# Patient Record
Sex: Male | Born: 1965 | Race: Black or African American | Hispanic: No | Marital: Married | Smoking: Current some day smoker
Health system: Southern US, Community
[De-identification: ages and names within clinical notes are randomized; demographics above are authoritative.]

## PROBLEM LIST (undated history)

## (undated) DIAGNOSIS — R9431 Abnormal electrocardiogram [ECG] [EKG]: Secondary | ICD-10-CM

## (undated) DIAGNOSIS — I10 Essential (primary) hypertension: Secondary | ICD-10-CM

## (undated) DIAGNOSIS — S8262XA Displaced fracture of lateral malleolus of left fibula, initial encounter for closed fracture: Secondary | ICD-10-CM

## (undated) DIAGNOSIS — I2510 Atherosclerotic heart disease of native coronary artery without angina pectoris: Secondary | ICD-10-CM

## (undated) DIAGNOSIS — I160 Hypertensive urgency: Secondary | ICD-10-CM

## (undated) DIAGNOSIS — I200 Unstable angina: Secondary | ICD-10-CM

## (undated) DIAGNOSIS — I249 Acute ischemic heart disease, unspecified: Secondary | ICD-10-CM

## (undated) DIAGNOSIS — Z720 Tobacco use: Secondary | ICD-10-CM

## (undated) DIAGNOSIS — G4730 Sleep apnea, unspecified: Secondary | ICD-10-CM

## (undated) DIAGNOSIS — R079 Chest pain, unspecified: Secondary | ICD-10-CM

## (undated) DIAGNOSIS — E7800 Pure hypercholesterolemia, unspecified: Secondary | ICD-10-CM

## (undated) DIAGNOSIS — E669 Obesity, unspecified: Secondary | ICD-10-CM

## (undated) DIAGNOSIS — E1169 Type 2 diabetes mellitus with other specified complication: Secondary | ICD-10-CM

## (undated) HISTORY — DX: Essential (primary) hypertension: I10

## (undated) HISTORY — DX: Acute ischemic heart disease, unspecified: I24.9

## (undated) HISTORY — DX: Tobacco use: Z72.0

## (undated) HISTORY — DX: Displaced fracture of lateral malleolus of left fibula, initial encounter for closed fracture: S82.62XA

## (undated) HISTORY — DX: Obesity, unspecified: E66.9

## (undated) HISTORY — DX: Hypertensive urgency: I16.0

## (undated) HISTORY — DX: Chest pain, unspecified: R07.9

## (undated) HISTORY — DX: Type 2 diabetes mellitus with other specified complication: E11.69

## (undated) HISTORY — DX: Abnormal electrocardiogram (ECG) (EKG): R94.31

## (undated) HISTORY — DX: Unstable angina: I20.0

## (undated) HISTORY — DX: Atherosclerotic heart disease of native coronary artery without angina pectoris: I25.10

---

## 2007-04-06 ENCOUNTER — Emergency Department (HOSPITAL_COMMUNITY): Admission: EM | Admit: 2007-04-06 | Discharge: 2007-04-06 | Payer: Self-pay | Admitting: Emergency Medicine

## 2009-02-18 ENCOUNTER — Encounter: Payer: Self-pay | Admitting: Cardiovascular Disease

## 2011-03-16 LAB — URINALYSIS, ROUTINE W REFLEX MICROSCOPIC
Bilirubin Urine: NEGATIVE
Glucose, UA: NEGATIVE
Hgb urine dipstick: NEGATIVE
Ketones, ur: NEGATIVE
Nitrite: NEGATIVE
Protein, ur: NEGATIVE
Specific Gravity, Urine: 1.018
Urobilinogen, UA: 1
pH: 7

## 2012-07-28 ENCOUNTER — Emergency Department (HOSPITAL_COMMUNITY)
Admission: EM | Admit: 2012-07-28 | Discharge: 2012-07-28 | Disposition: A | Discharge status: Home / Self Care | Payer: PRIVATE HEALTH INSURANCE | Attending: Emergency Medicine | Admitting: Emergency Medicine

## 2012-07-28 ENCOUNTER — Encounter (HOSPITAL_COMMUNITY): Payer: Self-pay

## 2012-07-28 ENCOUNTER — Emergency Department (HOSPITAL_COMMUNITY): Payer: PRIVATE HEALTH INSURANCE

## 2012-07-28 DIAGNOSIS — I10 Essential (primary) hypertension: Secondary | ICD-10-CM | POA: Insufficient documentation

## 2012-07-28 DIAGNOSIS — S82402A Unspecified fracture of shaft of left fibula, initial encounter for closed fracture: Secondary | ICD-10-CM

## 2012-07-28 DIAGNOSIS — E119 Type 2 diabetes mellitus without complications: Secondary | ICD-10-CM | POA: Insufficient documentation

## 2012-07-28 DIAGNOSIS — S82899A Other fracture of unspecified lower leg, initial encounter for closed fracture: Secondary | ICD-10-CM | POA: Insufficient documentation

## 2012-07-28 DIAGNOSIS — F17200 Nicotine dependence, unspecified, uncomplicated: Secondary | ICD-10-CM | POA: Insufficient documentation

## 2012-07-28 MED ORDER — OXYCODONE-ACETAMINOPHEN 5-325 MG PO TABS: 2.0000 | ORAL_TABLET | ORAL | Status: DC | PRN

## 2012-07-28 MED ORDER — ONDANSETRON HCL 4 MG/2ML IJ SOLN
4.0000 mg | Freq: Once | INTRAMUSCULAR | Status: AC
  Administered 2012-07-28: 4 mg via INTRAVENOUS
  Filled 2012-07-28: qty 2

## 2012-07-28 MED ORDER — HYDROMORPHONE HCL PF 1 MG/ML IJ SOLN
1.0000 mg | Freq: Once | INTRAMUSCULAR | Status: AC
  Administered 2012-07-28: 1 mg via INTRAVENOUS
  Filled 2012-07-28: qty 1

## 2012-07-28 MED ORDER — SODIUM CHLORIDE 0.9 % IV BOLUS (SEPSIS)
1000.0000 mL | Freq: Once | INTRAVENOUS | Status: AC
  Administered 2012-07-28: 1000 mL via INTRAVENOUS
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
## 2012-07-28 NOTE — ED Notes (Signed)
Ortho at bedside to apply posterior short leg splint.  Pt premedicated with 1mg  Dilaudid and 4mg  Zofran IV prior to procedure.

## 2012-07-28 NOTE — ED Notes (Signed)
X-ray at bedside

## 2012-07-28 NOTE — ED Notes (Signed)
Pt alert and oriented x4. Respirations even and unlabored. Bilateral rise and fall of chest. Skin warm and dry. In no acute distress. Denies needs.  

## 2012-07-28 NOTE — Discharge Instructions (Signed)
Cast or Splint Care Casts and splints support injured limbs and keep bones from moving while they heal.  HOME CARE  Keep the cast or splint uncovered during the drying period.  A plaster cast can take 24 to 48 hours to dry.  A fiberglass cast will dry in less than 1 hour.  Do not rest the cast on anything harder than a pillow for 24 hours.  Do not put weight on your injured limb. Do not put pressure on the cast. Wait for your doctor's approval.  Keep the cast or splint dry.  Cover the cast or splint with a plastic bag during baths or wet weather.  If you have a cast over your chest and belly (trunk), take sponge baths until the cast is taken off.  Keep your cast or splint clean. Wash a dirty cast with a damp cloth.  Do not put any objects under your cast or splint. Do not scratch the skin under the cast with an object.  Do not take out the padding from inside your cast.  Exercise your joints near the cast as told by your doctor.  Raise (elevate) your injured limb on 1 or 2 pillows for the first 1 to 3 days. GET HELP RIGHT AWAY IF:  Your cast or splint cracks.  Your cast or splint is too tight or too loose.  You itch badly under the cast.  Your cast gets wet or has a soft spot.  You have a bad smell coming from the cast.  You get an object stuck under the cast.  Your skin around the cast becomes red or raw.  You have new or more pain after the cast is put on.  You have fluid leaking through the cast.  You cannot move your fingers or toes.  Your fingers or toes turn colors or are cool, painful, or puffy (swollen).  You have tingling or lose feeling (numbness) around the injured area.  You have pain or pressure under the cast.  You have trouble breathing or have shortness of breath.  You have chest pain. MAKE SURE YOU:  Understand these instructions.  Will watch your condition.  Will get help right away if you are not doing well or get worse. Document  Released: 09/22/2010 Document Revised: 08/15/2011 Document Reviewed: 09/22/2010 [facility] Patient Information 2013 [place], [place].   Splint, crutches, elevation, ice, pain meds. Call the orthopedic surgeon within the week for a followup appointment. You may need surgery for your fracture

## 2012-07-28 NOTE — ED Notes (Signed)
Ortho Tech at bedside.  

## 2012-07-28 NOTE — ED Notes (Signed)
Bed:WA22<BR> Expected date:<BR> Expected time:<BR> Means of arrival:<BR> Comments:<BR> EMS

## 2012-08-02 ENCOUNTER — Other Ambulatory Visit (HOSPITAL_COMMUNITY): Payer: Self-pay | Admitting: Orthopaedic Surgery

## 2012-08-06 ENCOUNTER — Encounter (HOSPITAL_COMMUNITY): Payer: Self-pay | Admitting: *Deleted

## 2012-08-06 ENCOUNTER — Encounter (HOSPITAL_COMMUNITY): Payer: Self-pay | Admitting: Pharmacy Technician

## 2012-08-06 MED ORDER — CEFAZOLIN SODIUM-DEXTROSE 2-3 GM-% IV SOLR
2.0000 g | INTRAVENOUS | Status: AC
  Administered 2012-08-07: 2 g via INTRAVENOUS
  Filled 2012-08-06: qty 50

## 2012-08-07 ENCOUNTER — Encounter (HOSPITAL_COMMUNITY): Payer: Self-pay | Admitting: Anesthesiology

## 2012-08-07 ENCOUNTER — Encounter (HOSPITAL_COMMUNITY): Payer: Self-pay | Admitting: *Deleted

## 2012-08-07 ENCOUNTER — Ambulatory Visit (HOSPITAL_COMMUNITY): Payer: PRIVATE HEALTH INSURANCE

## 2012-08-07 ENCOUNTER — Encounter (HOSPITAL_COMMUNITY)
Admission: RE | Disposition: A | Discharge status: Home / Self Care | Payer: Self-pay | Source: Ambulatory Visit | Attending: Orthopaedic Surgery

## 2012-08-07 ENCOUNTER — Observation Stay (HOSPITAL_COMMUNITY)
Admission: RE | Admit: 2012-08-07 | Discharge: 2012-08-08 | Disposition: A | Discharge status: Home / Self Care | Payer: PRIVATE HEALTH INSURANCE | Source: Ambulatory Visit | Attending: Orthopaedic Surgery | Admitting: Orthopaedic Surgery

## 2012-08-07 ENCOUNTER — Ambulatory Visit (HOSPITAL_COMMUNITY): Payer: PRIVATE HEALTH INSURANCE | Admitting: Anesthesiology

## 2012-08-07 DIAGNOSIS — W19XXXA Unspecified fall, initial encounter: Secondary | ICD-10-CM | POA: Insufficient documentation

## 2012-08-07 DIAGNOSIS — S8262XA Displaced fracture of lateral malleolus of left fibula, initial encounter for closed fracture: Secondary | ICD-10-CM

## 2012-08-07 DIAGNOSIS — S82843A Displaced bimalleolar fracture of unspecified lower leg, initial encounter for closed fracture: Principal | ICD-10-CM | POA: Insufficient documentation

## 2012-08-07 DIAGNOSIS — E119 Type 2 diabetes mellitus without complications: Secondary | ICD-10-CM

## 2012-08-07 DIAGNOSIS — S93439A Sprain of tibiofibular ligament of unspecified ankle, initial encounter: Secondary | ICD-10-CM | POA: Insufficient documentation

## 2012-08-07 DIAGNOSIS — I10 Essential (primary) hypertension: Secondary | ICD-10-CM | POA: Insufficient documentation

## 2012-08-07 HISTORY — PX: ORIF ANKLE FRACTURE: SHX5408

## 2012-08-07 HISTORY — DX: Displaced fracture of lateral malleolus of left fibula, initial encounter for closed fracture: S82.62XA

## 2012-08-07 LAB — CBC
HCT: 39.4 % (ref 39.0–52.0)
Hemoglobin: 14 g/dL (ref 13.0–17.0)
MCH: 30.2 pg (ref 26.0–34.0)
MCHC: 35.5 g/dL (ref 30.0–36.0)
MCV: 84.9 fL (ref 78.0–100.0)
Platelets: 272 10*3/uL (ref 150–400)
RBC: 4.64 MIL/uL (ref 4.22–5.81)
RDW: 14.3 % (ref 11.5–15.5)
WBC: 10.7 10*3/uL — ABNORMAL HIGH (ref 4.0–10.5)

## 2012-08-07 LAB — SURGICAL PCR SCREEN
MRSA, PCR: NEGATIVE
Staphylococcus aureus: POSITIVE — AB

## 2012-08-07 LAB — GLUCOSE, CAPILLARY
Glucose-Capillary: 133 mg/dL — ABNORMAL HIGH (ref 70–99)
Glucose-Capillary: 103 mg/dL — ABNORMAL HIGH (ref 70–99)
Glucose-Capillary: 125 mg/dL — ABNORMAL HIGH (ref 70–99)

## 2012-08-07 LAB — BASIC METABOLIC PANEL
BUN: 16 mg/dL (ref 6–23)
CO2: 26 mEq/L (ref 19–32)
Calcium: 10.3 mg/dL (ref 8.4–10.5)
Chloride: 101 mEq/L (ref 96–112)
Creatinine, Ser: 1.04 mg/dL (ref 0.50–1.35)
GFR calc Af Amer: >90 mL/min (ref >90)
GFR calc non Af Amer: 84 mL/min — ABNORMAL LOW (ref >90)
Glucose, Bld: 134 mg/dL — ABNORMAL HIGH (ref 70–99)
Potassium: 4 mEq/L (ref 3.5–5.1)
Sodium: 139 mEq/L (ref 135–145)

## 2012-08-07 SURGERY — OPEN REDUCTION INTERNAL FIXATION (ORIF) ANKLE FRACTURE
Anesthesia: General | Site: Ankle | Laterality: Left | Wound class: Clean

## 2012-08-07 MED ORDER — BUPIVACAINE HCL (PF) 0.25 % IJ SOLN
INTRAMUSCULAR | Status: AC
  Filled 2012-08-07: qty 30

## 2012-08-07 MED ORDER — ONDANSETRON HCL 4 MG PO TABS: 4.0000 mg | ORAL_TABLET | Freq: Four times a day (QID) | ORAL | Status: DC | PRN

## 2012-08-07 MED ORDER — DIPHENHYDRAMINE HCL 12.5 MG/5ML PO ELIX
12.5000 mg | ORAL_SOLUTION | ORAL | Status: DC | PRN
  Filled 2012-08-07: qty 10

## 2012-08-07 MED ORDER — ZOLPIDEM TARTRATE 5 MG PO TABS: 5.0000 mg | ORAL_TABLET | Freq: Every evening | ORAL | Status: DC | PRN

## 2012-08-07 MED ORDER — SODIUM CHLORIDE 0.9 % IV SOLN
INTRAVENOUS | Status: DC
  Administered 2012-08-07: 20 mL/h via INTRAVENOUS

## 2012-08-07 MED ORDER — HYDROMORPHONE HCL PF 1 MG/ML IJ SOLN
INTRAMUSCULAR | Status: AC
  Filled 2012-08-07: qty 1

## 2012-08-07 MED ORDER — MUPIROCIN 2 % EX OINT
TOPICAL_OINTMENT | Freq: Two times a day (BID) | CUTANEOUS | Status: DC
  Administered 2012-08-07: 12:00:00 via NASAL

## 2012-08-07 MED ORDER — ONDANSETRON HCL 4 MG/2ML IJ SOLN: 4.0000 mg | Freq: Four times a day (QID) | INTRAMUSCULAR | Status: DC | PRN

## 2012-08-07 MED ORDER — HYDROCODONE-ACETAMINOPHEN 5-325 MG PO TABS
1.0000 | ORAL_TABLET | ORAL | Status: DC | PRN
  Administered 2012-08-08 (×2): 2 via ORAL
  Filled 2012-08-07 (×2): qty 2

## 2012-08-07 MED ORDER — PHENYLEPHRINE HCL 10 MG/ML IJ SOLN
INTRAMUSCULAR | Status: DC | PRN
  Administered 2012-08-07 (×6): 80 ug via INTRAVENOUS

## 2012-08-07 MED ORDER — BUPIVACAINE HCL (PF) 0.25 % IJ SOLN
INTRAMUSCULAR | Status: DC | PRN
  Administered 2012-08-07: 10 mL

## 2012-08-07 MED ORDER — GLYCOPYRROLATE 0.2 MG/ML IJ SOLN
INTRAMUSCULAR | Status: DC | PRN
  Administered 2012-08-07: 0.4 mg via INTRAVENOUS

## 2012-08-07 MED ORDER — METHOCARBAMOL 500 MG PO TABS: 500.0000 mg | ORAL_TABLET | Freq: Four times a day (QID) | ORAL | Status: DC | PRN

## 2012-08-07 MED ORDER — METHOCARBAMOL 500 MG PO TABS
ORAL_TABLET | ORAL | Status: AC
  Administered 2012-08-07: 500 mg
  Filled 2012-08-07: qty 1

## 2012-08-07 MED ORDER — OXYCODONE HCL 5 MG PO TABS
5.0000 mg | ORAL_TABLET | ORAL | Status: DC | PRN
  Administered 2012-08-08: 10 mg via ORAL
  Filled 2012-08-07: qty 2

## 2012-08-07 MED ORDER — LIDOCAINE HCL 4 % MT SOLN
OROMUCOSAL | Status: DC | PRN
  Administered 2012-08-07: 4 mL via TOPICAL

## 2012-08-07 MED ORDER — MUPIROCIN 2 % EX OINT
TOPICAL_OINTMENT | CUTANEOUS | Status: AC
  Filled 2012-08-07: qty 22

## 2012-08-07 MED ORDER — PROPOFOL 10 MG/ML IV BOLUS
INTRAVENOUS | Status: DC | PRN
  Administered 2012-08-07: 300 mg via INTRAVENOUS

## 2012-08-07 MED ORDER — AMLODIPINE BESYLATE 5 MG PO TABS: 5.0000 mg | ORAL_TABLET | Freq: Every day | ORAL | Status: DC

## 2012-08-07 MED ORDER — ASPIRIN EC 325 MG PO TBEC
325.0000 mg | DELAYED_RELEASE_TABLET | Freq: Every day | ORAL | Status: DC
  Administered 2012-08-08: 325 mg via ORAL
  Filled 2012-08-07: qty 1

## 2012-08-07 MED ORDER — 0.9 % SODIUM CHLORIDE (POUR BTL) OPTIME
TOPICAL | Status: DC | PRN
  Administered 2012-08-07: 1000 mL

## 2012-08-07 MED ORDER — LACTATED RINGERS IV SOLN
INTRAVENOUS | Status: DC
  Administered 2012-08-07 (×4): via INTRAVENOUS

## 2012-08-07 MED ORDER — NEOSTIGMINE METHYLSULFATE 1 MG/ML IJ SOLN
INTRAMUSCULAR | Status: DC | PRN
  Administered 2012-08-07: 3 mg via INTRAVENOUS

## 2012-08-07 MED ORDER — FENTANYL CITRATE 0.05 MG/ML IJ SOLN
INTRAMUSCULAR | Status: DC | PRN
  Administered 2012-08-07: 50 ug via INTRAVENOUS
  Administered 2012-08-07: 150 ug via INTRAVENOUS
  Administered 2012-08-07: 50 ug via INTRAVENOUS
  Administered 2012-08-07 (×2): 100 ug via INTRAVENOUS
  Administered 2012-08-07: 50 ug via INTRAVENOUS
  Administered 2012-08-07: 100 ug via INTRAVENOUS

## 2012-08-07 MED ORDER — METOCLOPRAMIDE HCL 10 MG PO TABS: 5.0000 mg | ORAL_TABLET | Freq: Three times a day (TID) | ORAL | Status: DC | PRN

## 2012-08-07 MED ORDER — MIDAZOLAM HCL 5 MG/5ML IJ SOLN
INTRAMUSCULAR | Status: DC | PRN
  Administered 2012-08-07: 2 mg via INTRAVENOUS

## 2012-08-07 MED ORDER — ONDANSETRON HCL 4 MG/2ML IJ SOLN
INTRAMUSCULAR | Status: DC | PRN
  Administered 2012-08-07: 4 mg via INTRAVENOUS

## 2012-08-07 MED ORDER — ONDANSETRON HCL 4 MG/2ML IJ SOLN: 4.0000 mg | Freq: Once | INTRAMUSCULAR | Status: DC | PRN

## 2012-08-07 MED ORDER — HYDROMORPHONE HCL PF 1 MG/ML IJ SOLN
0.2500 mg | INTRAMUSCULAR | Status: DC | PRN
  Administered 2012-08-07 (×3): 0.5 mg via INTRAVENOUS

## 2012-08-07 MED ORDER — METOCLOPRAMIDE HCL 5 MG/ML IJ SOLN
5.0000 mg | Freq: Three times a day (TID) | INTRAMUSCULAR | Status: DC | PRN
  Filled 2012-08-07: qty 2

## 2012-08-07 MED ORDER — MORPHINE SULFATE 2 MG/ML IJ SOLN
1.0000 mg | INTRAMUSCULAR | Status: DC | PRN
  Administered 2012-08-07: 1 mg via INTRAVENOUS
  Filled 2012-08-07: qty 1

## 2012-08-07 MED ORDER — ACETAMINOPHEN 10 MG/ML IV SOLN: 1000.0000 mg | Freq: Once | INTRAVENOUS | Status: DC

## 2012-08-07 MED ORDER — ACETAMINOPHEN 10 MG/ML IV SOLN
INTRAVENOUS | Status: AC
  Administered 2012-08-07: 1000 mg via INTRAVENOUS
  Filled 2012-08-07: qty 100

## 2012-08-07 MED ORDER — LIDOCAINE HCL 1 % IJ SOLN
INTRAMUSCULAR | Status: DC | PRN
  Administered 2012-08-07: 100 mg via INTRADERMAL

## 2012-08-07 MED ORDER — METHOCARBAMOL 100 MG/ML IJ SOLN: 500.0000 mg | Freq: Four times a day (QID) | INTRAVENOUS | Status: DC | PRN

## 2012-08-07 MED ORDER — METOPROLOL TARTRATE 1 MG/ML IV SOLN
INTRAVENOUS | Status: DC | PRN
  Administered 2012-08-07: 3 mg via INTRAVENOUS
  Administered 2012-08-07: 2 mg via INTRAVENOUS

## 2012-08-07 MED ORDER — HYDROCODONE-ACETAMINOPHEN 5-325 MG PO TABS
ORAL_TABLET | ORAL | Status: AC
  Administered 2012-08-07: 1
  Filled 2012-08-07: qty 1

## 2012-08-07 MED ORDER — ROCURONIUM BROMIDE 100 MG/10ML IV SOLN
INTRAVENOUS | Status: DC | PRN
  Administered 2012-08-07: 50 mg via INTRAVENOUS

## 2012-08-07 MED ORDER — CEFAZOLIN SODIUM 1-5 GM-% IV SOLN
1.0000 g | Freq: Four times a day (QID) | INTRAVENOUS | Status: AC
  Administered 2012-08-07 – 2012-08-08 (×3): 1 g via INTRAVENOUS
  Filled 2012-08-07 (×3): qty 50

## 2012-08-07 SURGICAL SUPPLY — 64 items
BANDAGE ELASTIC 4 VELCRO ST LF (GAUZE/BANDAGES/DRESSINGS) ×2 IMPLANT
BANDAGE ELASTIC 6 VELCRO ST LF (GAUZE/BANDAGES/DRESSINGS) ×2 IMPLANT
BANDAGE ESMARK 6X9 LF (GAUZE/BANDAGES/DRESSINGS) IMPLANT
BIT DRILL 2.5X2.75 QC CALB (BIT) ×2 IMPLANT
BIT DRILL 3.5X5.5 QC CALB (BIT) ×2 IMPLANT
BNDG ESMARK 6X9 LF (GAUZE/BANDAGES/DRESSINGS)
CANISTER SUCTION 2500CC (MISCELLANEOUS) ×2 IMPLANT
CLOTH BEACON ORANGE TIMEOUT ST (SAFETY) ×2 IMPLANT
COVER SURGICAL LIGHT HANDLE (MISCELLANEOUS) ×2 IMPLANT
CUFF TOURNIQUET SINGLE 34IN LL (TOURNIQUET CUFF) IMPLANT
CUFF TOURNIQUET SINGLE 44IN (TOURNIQUET CUFF) IMPLANT
DRAPE C-ARM 42X72 X-RAY (DRAPES) ×2 IMPLANT
DRAPE OEC MINIVIEW 54X84 (DRAPES) ×2 IMPLANT
DRAPE ORTHO SPLIT 77X108 STRL (DRAPES) ×4
DRAPE PROXIMA HALF (DRAPES) ×4 IMPLANT
DRAPE SURG ORHT 6 SPLT 77X108 (DRAPES) ×2 IMPLANT
DRAPE U-SHAPE 47X51 STRL (DRAPES) ×2 IMPLANT
DRSG PAD ABDOMINAL 8X10 ST (GAUZE/BANDAGES/DRESSINGS) ×2 IMPLANT
DURAPREP 26ML APPLICATOR (WOUND CARE) ×2 IMPLANT
ELECT REM PT RETURN 9FT ADLT (ELECTROSURGICAL) ×2
ELECTRODE REM PT RTRN 9FT ADLT (ELECTROSURGICAL) ×1 IMPLANT
GAUZE XEROFORM 1X8 LF (GAUZE/BANDAGES/DRESSINGS) ×2 IMPLANT
GAUZE XEROFORM 5X9 LF (GAUZE/BANDAGES/DRESSINGS) ×2 IMPLANT
GLOVE BIO SURGEON STRL SZ7.5 (GLOVE) ×2 IMPLANT
GLOVE ORTHO TXT STRL SZ7.5 (GLOVE) ×2 IMPLANT
GOWN PREVENTION PLUS LG XLONG (DISPOSABLE) IMPLANT
GOWN PREVENTION PLUS XLARGE (GOWN DISPOSABLE) ×4 IMPLANT
GOWN STRL NON-REIN LRG LVL3 (GOWN DISPOSABLE) ×4 IMPLANT
K-WIRE ACE 1.6X6 (WIRE) ×4
KIT BASIN OR (CUSTOM PROCEDURE TRAY) ×2 IMPLANT
KIT ROOM TURNOVER OR (KITS) ×2 IMPLANT
KWIRE ACE 1.6X6 (WIRE) ×2 IMPLANT
MANIFOLD NEPTUNE II (INSTRUMENTS) IMPLANT
NEEDLE HYPO 25GX1X1/2 BEV (NEEDLE) ×2 IMPLANT
NS IRRIG 1000ML POUR BTL (IV SOLUTION) ×2 IMPLANT
PACK ORTHO EXTREMITY (CUSTOM PROCEDURE TRAY) ×2 IMPLANT
PAD ARMBOARD 7.5X6 YLW CONV (MISCELLANEOUS) ×4 IMPLANT
PAD CAST 4YDX4 CTTN HI CHSV (CAST SUPPLIES) ×1 IMPLANT
PADDING CAST COTTON 4X4 STRL (CAST SUPPLIES) ×2
PADDING CAST COTTON 6X4 STRL (CAST SUPPLIES) ×2 IMPLANT
PLATE LOCK 8H 103 BILAT FIB (Plate) ×2 IMPLANT
PREFILTER NEPTUNE (MISCELLANEOUS) ×2 IMPLANT
SCREW CORT T15 TPR 55X3.5XST (Screw) ×1 IMPLANT
SCREW CORTICAL 3.5MM  20MM (Screw) ×2 IMPLANT
SCREW CORTICAL 3.5MM 20MM (Screw) ×1 IMPLANT
SCREW CORTICAL 3.5X55MM (Screw) ×2 IMPLANT
SCREW LOCK CORT STAR 3.5X14 (Screw) ×4 IMPLANT
SCREW LP 3.5 (Screw) ×2 IMPLANT
SCREW NON LOCKING LP 3.5 14MM (Screw) ×2 IMPLANT
SCREW NON LOCKING LP 3.5 16MM (Screw) ×4 IMPLANT
SPLINT PLASTER CAST XFAST 5X30 (CAST SUPPLIES) ×1 IMPLANT
SPLINT PLASTER XFAST SET 5X30 (CAST SUPPLIES) ×1
SPONGE GAUZE 4X4 12PLY (GAUZE/BANDAGES/DRESSINGS) ×2 IMPLANT
SPONGE LAP 4X18 X RAY DECT (DISPOSABLE) ×4 IMPLANT
SUCTION FRAZIER TIP 10 FR DISP (SUCTIONS) ×2 IMPLANT
SUT ETHILON 2 0 FS 18 (SUTURE) ×2 IMPLANT
SUT VIC AB 2-0 CT1 27 (SUTURE) ×2
SUT VIC AB 2-0 CT1 27XBRD (SUTURE) ×1 IMPLANT
SUT VICRYL 0 CT 1 36IN (SUTURE) ×2 IMPLANT
SYR CONTROL 10ML LL (SYRINGE) ×2 IMPLANT
TOWEL OR 17X24 6PK STRL BLUE (TOWEL DISPOSABLE) ×2 IMPLANT
TOWEL OR 17X26 10 PK STRL BLUE (TOWEL DISPOSABLE) ×2 IMPLANT
TUBE CONNECTING 12X1/4 (SUCTIONS) ×2 IMPLANT
WATER STERILE IRR 1000ML POUR (IV SOLUTION) ×2 IMPLANT

## 2012-08-07 NOTE — Discharge Instructions (Signed)
Keep your left ankle splint clean and dry. No weight on your left ankle at all. Elevation for swelling

## 2012-08-07 NOTE — Anesthesia Postprocedure Evaluation (Signed)
  Anesthesia Post-op Note   Patient: Clifford Frederick  Procedure(s) Performed: Procedure(s): OPEN REDUCTION INTERNAL FIXATION (ORIF) LEFT ANKLE FRACTURE (Left)  Patient Location: PACU  Anesthesia Type:General  Level of Consciousness: awake  Airway and Oxygen Therapy: Patient Spontanous Breathing  Post-op Pain: mild  Post-op Assessment: Post-op Vital signs reviewed, Patient's Cardiovascular Status Stable, Respiratory Function Stable, Patent Airway, No signs of Nausea or vomiting and Pain level controlled  Post-op Vital Signs: stable  Complications: No apparent anesthesia complications

## 2012-08-07 NOTE — Anesthesia Preprocedure Evaluation (Addendum)
Anesthesia Evaluation  Patient identified by MRN, date of birth, ID band Patient awake    Reviewed: Allergy & Precautions, H&P , NPO status , Patient's Chart, lab work & pertinent test results  History of Anesthesia Complications Negative for: history of anesthetic complications  Airway Mallampati: I TM Distance: >3 FB Neck ROM: full    Dental  (+) Poor Dentition, Chipped, Dental Advisory Given and Loose,    Pulmonary Current Smoker,          Cardiovascular hypertension (noncompliant with meds), Rhythm:regular Rate:Normal     Neuro/Psych negative neurological ROS  negative psych ROS   GI/Hepatic negative GI ROS, Neg liver ROS,   Endo/Other  diabetes, Type 2, Oral Hypoglycemic AgentsMorbid obesity  Renal/GU negative Renal ROS  negative genitourinary   Musculoskeletal   Abdominal   Peds negative pediatric ROS (+)  Hematology negative hematology ROS (+)   Anesthesia Other Findings   Reproductive/Obstetrics negative OB ROS                         Anesthesia Physical Anesthesia Plan  ASA: III  Anesthesia Plan: General   Post-op Pain Management:    Induction: Intravenous  Airway Management Planned: Oral ETT and LMA  Additional Equipment:   Intra-op Plan:   Post-operative Plan: Extubation in OR  Informed Consent: I have reviewed the patients History and Physical, chart, labs and discussed the procedure including the risks, benefits and alternatives for the proposed anesthesia with the patient or authorized representative who has indicated his/her understanding and acceptance.     Plan Discussed with: CRNA, Surgeon and Anesthesiologist  Anesthesia Plan Comments:         Anesthesia Quick Evaluation

## 2012-08-07 NOTE — H&P (Signed)
  We do recognize his hypertension/high blood pressure issues.  Given the severe nature of his fracture with significant displacement causing tension on the soft-tissues, I do fell that we need to proceed with surgery.  He is almost 2 weeks out now from his injury.  Apparently, he has been somewhat non-compliant with his BP meds.  He understands the problems this can create with damage to his kidneys or even a stroke.  We will proceed with surgery today and work on pain conrtol to keep his pressure down, but we will also admit him after surgery to closely follow his blood pressure.

## 2012-08-07 NOTE — Progress Notes (Signed)
Jimmye Norman RN notified Dr.Freeman Jean Rosenthal of pt B/P 173/124.  Rechecked 161/112--Dr.Jackson states unless this is an emergency case then pt needs to be tuned up.Spoke with Dr.C Magnus Ivan and pt needs to be done and will go and talk with Dr.Jackson

## 2012-08-07 NOTE — Preoperative (Signed)
Beta Blockers   Reason not to administer Beta Blockers:Not Applicable 

## 2012-08-07 NOTE — Brief Op Note (Signed)
08/07/2012  7:10 PM  PATIENT:  Clifford Frederick  47 y.o. male  PRE-OPERATIVE DIAGNOSIS:  unstable left ankle lateral malleolus fracture  POST-OPERATIVE DIAGNOSIS:  unstable left bimalleolar ankle fracture with lateral malleolus fracture, posterior malleolus fracture and syndesmosis disruption  PROCEDURE:  Procedure(s): OPEN REDUCTION INTERNAL FIXATION (ORIF) LEFT ANKLE FRACTURE (Left) LATERAL MALLEOLUS AND SYNDESMOSIS  SURGEON:  Surgeon(s) and Role:    * Kathryne Hitch, MD - Primary  PHYSICIAN ASSISTANT:   ASSISTANTS: Rexene Edison, PA-C   ANESTHESIA:   local and general  EBL:     BLOOD ADMINISTERED:none  DRAINS: none   LOCAL MEDICATIONS USED:  MARCAINE     SPECIMEN:  No Specimen  DISPOSITION OF SPECIMEN:  N/A  COUNTS:  YES  TOURNIQUET:  * Missing tourniquet times found for documented tourniquets in log:  16109 *  DICTATION: .Other Dictation: Dictation Number [id number]  PLAN OF CARE: Admit for overnight observation  PATIENT DISPOSITION:  PACU - hemodynamically stable.   Delay start of Pharmacological VTE agent (>24hrs) due to surgical blood loss or risk of bleeding: no
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
## 2012-08-07 NOTE — Anesthesia Procedure Notes (Signed)
Procedure Name: Intubation Date/Time: 08/07/2012 5:51 PM Performed by: Leona Singleton A Pre-anesthesia Checklist: Patient identified Patient Re-evaluated:Patient Re-evaluated prior to inductionOxygen Delivery Method: Circle system utilized Preoxygenation: Pre-oxygenation with 100% oxygen Intubation Type: IV induction Ventilation: Two handed mask ventilation required Laryngoscope Size: Miller and 2 Grade View: Grade I Tube type: Oral Tube size: 7.5 mm Number of attempts: 1 Airway Equipment and Method: Stylet and LTA kit utilized Placement Confirmation: ETT inserted through vocal cords under direct vision,  positive ETCO2 and breath sounds checked- equal and bilateral Secured at: 23 cm Tube secured with: Tape Dental Injury: Teeth and Oropharynx as per pre-operative assessment

## 2012-08-07 NOTE — Transfer of Care (Signed)
Immediate Anesthesia Transfer of Care Note  Patient: Clifford Frederick  Procedure(s) Performed: Procedure(s): OPEN REDUCTION INTERNAL FIXATION (ORIF) LEFT ANKLE FRACTURE (Left)  Patient Location: PACU  Anesthesia Type:General  Level of Consciousness: awake, alert  and oriented  Airway & Oxygen Therapy: Patient Spontanous Breathing and Patient connected to nasal cannula oxygen  Post-op Assessment: Report given to PACU RN and Post -op Vital signs reviewed and stable  Post vital signs: Reviewed and stable  Complications: No apparent anesthesia complications

## 2012-08-08 ENCOUNTER — Encounter (HOSPITAL_COMMUNITY): Payer: Self-pay | Admitting: Orthopaedic Surgery

## 2012-08-08 DIAGNOSIS — I10 Essential (primary) hypertension: Secondary | ICD-10-CM

## 2012-08-08 DIAGNOSIS — E119 Type 2 diabetes mellitus without complications: Secondary | ICD-10-CM

## 2012-08-08 HISTORY — DX: Essential (primary) hypertension: I10

## 2012-08-08 LAB — GLUCOSE, CAPILLARY
Glucose-Capillary: 162 mg/dL — ABNORMAL HIGH (ref 70–99)
Glucose-Capillary: 128 mg/dL — ABNORMAL HIGH (ref 70–99)

## 2012-08-08 LAB — HEMOGLOBIN A1C
Hgb A1c MFr Bld: 7.7 % — ABNORMAL HIGH (ref <5.7)
Mean Plasma Glucose: 174 mg/dL — ABNORMAL HIGH (ref <117)

## 2012-08-08 MED ORDER — OXYCODONE-ACETAMINOPHEN 5-325 MG PO TABS: 1.0000 | ORAL_TABLET | ORAL | Status: DC | PRN

## 2012-08-08 MED ORDER — CHLORHEXIDINE GLUCONATE CLOTH 2 % EX PADS: 6.0000 | MEDICATED_PAD | Freq: Every day | CUTANEOUS | Status: DC

## 2012-08-08 MED ORDER — METFORMIN HCL 1000 MG PO TABS: 1000.0000 mg | ORAL_TABLET | Freq: Every day | ORAL | Status: DC

## 2012-08-08 MED ORDER — HYDRALAZINE HCL 20 MG/ML IJ SOLN
10.0000 mg | INTRAMUSCULAR | Status: DC | PRN
  Administered 2012-08-08: 10 mg via INTRAVENOUS
  Filled 2012-08-08: qty 0.5

## 2012-08-08 MED ORDER — ASPIRIN 325 MG PO TBEC: 325.0000 mg | DELAYED_RELEASE_TABLET | Freq: Every day | ORAL | Status: DC

## 2012-08-08 MED ORDER — LISINOPRIL 20 MG PO TABS: 20.0000 mg | ORAL_TABLET | Freq: Every day | ORAL | Status: DC

## 2012-08-08 MED ORDER — BIOTENE DRY MOUTH MT LIQD
15.0000 mL | Freq: Two times a day (BID) | OROMUCOSAL | Status: DC
  Administered 2012-08-08 (×2): 15 mL via OROMUCOSAL

## 2012-08-08 MED ORDER — INSULIN ASPART 100 UNIT/ML ~~LOC~~ SOLN
0.0000 [IU] | Freq: Three times a day (TID) | SUBCUTANEOUS | Status: DC
  Administered 2012-08-08: 1 [IU] via SUBCUTANEOUS
  Administered 2012-08-08: 2 [IU] via SUBCUTANEOUS

## 2012-08-08 MED ORDER — METHOCARBAMOL 500 MG PO TABS: 500.0000 mg | ORAL_TABLET | Freq: Four times a day (QID) | ORAL | Status: DC | PRN

## 2012-08-08 MED ORDER — AMLODIPINE BESYLATE 5 MG PO TABS
5.0000 mg | ORAL_TABLET | Freq: Once | ORAL | Status: AC
  Administered 2012-08-08: 5 mg via ORAL
  Filled 2012-08-08: qty 1

## 2012-08-08 MED ORDER — AMLODIPINE BESYLATE 5 MG PO TABS: 5.0000 mg | ORAL_TABLET | Freq: Every day | ORAL | Status: DC

## 2012-08-08 MED ORDER — LISINOPRIL 10 MG PO TABS
10.0000 mg | ORAL_TABLET | Freq: Every day | ORAL | Status: DC
  Administered 2012-08-08: 10 mg via ORAL
  Filled 2012-08-08: qty 1

## 2012-08-08 MED ORDER — MUPIROCIN 2 % EX OINT
1.0000 "application " | TOPICAL_OINTMENT | Freq: Two times a day (BID) | CUTANEOUS | Status: DC
  Administered 2012-08-08 (×2): 1 via NASAL
  Filled 2012-08-08: qty 22

## 2012-08-08 MED ORDER — LISINOPRIL 10 MG PO TABS: 10.0000 mg | ORAL_TABLET | Freq: Every day | ORAL | Status: DC

## 2012-08-08 NOTE — Progress Notes (Signed)
Pt b/p is still high 170/108. Pt given PRN b/p med- will recheck in 45 mins.

## 2012-08-08 NOTE — Progress Notes (Signed)
Patient discharge teaching given, including activity, diet, follow-up appoints, and medications. Patient verbalized understanding of all discharge instructions. IV access was d/c'd. Vitals are stable. Skin is intact except as charted in most recent assessments. Pt to be escorted out by NT, to be driven home by family. 

## 2012-08-08 NOTE — Progress Notes (Signed)
Pt b/p 177/103. Previous RN called doctor to have amlodipine ordered. RN will give med to pt when med is received from pharm.

## 2012-08-08 NOTE — Progress Notes (Addendum)
Subjective: 1 Day Post-Op Procedure(s) (LRB): OPEN REDUCTION INTERNAL FIXATION (ORIF) LEFT ANKLE FRACTURE (Left) Patient reports pain as moderate.   Otherwise doing well. Objective: Vital signs in last 24 hours: Temp:  [97.7 F (36.5 C)-98.8 F (37.1 C)] 98.8 F (37.1 C) (03/05 0500) Pulse Rate:  [89-120] 94 (03/05 0500) Resp:  [11-20] 18 (03/05 0500) BP: (161-186)/(89-124) 175/95 mmHg (03/05 0500) SpO2:  [96 %-100 %] 99 % (03/05 0500) Weight:  [107.502 kg (237 lb)-115 kg (253 lb 8.5 oz)] 115 kg (253 lb 8.5 oz) (03/04 2150)  Intake/Output from previous day: 03/04 0701 - 03/05 0700 In: 2770 [P.O.:520; I.V.:2250] Out: 950 [Urine:950] Intake/Output this shift: Total I/O In: -  Out: 800 [Urine:800]   Recent Labs  08/07/12 1215  HGB 14.0    Recent Labs  08/07/12 1215  WBC 10.7*  RBC 4.64  HCT 39.4  PLT 272    Recent Labs  08/07/12 1215  NA 139  K 4.0  CL 101  CO2 26  BUN 16  CREATININE 1.04  GLUCOSE 134*  CALCIUM 10.3   No results found for this basename: LABPT, INR,  in the last 72 hours  Left lower leg: Sensation intact distally Incision: dressing C/D/I Able to wiggle toes without any increase in pain   Assessment/Plan: 1 Day Post-Op Procedure(s) (LRB): OPEN REDUCTION INTERNAL FIXATION (ORIF) LEFT ANKLE FRACTURE (Left) HTN per medicine DM2 awaiting HgA1C Discharge to home after gait training with PT and recommendations from Medical team in regards to HTN and DM2. Thanks to medicine for help with management of patient. Clifford Frederick 08/08/2012, 8:13 AM Patient to be discharged to home with Lisinopril, Amlodipine , and metformin as recommended by medicine. Will follow-up with Dr. Link Snuffer as out patient.

## 2012-08-08 NOTE — Care Management Note (Signed)
    Page 1 of 1   08/08/2012     11:22:02 AM   CARE MANAGEMENT NOTE 08/08/2012  Patient:  Clifford Frederick, Clifford Frederick   Account Number:  [id number]  Date Initiated:  08/08/2012  Documentation initiated by:  Letha Cape  Subjective/Objective Assessment:   dx fx of lateral malleolus  admit as observation- lives with spouse.     Action/Plan:   pt eval- no pt needs.   Anticipated DC Date:  08/09/2012   Anticipated DC Plan:  HOME/SELF CARE      DC Planning Services  CM consult      Choice offered to / List presented to:             Status of service:  Completed, signed off Medicare Important Message given?   (If response is "NO", the following Medicare IM given date fields will be blank) Date Medicare IM given:   Date Additional Medicare IM given:    Discharge Disposition:  HOME/SELF CARE  Per UR Regulation:  Reviewed for med. necessity/level of care/duration of stay  If discussed at Long Length of Stay Meetings, dates discussed:    Comments:  08/08/12 9:03 Letha Cape RN, BSN [phone] patient lives with spouse, pta indep.   Patient states he would like to see Ivery Quale for his PCP because his wife see this MD as well.  I call Dr. Norval Gable office at [phone] left message for them to call me back with appt information if this PCP is taking new patients.  Awaiting call from potential PCP.  Per physical therapy patient has no pt needs.
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
## 2012-08-08 NOTE — Progress Notes (Signed)
Pt b/p is now 175/95. Contacting night coverage.
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
## 2012-08-08 NOTE — Discharge Summary (Signed)
Physician Discharge Summary  Patient ID: Clifford Frederick MRN: [id number] DOB/AGE: Dec 27, 1965 47 y.o.  Admit date: 08/07/2012 Discharge date: 08/08/2012  Admission Diagnoses: Left ankle lateral malleolus fracture  Discharge Diagnoses:  Principal Problem:   Fracture of lateral malleolus of left ankle Active Problems:   Essential hypertension, benign   Diabetes mellitus   Discharged Condition:on:Stable   Hospital Course: Patient admitted for ORIF left lateral malleolus fracture. Patient found to be non compliant with management of diabetes and HTN . Therefore hospitalitis consulted for management of these medical issues. Otherwise hospital uncomplicated.  Consults:Hospitalist AND PT   Significant Diagnostic Studies: HgbA1C 7.7  Treatments:ORIF OF LEFT ankle fracture, blood pressure and diabetes medication started   Discharge Exam: Blood pressure 167/111, pulse 98, temperature 98.8 F (37.1 C), temperature source Oral, resp. rate 18, height 5\' 10"  (1.778 m), weight 115 kg (253 lb 8.5 oz), SpO2 98.00%.  Physical exam: General: patient awake , alert and in no acute distress Left lower leg: dressing clean dry and intact Toes warm dry and sensation intact   Disposition: 01-Home or Self Care     Medication List    TAKE these medications       amLODipine 5 MG tablet  Commonly known as:  NORVASC  Take 1 tablet (5 mg total) by mouth daily.     aspirin 325 MG EC tablet  Take 1 tablet (325 mg total) by mouth daily.     lisinopril 20 MG tablet  Commonly known as:  PRINIVIL,ZESTRIL  Take 1 tablet (20 mg total) by mouth daily.     metFORMIN 1000 MG tablet  Commonly known as:  GLUCOPHAGE  Take 1 tablet (1,000 mg total) by mouth daily with breakfast.     methocarbamol 500 MG tablet  Commonly known as:  ROBAXIN  Take 1 tablet (500 mg total) by mouth every 6 (six) hours as needed.     oxyCODONE-acetaminophen 5-325 MG per tablet  Commonly known as:  PERCOCET  Take 2 tablets by  mouth every 4 (four) hours as needed for pain.     oxyCODONE-acetaminophen 5-325 MG per tablet  Commonly known as:  ROXICET  Take 1-2 tablets by mouth every 4 (four) hours as needed for pain.           Follow-up Information   Follow up with Kathryne Hitch, MD In 2 weeks.   Contact information:   [address] [place] [address] [phone]       Follow up with Alysia Penna, MD On 08/14/2012. (PCP-1:45, bring insurance card and photo id, this first visit is a office visit not a physical visit.please fill out the invitation on your e-mail and put a check at the end  of  each question so that information will transfer over to MD 's office.)    Contact information:   [address] [place] [address] [phone]       Signed: Richardean Canal 08/08/2012, 1:37 PM

## 2012-08-08 NOTE — Progress Notes (Addendum)
   Seen earlier today by my colleague Dr. Toniann Fail  Patient seen and examined, chart and data base reviewed.  Patient admitted by Dr. Rayburn Ma of the orthopedic service for left ankle fracture.  [facility] hospitalists consulted for diabetes and hypertension management.  Patient was been before on lisinopril and metformin, likely to restart previous home medications.  Recommend following:  Lisinopril 20 mg PO daily  Amlodipine 5 mg PO daily.  Metformin 1000 mg PO daily.   Clint Lipps Pager: [phone] 08/08/2012, 11:07 AM

## 2013-12-23 ENCOUNTER — Emergency Department (HOSPITAL_COMMUNITY)
Admission: EM | Admit: 2013-12-23 | Discharge: 2013-12-23 | Disposition: A | Discharge status: Home / Self Care | Payer: PRIVATE HEALTH INSURANCE | Attending: Emergency Medicine | Admitting: Emergency Medicine

## 2013-12-23 ENCOUNTER — Encounter (HOSPITAL_COMMUNITY): Payer: Self-pay | Admitting: Emergency Medicine

## 2013-12-23 ENCOUNTER — Emergency Department (HOSPITAL_COMMUNITY): Payer: PRIVATE HEALTH INSURANCE

## 2013-12-23 DIAGNOSIS — R072 Precordial pain: Secondary | ICD-10-CM | POA: Insufficient documentation

## 2013-12-23 DIAGNOSIS — I10 Essential (primary) hypertension: Secondary | ICD-10-CM

## 2013-12-23 DIAGNOSIS — R229 Localized swelling, mass and lump, unspecified: Secondary | ICD-10-CM | POA: Insufficient documentation

## 2013-12-23 DIAGNOSIS — F17200 Nicotine dependence, unspecified, uncomplicated: Secondary | ICD-10-CM | POA: Insufficient documentation

## 2013-12-23 DIAGNOSIS — R209 Unspecified disturbances of skin sensation: Secondary | ICD-10-CM | POA: Insufficient documentation

## 2013-12-23 DIAGNOSIS — R202 Paresthesia of skin: Secondary | ICD-10-CM

## 2013-12-23 DIAGNOSIS — E099 Drug or chemical induced diabetes mellitus without complications: Secondary | ICD-10-CM

## 2013-12-23 DIAGNOSIS — E139 Other specified diabetes mellitus without complications: Secondary | ICD-10-CM | POA: Insufficient documentation

## 2013-12-23 DIAGNOSIS — R51 Headache: Secondary | ICD-10-CM | POA: Insufficient documentation

## 2013-12-23 HISTORY — DX: Pure hypercholesterolemia, unspecified: E78.00

## 2013-12-23 HISTORY — DX: Sleep apnea, unspecified: G47.30

## 2013-12-23 LAB — COMPREHENSIVE METABOLIC PANEL
ALT: 27 U/L (ref 0–53)
AST: 24 U/L (ref 0–37)
Albumin: 4 g/dL (ref 3.5–5.2)
Alkaline Phosphatase: 58 U/L (ref 39–117)
Anion gap: 14 (ref 5–15)
BUN: 14 mg/dL (ref 6–23)
CO2: 24 mEq/L (ref 19–32)
Calcium: 9.8 mg/dL (ref 8.4–10.5)
Chloride: 101 mEq/L (ref 96–112)
Creatinine, Ser: 1.01 mg/dL (ref 0.50–1.35)
GFR calc Af Amer: >90 mL/min (ref >90)
GFR calc non Af Amer: 87 mL/min — ABNORMAL LOW (ref >90)
Glucose, Bld: 209 mg/dL — ABNORMAL HIGH (ref 70–99)
Potassium: 4 mEq/L (ref 3.7–5.3)
Sodium: 139 mEq/L (ref 137–147)
Total Bilirubin: 0.6 mg/dL (ref 0.3–1.2)
Total Protein: 7.5 g/dL (ref 6.0–8.3)

## 2013-12-23 LAB — I-STAT TROPONIN, ED: Troponin i, poc: 0 ng/mL (ref 0.00–0.08)

## 2013-12-23 LAB — CBC WITH DIFFERENTIAL/PLATELET
Basophils Absolute: 0 10*3/uL (ref 0.0–0.1)
Basophils Relative: 0 % (ref 0–1)
Eosinophils Absolute: 0.1 10*3/uL (ref 0.0–0.7)
Eosinophils Relative: 1 % (ref 0–5)
HCT: 42.2 % (ref 39.0–52.0)
Hemoglobin: 14.3 g/dL (ref 13.0–17.0)
Lymphocytes Relative: 30 % (ref 12–46)
Lymphs Abs: 2.9 10*3/uL (ref 0.7–4.0)
MCH: 29.1 pg (ref 26.0–34.0)
MCHC: 33.9 g/dL (ref 30.0–36.0)
MCV: 85.8 fL (ref 78.0–100.0)
Monocytes Absolute: 0.5 10*3/uL (ref 0.1–1.0)
Monocytes Relative: 6 % (ref 3–12)
Neutro Abs: 6.3 10*3/uL (ref 1.7–7.7)
Neutrophils Relative %: 63 % (ref 43–77)
Platelets: 221 10*3/uL (ref 150–400)
RBC: 4.92 MIL/uL (ref 4.22–5.81)
RDW: 14.8 % (ref 11.5–15.5)
WBC: 9.8 10*3/uL (ref 4.0–10.5)

## 2013-12-23 LAB — CBG MONITORING, ED: Glucose-Capillary: 197 mg/dL — ABNORMAL HIGH (ref 70–99)

## 2013-12-23 MED ORDER — SODIUM CHLORIDE 0.9 % IV BOLUS (SEPSIS): 500.0000 mL | Freq: Once | INTRAVENOUS | Status: DC

## 2013-12-23 MED ORDER — AMLODIPINE BESYLATE 5 MG PO TABS: 5.0000 mg | ORAL_TABLET | Freq: Every day | ORAL | Status: DC

## 2013-12-23 MED ORDER — LISINOPRIL 20 MG PO TABS
20.0000 mg | ORAL_TABLET | Freq: Once | ORAL | Status: AC
  Administered 2013-12-23: 20 mg via ORAL
  Filled 2013-12-23: qty 1

## 2013-12-23 MED ORDER — AMLODIPINE BESYLATE 5 MG PO TABS
5.0000 mg | ORAL_TABLET | Freq: Once | ORAL | Status: AC
  Administered 2013-12-23: 5 mg via ORAL
  Filled 2013-12-23: qty 1

## 2013-12-23 MED ORDER — METFORMIN HCL 1000 MG PO TABS: 1000.0000 mg | ORAL_TABLET | Freq: Two times a day (BID) | ORAL | Status: DC

## 2013-12-23 MED ORDER — LISINOPRIL 20 MG PO TABS: 20.0000 mg | ORAL_TABLET | Freq: Every day | ORAL | Status: DC

## 2013-12-23 NOTE — ED Notes (Addendum)
Pt c/o numbness in right hand for several weeks and noticed knot on palm of right hand about week ago. Pt states that the knot just "feels numb". Pt has PMH HTN but hasnt taken his meds in several months.

## 2013-12-23 NOTE — ED Provider Notes (Signed)
CSN: [id number]     Arrival date & time 12/23/13  [id number] History   First MD Initiated Contact with Patient 12/23/13 [phone]     Chief Complaint  Patient presents with  . Numbness    right hand  . knot on palm   . headache      (Consider location/radiation/quality/duration/timing/severity/associated sxs/prior Treatment) The history is provided by the patient.  Claudie LeachSteve L Hinzman is a 48 y.o. male hx of HTN, DM, here with R hand numbness, chest pain. R hand numbness for several weeks. Woke up this morning and felt more numb in R hand. Works with his hands and noticed a knot in R hand. Denies trauma or injury. Has intermittent chest pain for several days. Substernal, nonradiating and not exertional. Still smokes. Denies abdominal pain or headaches. Hasn't been taking his meds for diabetes or hypertension for several months.    Past Medical History  Diagnosis Date  . Hypertension   . Diabetes mellitus without complication     but doesn't take his meds and hasn't in months  . Sleep apnea   . High cholesterol    Past Surgical History  Procedure Laterality Date  . No past surgeries    . Orif ankle fracture Left 08/07/2012    Procedure: OPEN REDUCTION INTERNAL FIXATION (ORIF) LEFT ANKLE FRACTURE;  Surgeon: Kathryne Hitchhristopher Y Blackman, MD;  Location: [facility] OR;  Service: Orthopedics;  Laterality: Left;   No family history on file. History  Substance Use Topics  . Smoking status: Current Every Day Smoker -- 1.00 packs/day    Types: Cigarettes  . Smokeless tobacco: Not on file  . Alcohol Use: Yes     Comment: occ    Review of Systems  Neurological: Positive for numbness.  All other systems reviewed and are negative.     Allergies  Review of patient's allergies indicates no known allergies.  Home Medications   Prior to Admission medications   Not on File   BP 169/97  Pulse 83  Temp(Src) 98.1 F (36.7 C) (Oral)  Resp 18  SpO2 98% Physical Exam  Nursing note and vitals  reviewed. Constitutional: He is oriented to person, place, and time. He appears well-developed and well-nourished.  HENT:  Head: Normocephalic.  Mouth/Throat: Oropharynx is clear and moist.  Eyes: Conjunctivae are normal. Pupils are equal, round, and reactive to light.  Neck: Normal range of motion. Neck supple.  Cardiovascular: Normal rate, regular rhythm and normal heart sounds.   Pulmonary/Chest: Effort normal and breath sounds normal. No respiratory distress. He has no wheezes. He has no rales. He exhibits no tenderness.  Abdominal: Soft. Bowel sounds are normal. He exhibits no distension. There is no tenderness. There is no rebound and no guarding.  Musculoskeletal: Normal range of motion. He exhibits no edema and no tenderness.  Neurological: He is alert and oriented to person, place, and time.  Dec sensation R palm. Callous on palm of hand with no obvious deformity. 2+ pulses, nl hand grasp. NL strength throughout all extremities. Nl gait.   Skin: Skin is warm and dry.  Psychiatric: He has a normal mood and affect. His behavior is normal. Judgment and thought content normal.    ED Course  Procedures (including critical care time) Labs Review Labs Reviewed  COMPREHENSIVE METABOLIC PANEL - Abnormal; Notable for the following:    Glucose, Bld 209 (*)    GFR calc non Af Amer 87 (*)    All other components within normal limits  CBG MONITORING, ED -  Abnormal; Notable for the following:    Glucose-Capillary 197 (*)    All other components within normal limits  CBC WITH DIFFERENTIAL  Rosezena Sensor, ED    Imaging Review Dg Chest 2 View  12/23/2013   CLINICAL DATA:  Smoker with history of cough and chest discomfort.  EXAM: CHEST  2 VIEW  COMPARISON:  08/07/2012  FINDINGS: The heart size and mediastinal contours are within normal limits. Both lungs are clear. The visualized skeletal structures are unremarkable.  IMPRESSION: No active cardiopulmonary disease.   Electronically Signed    By: Richarda Overlie M.D.   On: 12/23/2013 08:54     EKG Interpretation   Date/Time:  Monday December 23 2013 08:50:05 EDT Ventricular Rate:  92 PR Interval:  148 QRS Duration: 79 QT Interval:  348 QTC Calculation: 430 R Axis:   43 Text Interpretation:  Sinus rhythm Probable anteroseptal infarct, old  Borderline repolarization abnormality Baseline wander in lead(s) V2 No  significant change since last tracing Confirmed by YAO  MD, DAVID ([id number])  on 12/23/2013 8:58:46 AM      MDM   Final diagnoses:  None   KIEL COCKERELL is a 48 y.o. male here with R hand paresthesias, uncontrolled hypertension. Will get labs, CXR, EKG. I doubt ACS or dissection.   10:46 AM Labs at baseline. Glucose 209. CXR nl. BP improved with PO meds. Will d/c home on lisinopril, norvasc, metformin. Recommend f/u at [facility].      Richardean Canal, MD 12/23/13 1047

## 2013-12-23 NOTE — Discharge Instructions (Signed)
Take your BP meds and metformin as prescribed.   You can take tylenol, motrin for pain.   Follow [facility] with [facility] to recheck blood sugar and blood pressure.   Return to ER if you have severe pain, worse headaches, chest pain, more numbness or weakness.

## 2016-08-11 ENCOUNTER — Encounter (HOSPITAL_BASED_OUTPATIENT_CLINIC_OR_DEPARTMENT_OTHER): Payer: Self-pay | Admitting: Emergency Medicine

## 2016-08-11 ENCOUNTER — Emergency Department (HOSPITAL_BASED_OUTPATIENT_CLINIC_OR_DEPARTMENT_OTHER)
Admission: EM | Admit: 2016-08-11 | Discharge: 2016-08-11 | Disposition: A | Discharge status: Home / Self Care | Payer: No Typology Code available for payment source | Attending: Emergency Medicine | Admitting: Emergency Medicine

## 2016-08-11 ENCOUNTER — Emergency Department (HOSPITAL_BASED_OUTPATIENT_CLINIC_OR_DEPARTMENT_OTHER): Payer: No Typology Code available for payment source

## 2016-08-11 DIAGNOSIS — Z9114 Patient's other noncompliance with medication regimen: Secondary | ICD-10-CM | POA: Diagnosis not present

## 2016-08-11 DIAGNOSIS — I10 Essential (primary) hypertension: Secondary | ICD-10-CM | POA: Insufficient documentation

## 2016-08-11 DIAGNOSIS — Z7984 Long term (current) use of oral hypoglycemic drugs: Secondary | ICD-10-CM | POA: Diagnosis not present

## 2016-08-11 DIAGNOSIS — M25511 Pain in right shoulder: Secondary | ICD-10-CM | POA: Insufficient documentation

## 2016-08-11 DIAGNOSIS — Z79899 Other long term (current) drug therapy: Secondary | ICD-10-CM | POA: Insufficient documentation

## 2016-08-11 DIAGNOSIS — M25512 Pain in left shoulder: Secondary | ICD-10-CM | POA: Diagnosis not present

## 2016-08-11 DIAGNOSIS — R079 Chest pain, unspecified: Secondary | ICD-10-CM

## 2016-08-11 DIAGNOSIS — E119 Type 2 diabetes mellitus without complications: Secondary | ICD-10-CM | POA: Insufficient documentation

## 2016-08-11 DIAGNOSIS — R0789 Other chest pain: Secondary | ICD-10-CM | POA: Insufficient documentation

## 2016-08-11 DIAGNOSIS — Z91148 Patient's other noncompliance with medication regimen for other reason: Secondary | ICD-10-CM

## 2016-08-11 DIAGNOSIS — F17210 Nicotine dependence, cigarettes, uncomplicated: Secondary | ICD-10-CM | POA: Insufficient documentation

## 2016-08-11 LAB — BASIC METABOLIC PANEL
Anion gap: 7 (ref 5–15)
BUN: 14 mg/dL (ref 6–20)
CO2: 26 mmol/L (ref 22–32)
Calcium: 9.2 mg/dL (ref 8.9–10.3)
Chloride: 104 mmol/L (ref 101–111)
Creatinine, Ser: 1.1 mg/dL (ref 0.61–1.24)
GFR calc Af Amer: >60 mL/min (ref >60)
GFR calc non Af Amer: >60 mL/min (ref >60)
Glucose, Bld: 235 mg/dL — ABNORMAL HIGH (ref 65–99)
Potassium: 3.6 mmol/L (ref 3.5–5.1)
Sodium: 137 mmol/L (ref 135–145)

## 2016-08-11 LAB — CBC
HCT: 37.1 % — ABNORMAL LOW (ref 39.0–52.0)
Hemoglobin: 12.5 g/dL — ABNORMAL LOW (ref 13.0–17.0)
MCH: 29 pg (ref 26.0–34.0)
MCHC: 33.7 g/dL (ref 30.0–36.0)
MCV: 86.1 fL (ref 78.0–100.0)
Platelets: 164 10*3/uL (ref 150–400)
RBC: 4.31 MIL/uL (ref 4.22–5.81)
RDW: 13.9 % (ref 11.5–15.5)
WBC: 7.5 10*3/uL (ref 4.0–10.5)

## 2016-08-11 LAB — TROPONIN I: Troponin I: <0.03 ng/mL (ref <0.03)

## 2016-08-11 MED ORDER — NAPROXEN 500 MG PO TABS: 500.0000 mg | ORAL_TABLET | Freq: Two times a day (BID) | ORAL | 0 refills | Status: AC

## 2016-08-11 MED ORDER — METFORMIN HCL 1000 MG PO TABS: 1000.0000 mg | ORAL_TABLET | Freq: Two times a day (BID) | ORAL | 0 refills | Status: DC

## 2016-08-11 MED ORDER — ASPIRIN 81 MG PO CHEW
324.0000 mg | CHEWABLE_TABLET | Freq: Once | ORAL | Status: AC
  Administered 2016-08-11: 324 mg via ORAL
  Filled 2016-08-11: qty 4

## 2016-08-11 MED ORDER — LISINOPRIL 20 MG PO TABS: 20.0000 mg | ORAL_TABLET | Freq: Every day | ORAL | 0 refills | Status: DC

## 2016-08-11 MED ORDER — AMLODIPINE BESYLATE 5 MG PO TABS: 5.0000 mg | ORAL_TABLET | Freq: Every day | ORAL | 0 refills | Status: DC

## 2016-08-11 NOTE — ED Triage Notes (Signed)
Patient states that about 2 weeks ago he started to have pain across his chest from shoulder to shoulder. Reports that it is increased with standing and moving. The patient denies any SOB or diaphoresis. Denies any pain with inspiration

## 2016-08-11 NOTE — ED Notes (Signed)
Patient transported to X-ray 

## 2016-08-11 NOTE — ED Provider Notes (Signed)
[facility] Note   CSN: [id number] Arrival date & time: 08/11/16  1939  By signing my name below, I, [name], attest that this documentation has been prepared under the direction and in the presence of [name], MD. Electronically signed: [name], ED Scribe. 08/11/16. 8:10 PM.   History   Chief Complaint Chief Complaint  Patient presents with  . Chest Pain    HPI Clifford Frederick is a 51 y.o. male with hx of HTN who presents to the Emergency Department complaining of mild chest pain with onset x1 week. Pt states that he has had pain radiating from the R shoulder to the L shoulder for the past x1 week. Pt states that lying down relieves the pain but when he stands, he feels the chest pain in his upper chest. He denies pain with palpation of the neck. Pt states that x2 weeks ago he had a new job and since he has had the pain which was made worse by lifting. He describes the pain as aching. Of note, pt is a smoker. Pt states that he has not been compliant with his diabetes or hypertension medication   The history is provided by the patient. No language interpreter was used.    Past Medical History:  Diagnosis Date  . Diabetes mellitus without complication (HCC)    but doesn't take his meds and hasn't in months  . High cholesterol   . Hypertension   . Sleep apnea     Patient Active Problem List   Diagnosis Date Noted  . Essential hypertension, benign 08/08/2012  . Diabetes mellitus (HCC) 08/08/2012  . Fracture of lateral malleolus of left ankle 08/07/2012    Past Surgical History:  Procedure Laterality Date  . NO PAST SURGERIES    . ORIF ANKLE FRACTURE Left 08/07/2012   Procedure: OPEN REDUCTION INTERNAL FIXATION (ORIF) LEFT ANKLE FRACTURE;  Surgeon: Kathryne Hitch, MD;  Location: [facility] OR;  Service: Orthopedics;  Laterality: Left;       Home Medications    Prior to Admission medications   Medication Sig Start  Date End Date Taking? Authorizing Provider  amLODipine (NORVASC) 5 MG tablet Take 1 tablet (5 mg total) by mouth daily. 12/23/13   Charlynne Pander, MD  lisinopril (PRINIVIL,ZESTRIL) 20 MG tablet Take 1 tablet (20 mg total) by mouth daily. 12/23/13   Charlynne Pander, MD  metFORMIN (GLUCOPHAGE) 1000 MG tablet Take 1 tablet (1,000 mg total) by mouth 2 (two) times daily. 12/23/13   Charlynne Pander, MD    Family History History reviewed. No pertinent family history.  Social History Social History  Substance Use Topics  . Smoking status: Current Every Day Smoker    Packs/day: 1.00    Types: Cigarettes  . Smokeless tobacco: Not on file  . Alcohol use Yes     Comment: occ     Allergies   Patient has no known allergies.   Review of Systems Review of Systems  Constitutional: Negative for chills and fever.  Cardiovascular: Positive for chest pain.  Gastrointestinal: Negative for vomiting.  All other systems reviewed and are negative.    Physical Exam Updated Vital Signs BP (!) 175/105 (BP Location: Left Arm)   Pulse 109   Temp 98.6 F (37 C) (Oral)   Resp 20   Ht 5\' 10"  (1.778 m)   Wt 250 lb (113.4 kg)   SpO2 100%   BMI 35.87 kg/m   Physical  Exam  Constitutional: He is oriented to person, place, and time. He appears well-developed and well-nourished. No distress.  HENT:  Head: Normocephalic and atraumatic.  Nose: Nose normal.  Eyes: Conjunctivae are normal.  Neck: Neck supple. No tracheal deviation present.  Cardiovascular: Normal rate, regular rhythm and normal heart sounds.   Pulmonary/Chest: Effort normal and breath sounds normal. No respiratory distress.  Abdominal: Soft. He exhibits no distension.  Neurological: He is alert and oriented to person, place, and time.  Skin: Skin is warm and dry.  Psychiatric: He has a normal mood and affect.  Vitals reviewed.    ED Treatments / Results   DIAGNOSTIC STUDIES: Oxygen Saturation is 100% on RA, normal by my  interpretation.   COORDINATION OF CARE: 8:10 PM-Discussed next steps with pt. Pt verbalized understanding and is agreeable with the plan.    Labs (all labs ordered are listed, but only abnormal results are displayed) Labs Reviewed  BASIC METABOLIC PANEL - Abnormal; Notable for the following:       Result Value   Glucose, Bld 235 (*)    All other components within normal limits  CBC - Abnormal; Notable for the following:    Hemoglobin 12.5 (*)    HCT 37.1 (*)    All other components within normal limits  TROPONIN I    EKG  EKG Interpretation  Date/Time:  Thursday August 11 2016 19:50:41 EST Ventricular Rate:  99 PR Interval:  144 QRS Duration: 82 QT Interval:  338 QTC Calculation: 433 R Axis:   26 Text Interpretation:  Normal sinus rhythm ST & T wave abnormality, consider inferolateral ischemia Abnormal ECG No significant change since last tracing Confirmed by Elysha Daw MD, Breane Grunwald [phone]([id number]) on 08/11/2016 7:57:39 PM       Radiology Dg Chest 2 View  Result Date: 08/11/2016 CLINICAL DATA:  51 year old presenting with a 2 week history of chest tightness. Current history of hypertension and diabetes. Current smoker. EXAM: CHEST  2 VIEW COMPARISON:  12/23/2013, 08/07/2012. FINDINGS: Cardiac silhouette upper normal in size, unchanged. Hilar and mediastinal contours otherwise unremarkable. Lungs clear. Bronchovascular markings normal. Pulmonary vascularity normal. No visible pleural effusions. No pneumothorax. Degenerative changes involving the mid thoracic spine. IMPRESSION: No acute cardiopulmonary disease. Electronically Signed   By: Hulan Saashomas  Lawrence M.D.   On: 08/11/2016 20:32    Procedures Procedures (including critical care time)  Medications Ordered in ED Medications  aspirin chewable tablet 324 mg (324 mg Oral Given 08/11/16 2018)     Initial Impression / Assessment and Plan / ED Course  I have reviewed the triage vital signs and the nursing notes.  Pertinent labs & imaging  results that were available during my care of the patient were reviewed by me and considered in my medical decision making (see chart for details).     51 y.o. male presents with bilateral shoulder pain and upper chest pain described as soreness that started since he has been doing lifting at work. He is well appearing. He has repolarization abnormalities that are c/w his poorly controlled hypertension d/t noncompliance. Pain is highly atypical and troponin negative despite ongoing discomfort. He was supplied refills of his HTN and diabetes medications and I stressed compliance and explained that the cost of these medications is low and could benefit him in the long run to prevent major medical consequences. NSAIDs for pain. Patient needs to establish primary care in the area and was provided contact information to do so. Return precautions discussed for worsening or new concerning  symptoms.   Final Clinical Impressions(s) / ED Diagnoses   Final diagnoses:  Acute pain of both shoulders  Essential hypertension  Noncompliance with medications  Nonspecific chest pain    New Prescriptions Discharge Medication List as of 08/11/2016  8:56 PM    START taking these medications   Details  naproxen (NAPROSYN) 500 MG tablet Take 1 tablet (500 mg total) by mouth 2 (two) times daily with a meal., Starting Thu 08/11/2016, Until Mon 08/15/2016, Print       I personally performed the services described in this documentation, which was scribed in my presence. The recorded information has been reviewed and is accurate.      [name], MD 08/12/16 [phone]

## 2017-08-15 ENCOUNTER — Encounter (HOSPITAL_BASED_OUTPATIENT_CLINIC_OR_DEPARTMENT_OTHER): Payer: Self-pay | Admitting: Emergency Medicine

## 2017-08-15 ENCOUNTER — Emergency Department (HOSPITAL_BASED_OUTPATIENT_CLINIC_OR_DEPARTMENT_OTHER): Payer: 59

## 2017-08-15 ENCOUNTER — Other Ambulatory Visit: Payer: Self-pay

## 2017-08-15 ENCOUNTER — Inpatient Hospital Stay (HOSPITAL_BASED_OUTPATIENT_CLINIC_OR_DEPARTMENT_OTHER)
Admission: EM | Admit: 2017-08-15 | Discharge: 2017-08-17 | DRG: 247 | Disposition: A | Discharge status: Home / Self Care | Payer: 59 | Attending: Interventional Cardiology | Admitting: Interventional Cardiology

## 2017-08-15 DIAGNOSIS — G4730 Sleep apnea, unspecified: Secondary | ICD-10-CM | POA: Diagnosis present

## 2017-08-15 DIAGNOSIS — Z8249 Family history of ischemic heart disease and other diseases of the circulatory system: Secondary | ICD-10-CM | POA: Diagnosis not present

## 2017-08-15 DIAGNOSIS — E1165 Type 2 diabetes mellitus with hyperglycemia: Secondary | ICD-10-CM | POA: Diagnosis not present

## 2017-08-15 DIAGNOSIS — I249 Acute ischemic heart disease, unspecified: Secondary | ICD-10-CM | POA: Insufficient documentation

## 2017-08-15 DIAGNOSIS — E1169 Type 2 diabetes mellitus with other specified complication: Secondary | ICD-10-CM | POA: Diagnosis not present

## 2017-08-15 DIAGNOSIS — F17210 Nicotine dependence, cigarettes, uncomplicated: Secondary | ICD-10-CM | POA: Diagnosis present

## 2017-08-15 DIAGNOSIS — R079 Chest pain, unspecified: Secondary | ICD-10-CM

## 2017-08-15 DIAGNOSIS — Z720 Tobacco use: Secondary | ICD-10-CM | POA: Diagnosis present

## 2017-08-15 DIAGNOSIS — E669 Obesity, unspecified: Secondary | ICD-10-CM

## 2017-08-15 DIAGNOSIS — E785 Hyperlipidemia, unspecified: Secondary | ICD-10-CM | POA: Diagnosis not present

## 2017-08-15 DIAGNOSIS — I25110 Atherosclerotic heart disease of native coronary artery with unstable angina pectoris: Secondary | ICD-10-CM | POA: Diagnosis not present

## 2017-08-15 DIAGNOSIS — I200 Unstable angina: Secondary | ICD-10-CM

## 2017-08-15 DIAGNOSIS — R9431 Abnormal electrocardiogram [ECG] [EKG]: Secondary | ICD-10-CM

## 2017-08-15 DIAGNOSIS — Z833 Family history of diabetes mellitus: Secondary | ICD-10-CM | POA: Diagnosis not present

## 2017-08-15 DIAGNOSIS — Z6835 Body mass index (BMI) 35.0-35.9, adult: Secondary | ICD-10-CM

## 2017-08-15 DIAGNOSIS — E7800 Pure hypercholesterolemia, unspecified: Secondary | ICD-10-CM | POA: Diagnosis not present

## 2017-08-15 DIAGNOSIS — I160 Hypertensive urgency: Secondary | ICD-10-CM | POA: Diagnosis present

## 2017-08-15 DIAGNOSIS — I420 Dilated cardiomyopathy: Secondary | ICD-10-CM | POA: Diagnosis present

## 2017-08-15 DIAGNOSIS — Z9119 Patient's noncompliance with other medical treatment and regimen: Secondary | ICD-10-CM | POA: Diagnosis not present

## 2017-08-15 DIAGNOSIS — Z955 Presence of coronary angioplasty implant and graft: Secondary | ICD-10-CM

## 2017-08-15 DIAGNOSIS — I10 Essential (primary) hypertension: Secondary | ICD-10-CM | POA: Diagnosis not present

## 2017-08-15 DIAGNOSIS — D649 Anemia, unspecified: Secondary | ICD-10-CM | POA: Diagnosis present

## 2017-08-15 HISTORY — DX: Type 2 diabetes mellitus with other specified complication: E66.9

## 2017-08-15 HISTORY — DX: Tobacco use: Z72.0

## 2017-08-15 HISTORY — DX: Type 2 diabetes mellitus with other specified complication: E11.69

## 2017-08-15 HISTORY — DX: Acute ischemic heart disease, unspecified: I24.9

## 2017-08-15 HISTORY — DX: Chest pain, unspecified: R07.9

## 2017-08-15 HISTORY — DX: Hypertensive urgency: I16.0

## 2017-08-15 LAB — BASIC METABOLIC PANEL
Anion gap: 8 (ref 5–15)
BUN: 15 mg/dL (ref 6–20)
CO2: 25 mmol/L (ref 22–32)
Calcium: 8.9 mg/dL (ref 8.9–10.3)
Chloride: 103 mmol/L (ref 101–111)
Creatinine, Ser: 1.18 mg/dL (ref 0.61–1.24)
GFR calc Af Amer: >60 mL/min (ref >60)
GFR calc non Af Amer: >60 mL/min (ref >60)
Glucose, Bld: 213 mg/dL — ABNORMAL HIGH (ref 65–99)
Potassium: 3.9 mmol/L (ref 3.5–5.1)
Sodium: 136 mmol/L (ref 135–145)

## 2017-08-15 LAB — CBC WITH DIFFERENTIAL/PLATELET
Basophils Absolute: 0 10*3/uL (ref 0.0–0.1)
Basophils Relative: 0 %
Eosinophils Absolute: 0.1 10*3/uL (ref 0.0–0.7)
Eosinophils Relative: 2 %
HCT: 36.9 % — ABNORMAL LOW (ref 39.0–52.0)
Hemoglobin: 12.5 g/dL — ABNORMAL LOW (ref 13.0–17.0)
Lymphocytes Relative: 34 %
Lymphs Abs: 3 10*3/uL (ref 0.7–4.0)
MCH: 29.4 pg (ref 26.0–34.0)
MCHC: 33.9 g/dL (ref 30.0–36.0)
MCV: 86.8 fL (ref 78.0–100.0)
Monocytes Absolute: 0.7 10*3/uL (ref 0.1–1.0)
Monocytes Relative: 7 %
Neutro Abs: 5.1 10*3/uL (ref 1.7–7.7)
Neutrophils Relative %: 57 %
Platelets: 172 10*3/uL (ref 150–400)
RBC: 4.25 MIL/uL (ref 4.22–5.81)
RDW: 13.8 % (ref 11.5–15.5)
WBC: 8.9 10*3/uL (ref 4.0–10.5)

## 2017-08-15 LAB — TROPONIN I
Troponin I: 0.03 ng/mL (ref <0.03)
Troponin I: <0.03 ng/mL (ref <0.03)

## 2017-08-15 LAB — GLUCOSE, CAPILLARY: Glucose-Capillary: 188 mg/dL — ABNORMAL HIGH (ref 65–99)

## 2017-08-15 MED ORDER — AMLODIPINE BESYLATE 5 MG PO TABS: 5.0000 mg | ORAL_TABLET | Freq: Every day | ORAL | Status: DC

## 2017-08-15 MED ORDER — MORPHINE SULFATE (PF) 2 MG/ML IV SOLN: 2.0000 mg | INTRAVENOUS | Status: DC | PRN

## 2017-08-15 MED ORDER — GI COCKTAIL ~~LOC~~: 30.0000 mL | Freq: Four times a day (QID) | ORAL | Status: DC | PRN

## 2017-08-15 MED ORDER — ONDANSETRON HCL 4 MG/2ML IJ SOLN: 4.0000 mg | Freq: Four times a day (QID) | INTRAMUSCULAR | Status: DC | PRN

## 2017-08-15 MED ORDER — ACETAMINOPHEN 325 MG PO TABS
650.0000 mg | ORAL_TABLET | ORAL | Status: DC | PRN
  Administered 2017-08-16 (×2): 650 mg via ORAL
  Filled 2017-08-15 (×2): qty 2

## 2017-08-15 MED ORDER — ENOXAPARIN SODIUM 40 MG/0.4ML ~~LOC~~ SOLN
40.0000 mg | SUBCUTANEOUS | Status: DC
  Administered 2017-08-15 – 2017-08-16 (×2): 40 mg via SUBCUTANEOUS
  Filled 2017-08-15 (×2): qty 0.4

## 2017-08-15 MED ORDER — INSULIN ASPART 100 UNIT/ML ~~LOC~~ SOLN
0.0000 [IU] | Freq: Every day | SUBCUTANEOUS | Status: DC
  Administered 2017-08-16: 2 [IU] via SUBCUTANEOUS

## 2017-08-15 MED ORDER — HYDRALAZINE HCL 20 MG/ML IJ SOLN
10.0000 mg | INTRAMUSCULAR | Status: DC | PRN
  Administered 2017-08-15 – 2017-08-17 (×5): 10 mg via INTRAVENOUS
  Filled 2017-08-15 (×5): qty 1

## 2017-08-15 MED ORDER — ASPIRIN 81 MG PO CHEW
324.0000 mg | CHEWABLE_TABLET | Freq: Once | ORAL | Status: AC
  Administered 2017-08-15: 324 mg via ORAL
  Filled 2017-08-15: qty 4

## 2017-08-15 MED ORDER — NITROGLYCERIN 0.4 MG SL SUBL: 0.4000 mg | SUBLINGUAL_TABLET | SUBLINGUAL | Status: DC | PRN

## 2017-08-15 MED ORDER — SODIUM CHLORIDE 0.9 % IV SOLN
INTRAVENOUS | Status: DC
  Administered 2017-08-15: 23:00:00 via INTRAVENOUS

## 2017-08-15 MED ORDER — LISINOPRIL 10 MG PO TABS
20.0000 mg | ORAL_TABLET | Freq: Every day | ORAL | Status: DC
  Administered 2017-08-16: 20 mg via ORAL
  Filled 2017-08-15: qty 1

## 2017-08-15 MED ORDER — NICOTINE 21 MG/24HR TD PT24
MEDICATED_PATCH | TRANSDERMAL | Status: AC
  Administered 2017-08-15: 21 mg
  Filled 2017-08-15: qty 1

## 2017-08-15 MED ORDER — INSULIN ASPART 100 UNIT/ML ~~LOC~~ SOLN
0.0000 [IU] | Freq: Three times a day (TID) | SUBCUTANEOUS | Status: DC
  Administered 2017-08-16: 2 [IU] via SUBCUTANEOUS
  Administered 2017-08-16: 1 [IU] via SUBCUTANEOUS
  Administered 2017-08-16 – 2017-08-17 (×2): 2 [IU] via SUBCUTANEOUS
  Administered 2017-08-17: 3 [IU] via SUBCUTANEOUS

## 2017-08-15 NOTE — H&P (Addendum)
History and Physical    Clifford Frederick ZOX:[id number] DOB: 1966/03/21 DOA: 08/15/2017  Referring MD/NP/PA: Dr. Koren Bound PCP: Patient, No Pcp Per  Patient coming from: [facility] transfer  Chief Complaint: Chest tightness  I have personally briefly reviewed patient's old medical records in [facility]   HPI: Clifford Frederick is a 52 y.o. male with medical history significant of HTN, HLD,  DM type II, and tobacco abuse; who presents with complaints of chest tightness.  Patient reports being at work and had been smoking a cigarette at the time of onset.  Reports having a tightness feeling across his chest that did not radiate, and caused him to feel short of breath.  Tried going outside to get some air as well as drinking water without relief of symptoms.  Symptoms lasted approximately 20 minutes and then self resolved.  Denies any nausea, vomiting, diaphoresis, fever, chills, wheezing, abdominal pain, leg swelling, calf pain, recent travel/prolonged immobilization, or recent sick contacts to his knowledge.  He reports having associated symptoms of a chronic mild cough that is relatively unchanged.  He currently admits to smoking, but reports only smok approximately half a pack when he works.  Patient has previous history of hypertension and diabetes but is not currently on any of the medications to treat for these as he is not had a primary care provider in many years.  Denies any previous symptoms like this before in the past.  ED Course: Upon admission into the emergency department patient was noted to be afebrile, pulse 93-102, respirations 17-19, blood pressure 168/99-188/133, and O2 saturations maintained on room air.  Labs revealed WBC 8.9, hemoglobin 12.5, glucose 213, and troponin 0.03.  Chest x-ray showed borderline cardiomegaly.  Patient was given a 325 mg of aspirin.  Patient was accepted to a telemetry bed at [facility].  Review of Systems  Constitutional: Negative for chills, diaphoresis,  fever, malaise/fatigue and weight loss.  HENT: Negative for ear pain and tinnitus.   Eyes: Negative for photophobia and pain.  Respiratory: Positive for cough (Chronic) and shortness of breath. Negative for wheezing.   Cardiovascular: Positive for chest pain. Negative for leg swelling.  Gastrointestinal: Negative for abdominal pain, nausea and vomiting.  Genitourinary: Negative for dysuria and hematuria.  Musculoskeletal: Negative for joint pain and neck pain.  Skin: Negative for itching and rash.  Neurological: Negative for seizures, loss of consciousness and weakness.  Endo/Heme/Allergies: Negative for environmental allergies. Does not bruise/bleed easily.  Psychiatric/Behavioral: Negative for memory loss and suicidal ideas.    Past Medical History:  Diagnosis Date  . Diabetes mellitus without complication (HCC)    but doesn't take his meds and hasn't in months  . High cholesterol   . Hypertension   . Sleep apnea     Past Surgical History:  Procedure Laterality Date  . NO PAST SURGERIES    . ORIF ANKLE FRACTURE Left 08/07/2012   Procedure: OPEN REDUCTION INTERNAL FIXATION (ORIF) LEFT ANKLE FRACTURE;  Surgeon: Kathryne Hitch, MD;  Location: [facility] OR;  Service: Orthopedics;  Laterality: Left;     reports that he has been smoking cigarettes.  He has been smoking about 1.00 pack per day. he has never used smokeless tobacco. He reports that he drinks alcohol. He reports that he does not use drugs.  No Known Allergies  Family History  Problem Relation Age of Onset  . Diabetes Mother   . Diabetes Brother     Prior to Admission medications   Medication  Sig Start Date End Date Taking? Authorizing Provider  amLODipine (NORVASC) 5 MG tablet Take 1 tablet (5 mg total) by mouth daily. 08/11/16   Lyndal PulleyKnott, Daniel, MD  lisinopril (PRINIVIL,ZESTRIL) 20 MG tablet Take 1 tablet (20 mg total) by mouth daily. 08/11/16   Lyndal PulleyKnott, Daniel, MD  metFORMIN (GLUCOPHAGE) 1000 MG tablet Take 1 tablet  (1,000 mg total) by mouth 2 (two) times daily. 08/11/16   Lyndal PulleyKnott, Daniel, MD    Physical Exam:  Constitutional: Older male in NAD, calm, comfortable Vitals:   08/15/17 1953 08/15/17 2007 08/15/17 2008 08/15/17 2100  BP: (!) 170/108     Pulse: (!) 101  99   Resp: 17     Temp:      TempSrc:      SpO2: 99% 100%    Weight:    113.7 kg (250 lb 11.2 oz)  Height:    5\' 10"  (1.778 m)   Eyes: PERRL, lids and conjunctivae normal ENMT: Mucous membranes are moist. Posterior pharynx clear of any exudate or lesions.Normal dentition.  Neck: normal, supple, no masses, no thyromegaly Respiratory: clear to auscultation bilaterally, no wheezing, no crackles. Normal respiratory effort. No accessory muscle use.  Cardiovascular: Regular rate and rhythm, no murmurs / rubs / gallops. No extremity edema. 2+ pedal pulses. No carotid bruits.  Abdomen: no tenderness, no masses palpated. No hepatosplenomegaly. Bowel sounds positive.  Musculoskeletal: no clubbing / cyanosis. No joint deformity upper and lower extremities. Good ROM, no contractures. Normal muscle tone.  Skin: no rashes, lesions, ulcers. No induration Neurologic: CN 2-12 grossly intact. Sensation intact, DTR normal. Strength 5/5 in all 4.  Psychiatric: Normal judgment and insight. Alert and oriented x 3. Normal mood.     Labs on Admission: I have personally reviewed following labs and imaging studies  CBC: Recent Labs  Lab 08/15/17 1722  WBC 8.9  NEUTROABS 5.1  HGB 12.5*  HCT 36.9*  MCV 86.8  PLT 172   Basic Metabolic Panel: Recent Labs  Lab 08/15/17 1722  NA 136  K 3.9  CL 103  CO2 25  GLUCOSE 213*  BUN 15  CREATININE 1.18  CALCIUM 8.9   GFR: Estimated Creatinine Clearance: 93.5 mL/min (by C-G formula based on SCr of 1.18 mg/dL). Liver Function Tests: No results for input(s): AST, ALT, ALKPHOS, BILITOT, PROT, ALBUMIN in the last 168 hours. No results for input(s): LIPASE, AMYLASE in the last 168 hours. No results for  input(s): AMMONIA in the last 168 hours. Coagulation Profile: No results for input(s): INR, PROTIME in the last 168 hours. Cardiac Enzymes: Recent Labs  Lab 08/15/17 1722  TROPONINI 0.03*   BNP (last 3 results) No results for input(s): PROBNP in the last 8760 hours. HbA1C: No results for input(s): HGBA1C in the last 72 hours. CBG: No results for input(s): GLUCAP in the last 168 hours. Lipid Profile: No results for input(s): CHOL, HDL, LDLCALC, TRIG, CHOLHDL, LDLDIRECT in the last 72 hours. Thyroid Function Tests: No results for input(s): TSH, T4TOTAL, FREET4, T3FREE, THYROIDAB in the last 72 hours. Anemia Panel: No results for input(s): VITAMINB12, FOLATE, FERRITIN, TIBC, IRON, RETICCTPCT in the last 72 hours. Urine analysis:    Component Value Date/Time   COLORURINE YELLOW 04/06/2007 0709   APPEARANCEUR CLEAR 04/06/2007 0709   LABSPEC 1.018 04/06/2007 0709   PHURINE 7.0 04/06/2007 0709   GLUCOSEU NEGATIVE 04/06/2007 0709   HGBUR NEGATIVE 04/06/2007 0709   BILIRUBINUR NEGATIVE 04/06/2007 0709   KETONESUR NEGATIVE 04/06/2007 0709   PROTEINUR NEGATIVE 04/06/2007 16100709  UROBILINOGEN 1.0 04/06/2007 0709   NITRITE NEGATIVE 04/06/2007 0709   LEUKOCYTESUR  04/06/2007 0709    NEGATIVE MICROSCOPIC NOT DONE ON URINES WITH NEGATIVE PROTEIN, BLOOD, LEUKOCYTES, NITRITE, OR GLUCOSE <1000 mg/dL.   Sepsis Labs: No results found for this or any previous visit (from the past 240 hour(s)).   Radiological Exams on Admission: Dg Chest 2 View  Result Date: 08/15/2017 CLINICAL DATA:  Shortness of breath.  Chest tightness. EXAM: CHEST - 2 VIEW COMPARISON:  08/11/2016. FINDINGS: Mediastinum and hilar structures normal. Lungs are clear. No pleural effusion or pneumothorax. Borderline cardiomegaly with normal pulmonary vascularity. IMPRESSION: 1.  Borderline cardiomegaly.  No pulmonary venous congestion. 2.  No acute pulmonary disease. Electronically Signed   By: Maisie Fus  Register   On: 08/15/2017  14:21    EKG: Independently reviewed.  Sinus tachycardia at 102 bpm with T wave inversions in the lateral leads with downsloping ST segments, Q waves in V1 and biatrial enlargement.  Similar to previous tracing.    Assessment/Plan Chest pain with elevated troponin: Acute.  Patient presents with complaints of chest pain found to have elevated troponin of 0.03.  EKG was noted to be abnormal warranting admission for chest pain rule out.  Patient received 325 mg of aspirin while in the ED. - Admit to a telemetry bed - Trend cardiac troponins - check d-dimer - Nitroglycerin prn chest pain - Check lipid panel in a.m. - Check echocardiogram in a.m. - Message sent for cardiology to eval for need of stress testing  Hypertensive urgency: Acute.  Blood pressures noted to be as high as 188/133 on admission. - Restart amlodipine and lisinopril - Hydralazine IV prn >sBP/dBP  Diabetes mellitus type 2 with hyperglycemia: Patient not on previously prescribed oral medications of metformin.  Initial blood glucose elevated at 216.  Patient's last hemoglobin A1c noted from 08/2012 was 7.7. - Hypoglycemic protocol - Hold metformin - Check hemoglobin A1c - CBGs q. before meals and at bedtime with sensitive SSI  Hyperlipidemia - Follow-up lipid panel  Normocytic anemia: Hemoglobin stable at 12.5 g/dL. - Continue to monitor  Obesity: BMI 35.9 - Continue to counsel patient on need of weight loss  Tobacco abuse - Counseled on the need of cessation of tobacco  Patient needs referral to primary care provider.  DVT prophylaxis: Lovenox   Code Status: Lovenox Family Communication: No family present at bedside Disposition Plan: Possible discharge home if workup negative Consults called: none  Admission status: observation  Clydie Braun MD [facility] Pager [phone]   If 7PM-7AM, please contact night-coverage [email or web address] Password [email or web address]  08/15/2017, 9:20 PM

## 2017-08-15 NOTE — ED Provider Notes (Signed)
[facility] PROGRESSIVE CARE Provider Note   CSN: [id number] Arrival date & time: 08/15/17  1359     History   Chief Complaint Chief Complaint  Patient presents with  . Shortness of Breath    HPI Clifford LeachSteve L Grunden is a 52 y.o. male.  Patient is a 52 year old male with a history of diabetes, hypertension and hyperlipidemia who presents after an episode of chest pain.  He is a smoker and while he was at work he was smoking a cigarette.  While he was smoking, he started to have onset of chest pain.  He describes as a tightness across his chest associated with some shortness of breath.  The pain was across his chest and did not radiate down his arms or to his back.  He states this lasted about 15-20 minutes.  He has had no further episodes since that time.  There is no nausea or vomiting.  No diaphoresis.  He has no prior history of heart problems.  He denies any leg pain or swelling.  No fevers or other recent illnesses.  He does have a chronic cough which he says is unchanged from his baseline.  He has frequent rhinorrhea which she attributes to his job.  He denies any family history of early heart disease.  He has not had a prior stress test.  He is currently chest pain-free.      Past Medical History:  Diagnosis Date  . Diabetes mellitus without complication (HCC)    but doesn't take his meds and hasn't in months  . High cholesterol   . Hypertension   . Sleep apnea     Patient Active Problem List   Diagnosis Date Noted  . Acute coronary syndrome (HCC) 08/15/2017  . Chest pain 08/15/2017  . Tobacco abuse 08/15/2017  . Hypertensive urgency 08/15/2017  . Diabetes mellitus type 2 in obese (HCC) 08/15/2017  . Normocytic anemia 08/15/2017  . Essential hypertension, benign 08/08/2012  . Diabetes mellitus (HCC) 08/08/2012  . Fracture of lateral malleolus of left ankle 08/07/2012    Past Surgical History:  Procedure Laterality Date  . NO PAST SURGERIES    . ORIF ANKLE  FRACTURE Left 08/07/2012   Procedure: OPEN REDUCTION INTERNAL FIXATION (ORIF) LEFT ANKLE FRACTURE;  Surgeon: Kathryne Hitchhristopher Y Blackman, MD;  Location: [facility] OR;  Service: Orthopedics;  Laterality: Left;       Home Medications    Prior to Admission medications   Medication Sig Start Date End Date Taking? Authorizing Provider  amLODipine (NORVASC) 5 MG tablet Take 1 tablet (5 mg total) by mouth daily. 08/11/16   Lyndal PulleyKnott, Daniel, MD  lisinopril (PRINIVIL,ZESTRIL) 20 MG tablet Take 1 tablet (20 mg total) by mouth daily. 08/11/16   Lyndal PulleyKnott, Daniel, MD  metFORMIN (GLUCOPHAGE) 1000 MG tablet Take 1 tablet (1,000 mg total) by mouth 2 (two) times daily. 08/11/16   Lyndal PulleyKnott, Daniel, MD    Family History Family History  Problem Relation Age of Onset  . Diabetes Mother   . Diabetes Brother     Social History Social History   Tobacco Use  . Smoking status: Current Every Day Smoker    Packs/day: 1.00    Types: Cigarettes  . Smokeless tobacco: Never Used  Substance Use Topics  . Alcohol use: Yes    Comment: occ  . Drug use: No     Allergies   Patient has no known allergies.   Review of Systems Review of Systems  Constitutional: Negative for chills, diaphoresis, fatigue  and fever.  HENT: Negative for congestion, rhinorrhea and sneezing.   Eyes: Negative.   Respiratory: Positive for shortness of breath. Negative for cough and chest tightness.   Cardiovascular: Positive for chest pain. Negative for leg swelling.  Gastrointestinal: Negative for abdominal pain, blood in stool, diarrhea, nausea and vomiting.  Genitourinary: Negative for difficulty urinating, flank pain, frequency and hematuria.  Musculoskeletal: Negative for arthralgias and back pain.  Skin: Negative for rash.  Neurological: Negative for dizziness, speech difficulty, weakness, numbness and headaches.     Physical Exam Updated Vital Signs BP (!) 188/133   Pulse 99   Temp 98.8 F (37.1 C) (Oral)   Resp 18   Ht 5\' 10"  (1.778 m)    Wt 113.7 kg (250 lb 11.2 oz)   SpO2 100%   BMI 35.97 kg/m   Physical Exam  Constitutional: He is oriented to person, place, and time. He appears well-developed and well-nourished.  HENT:  Head: Normocephalic and atraumatic.  Eyes: Pupils are equal, round, and reactive to light.  Neck: Normal range of motion. Neck supple.  Cardiovascular: Normal rate, regular rhythm and normal heart sounds.  Pulmonary/Chest: Effort normal and breath sounds normal. No respiratory distress. He has no wheezes. He has no rales. He exhibits no tenderness.  Abdominal: Soft. Bowel sounds are normal. There is no tenderness. There is no rebound and no guarding.  Musculoskeletal: Normal range of motion. He exhibits no edema.  Lymphadenopathy:    He has no cervical adenopathy.  Neurological: He is alert and oriented to person, place, and time.  Skin: Skin is warm and dry. No rash noted.  Psychiatric: He has a normal mood and affect.     ED Treatments / Results  Labs (all labs ordered are listed, but only abnormal results are displayed) Labs Reviewed  BASIC METABOLIC PANEL - Abnormal; Notable for the following components:      Result Value   Glucose, Bld 213 (*)    All other components within normal limits  CBC WITH DIFFERENTIAL/PLATELET - Abnormal; Notable for the following components:   Hemoglobin 12.5 (*)    HCT 36.9 (*)    All other components within normal limits  TROPONIN I - Abnormal; Notable for the following components:   Troponin I 0.03 (*)    All other components within normal limits  GLUCOSE, CAPILLARY - Abnormal; Notable for the following components:   Glucose-Capillary 188 (*)    All other components within normal limits  TROPONIN I  HIV ANTIBODY (ROUTINE TESTING)  TROPONIN I  TROPONIN I  LIPID PANEL  CBC WITH DIFFERENTIAL/PLATELET  BASIC METABOLIC PANEL  HEMOGLOBIN A1C  D-DIMER, QUANTITATIVE (NOT AT [facility])    EKG  EKG Interpretation  Date/Time:  Tuesday August 15 2017 14:12:36  EDT Ventricular Rate:  102 PR Interval:  134 QRS Duration: 86 QT Interval:  342 QTC Calculation: 445 R Axis:   33 Text Interpretation:  Sinus tachycardia Possible Anterior infarct , age undetermined ST & T wave abnormality, consider inferolateral ischemia Abnormal ECG since last tracing no significant change Confirmed by Rolan Bucco [id number]) on 08/15/2017 4:27:14 PM       Radiology Dg Chest 2 View  Result Date: 08/15/2017 CLINICAL DATA:  Shortness of breath.  Chest tightness. EXAM: CHEST - 2 VIEW COMPARISON:  08/11/2016. FINDINGS: Mediastinum and hilar structures normal. Lungs are clear. No pleural effusion or pneumothorax. Borderline cardiomegaly with normal pulmonary vascularity. IMPRESSION: 1.  Borderline cardiomegaly.  No pulmonary venous congestion. 2.  No acute pulmonary  disease. Electronically Signed   By: Maisie Fus  Register   On: 08/15/2017 14:21    Procedures Procedures (including critical care time)  Medications Ordered in ED Medications  amLODipine (NORVASC) tablet 5 mg (5 mg Oral Not Given 08/15/17 2250)  lisinopril (PRINIVIL,ZESTRIL) tablet 20 mg (20 mg Oral Not Given 08/15/17 2250)  acetaminophen (TYLENOL) tablet 650 mg (not administered)  ondansetron (ZOFRAN) injection 4 mg (not administered)  insulin aspart (novoLOG) injection 0-9 Units (not administered)  insulin aspart (novoLOG) injection 0-5 Units (0 Units Subcutaneous Not Given 08/15/17 2249)  0.9 %  sodium chloride infusion ( Intravenous New Bag/Given 08/15/17 2259)  enoxaparin (LOVENOX) injection 40 mg (40 mg Subcutaneous Given 08/15/17 2249)  morphine 2 MG/ML injection 2 mg (not administered)  gi cocktail (Maalox,Lidocaine,Donnatal) (not administered)  nitroGLYCERIN (NITROSTAT) SL tablet 0.4 mg (not administered)  hydrALAZINE (APRESOLINE) injection 10 mg (10 mg Intravenous Given 08/15/17 2150)  aspirin chewable tablet 324 mg (324 mg Oral Given 08/15/17 1830)  nicotine (NICODERM CQ - dosed in mg/24 hours) 21 mg/24hr  patch (21 mg  Patch Applied 08/15/17 1841)     Initial Impression / Assessment and Plan / ED Course  I have reviewed the triage vital signs and the nursing notes.  Pertinent labs & imaging results that were available during my care of the patient were reviewed by me and considered in my medical decision making (see chart for details).     Patient is a 52 year old male who presents with a 15-20-minute episode of chest pain associated shortness of breath.  His EKG does not show any acute ischemic changes.  His troponin is minimally elevated.  His other labs are non-concerning other than his glucose is elevated at 213.  Given his risk factors as well as his minimally positive troponin, I do feel that he needs to be admitted for further evaluation. I spoke with Dr. Arlean Hopping who will admit the pt. Final Clinical Impressions(s) / ED Diagnoses   Final diagnoses:  Chest pain, unspecified type    ED Discharge Orders    None       Rolan Bucco, MD 08/16/17 [phone]

## 2017-08-15 NOTE — ED Triage Notes (Signed)
Patient states that about an hour ago he was smoking a cigarette and she started to be SOB and feeling like his throat was sore and closing. The patient then started to have some chest pain. PAtient reports that someone took his BP and it was high.

## 2017-08-15 NOTE — Plan of Care (Signed)
Hospital Plan of Care:  Claudie LeachSteve L Ernsberger is a 52 y.o. male with history of DM 2, hypertension hyperlipidemia [facility] with acute onset of chest pain which resolved approximately 20 minutes after arrival.  Patient received aspirin 324 mg on the ED.  Initial troponin detectable at 0.03.  EKG shows T wave inversions in the lateral leads with downsloping ST segments, Q waves in V1 and biatrial enlargement.  No significant ST depression is noted.  We [facility] for cardiac obs.  Shari HeritageAdam Ross Zayde Stroupe 6:46 PM

## 2017-08-16 ENCOUNTER — Encounter (HOSPITAL_COMMUNITY): Payer: Self-pay | Admitting: Cardiology

## 2017-08-16 ENCOUNTER — Other Ambulatory Visit: Payer: Self-pay

## 2017-08-16 ENCOUNTER — Inpatient Hospital Stay (HOSPITAL_COMMUNITY)
Admission: EM | Disposition: A | Discharge status: Home / Self Care | Payer: Self-pay | Source: Home / Self Care | Attending: Interventional Cardiology

## 2017-08-16 ENCOUNTER — Other Ambulatory Visit (HOSPITAL_COMMUNITY): Payer: PRIVATE HEALTH INSURANCE

## 2017-08-16 DIAGNOSIS — R9431 Abnormal electrocardiogram [ECG] [EKG]: Secondary | ICD-10-CM | POA: Diagnosis not present

## 2017-08-16 DIAGNOSIS — E669 Obesity, unspecified: Secondary | ICD-10-CM

## 2017-08-16 DIAGNOSIS — G4730 Sleep apnea, unspecified: Secondary | ICD-10-CM | POA: Diagnosis present

## 2017-08-16 DIAGNOSIS — I25110 Atherosclerotic heart disease of native coronary artery with unstable angina pectoris: Secondary | ICD-10-CM | POA: Diagnosis present

## 2017-08-16 DIAGNOSIS — D649 Anemia, unspecified: Secondary | ICD-10-CM | POA: Diagnosis present

## 2017-08-16 DIAGNOSIS — Z720 Tobacco use: Secondary | ICD-10-CM

## 2017-08-16 DIAGNOSIS — E7800 Pure hypercholesterolemia, unspecified: Secondary | ICD-10-CM | POA: Diagnosis present

## 2017-08-16 DIAGNOSIS — Z9119 Patient's noncompliance with other medical treatment and regimen: Secondary | ICD-10-CM | POA: Diagnosis not present

## 2017-08-16 DIAGNOSIS — R079 Chest pain, unspecified: Secondary | ICD-10-CM | POA: Diagnosis present

## 2017-08-16 DIAGNOSIS — I420 Dilated cardiomyopathy: Secondary | ICD-10-CM | POA: Diagnosis present

## 2017-08-16 DIAGNOSIS — I2510 Atherosclerotic heart disease of native coronary artery without angina pectoris: Secondary | ICD-10-CM | POA: Diagnosis not present

## 2017-08-16 DIAGNOSIS — E785 Hyperlipidemia, unspecified: Secondary | ICD-10-CM | POA: Diagnosis present

## 2017-08-16 DIAGNOSIS — Z6835 Body mass index (BMI) 35.0-35.9, adult: Secondary | ICD-10-CM | POA: Diagnosis not present

## 2017-08-16 DIAGNOSIS — I10 Essential (primary) hypertension: Secondary | ICD-10-CM | POA: Diagnosis present

## 2017-08-16 DIAGNOSIS — I200 Unstable angina: Secondary | ICD-10-CM

## 2017-08-16 DIAGNOSIS — I160 Hypertensive urgency: Secondary | ICD-10-CM

## 2017-08-16 DIAGNOSIS — E1169 Type 2 diabetes mellitus with other specified complication: Secondary | ICD-10-CM

## 2017-08-16 DIAGNOSIS — R072 Precordial pain: Secondary | ICD-10-CM | POA: Diagnosis not present

## 2017-08-16 DIAGNOSIS — Z8249 Family history of ischemic heart disease and other diseases of the circulatory system: Secondary | ICD-10-CM | POA: Diagnosis not present

## 2017-08-16 DIAGNOSIS — E1165 Type 2 diabetes mellitus with hyperglycemia: Secondary | ICD-10-CM | POA: Diagnosis present

## 2017-08-16 DIAGNOSIS — I2583 Coronary atherosclerosis due to lipid rich plaque: Secondary | ICD-10-CM | POA: Diagnosis not present

## 2017-08-16 DIAGNOSIS — Z833 Family history of diabetes mellitus: Secondary | ICD-10-CM | POA: Diagnosis not present

## 2017-08-16 DIAGNOSIS — F17210 Nicotine dependence, cigarettes, uncomplicated: Secondary | ICD-10-CM | POA: Diagnosis present

## 2017-08-16 HISTORY — PX: CORONARY STENT INTERVENTION: CATH118234

## 2017-08-16 HISTORY — PX: LEFT HEART CATH AND CORONARY ANGIOGRAPHY: CATH118249

## 2017-08-16 LAB — BASIC METABOLIC PANEL
Anion gap: 10 (ref 5–15)
BUN: 9 mg/dL (ref 6–20)
CO2: 22 mmol/L (ref 22–32)
Calcium: 8.9 mg/dL (ref 8.9–10.3)
Chloride: 104 mmol/L (ref 101–111)
Creatinine, Ser: 1 mg/dL (ref 0.61–1.24)
GFR calc Af Amer: >60 mL/min (ref >60)
GFR calc non Af Amer: >60 mL/min (ref >60)
Glucose, Bld: 192 mg/dL — ABNORMAL HIGH (ref 65–99)
Potassium: 3.6 mmol/L (ref 3.5–5.1)
Sodium: 136 mmol/L (ref 135–145)

## 2017-08-16 LAB — CBC WITH DIFFERENTIAL/PLATELET
Basophils Absolute: 0 10*3/uL (ref 0.0–0.1)
Basophils Relative: 0 %
Eosinophils Absolute: 0.1 10*3/uL (ref 0.0–0.7)
Eosinophils Relative: 1 %
HCT: 38.9 % — ABNORMAL LOW (ref 39.0–52.0)
Hemoglobin: 13.1 g/dL (ref 13.0–17.0)
Lymphocytes Relative: 30 %
Lymphs Abs: 2.8 10*3/uL (ref 0.7–4.0)
MCH: 29.6 pg (ref 26.0–34.0)
MCHC: 33.7 g/dL (ref 30.0–36.0)
MCV: 87.8 fL (ref 78.0–100.0)
Monocytes Absolute: 0.5 10*3/uL (ref 0.1–1.0)
Monocytes Relative: 5 %
Neutro Abs: 5.9 10*3/uL (ref 1.7–7.7)
Neutrophils Relative %: 64 %
Platelets: 190 10*3/uL (ref 150–400)
RBC: 4.43 MIL/uL (ref 4.22–5.81)
RDW: 14.3 % (ref 11.5–15.5)
WBC: 9.3 10*3/uL (ref 4.0–10.5)

## 2017-08-16 LAB — RAPID URINE DRUG SCREEN, HOSP PERFORMED
Amphetamines: NOT DETECTED
Barbiturates: NOT DETECTED
Benzodiazepines: NOT DETECTED
Cocaine: NOT DETECTED
Opiates: NOT DETECTED
Tetrahydrocannabinol: NOT DETECTED

## 2017-08-16 LAB — GLUCOSE, CAPILLARY
Glucose-Capillary: 198 mg/dL — ABNORMAL HIGH (ref 65–99)
Glucose-Capillary: 144 mg/dL — ABNORMAL HIGH (ref 65–99)
Glucose-Capillary: 139 mg/dL — ABNORMAL HIGH (ref 65–99)
Glucose-Capillary: 165 mg/dL — ABNORMAL HIGH (ref 65–99)
Glucose-Capillary: 241 mg/dL — ABNORMAL HIGH (ref 65–99)

## 2017-08-16 LAB — D-DIMER, QUANTITATIVE: D-Dimer, Quant: 0.31 ug/mL-FEU (ref 0.00–0.50)

## 2017-08-16 LAB — LIPID PANEL
Cholesterol: 205 mg/dL — ABNORMAL HIGH (ref 0–200)
HDL: 37 mg/dL — ABNORMAL LOW (ref >40)
LDL Cholesterol: 111 mg/dL — ABNORMAL HIGH (ref 0–99)
Total CHOL/HDL Ratio: 5.5 RATIO
Triglycerides: 284 mg/dL — ABNORMAL HIGH (ref <150)
VLDL: 57 mg/dL — ABNORMAL HIGH (ref 0–40)

## 2017-08-16 LAB — HEMOGLOBIN A1C
Hgb A1c MFr Bld: 8.9 % — ABNORMAL HIGH (ref 4.8–5.6)
Mean Plasma Glucose: 208.73 mg/dL

## 2017-08-16 LAB — PROTIME-INR
INR: 0.95
Prothrombin Time: 12.6 seconds (ref 11.4–15.2)

## 2017-08-16 LAB — POCT ACTIVATED CLOTTING TIME: Activated Clotting Time: 395 seconds

## 2017-08-16 LAB — HIV ANTIBODY (ROUTINE TESTING W REFLEX): HIV Screen 4th Generation wRfx: NONREACTIVE

## 2017-08-16 LAB — TROPONIN I
Troponin I: <0.03 ng/mL (ref <0.03)
Troponin I: <0.03 ng/mL (ref <0.03)

## 2017-08-16 SURGERY — LEFT HEART CATH AND CORONARY ANGIOGRAPHY
Anesthesia: LOCAL

## 2017-08-16 MED ORDER — VERAPAMIL HCL 2.5 MG/ML IV SOLN
INTRAVENOUS | Status: DC | PRN
  Administered 2017-08-16: 10 mL via INTRA_ARTERIAL

## 2017-08-16 MED ORDER — METOPROLOL TARTRATE 5 MG/5ML IV SOLN
INTRAVENOUS | Status: AC
  Filled 2017-08-16: qty 5

## 2017-08-16 MED ORDER — HEPARIN (PORCINE) IN NACL 2-0.9 UNIT/ML-% IJ SOLN
INTRAMUSCULAR | Status: AC
  Filled 2017-08-16: qty 500

## 2017-08-16 MED ORDER — ATORVASTATIN CALCIUM 80 MG PO TABS: 80.0000 mg | ORAL_TABLET | Freq: Every day | ORAL | Status: DC

## 2017-08-16 MED ORDER — THE SENSUOUS HEART BOOK
Freq: Once | Status: AC
  Administered 2017-08-16: 22:00:00
  Filled 2017-08-16: qty 1

## 2017-08-16 MED ORDER — FENTANYL CITRATE (PF) 100 MCG/2ML IJ SOLN
INTRAMUSCULAR | Status: DC | PRN
  Administered 2017-08-16: 50 ug via INTRAVENOUS

## 2017-08-16 MED ORDER — IOPAMIDOL (ISOVUE-370) INJECTION 76%
INTRAVENOUS | Status: AC
  Filled 2017-08-16: qty 100

## 2017-08-16 MED ORDER — ACETAMINOPHEN 325 MG PO TABS: 650.0000 mg | ORAL_TABLET | ORAL | Status: DC | PRN

## 2017-08-16 MED ORDER — MIDAZOLAM HCL 2 MG/2ML IJ SOLN
INTRAMUSCULAR | Status: AC
  Filled 2017-08-16: qty 2

## 2017-08-16 MED ORDER — SODIUM CHLORIDE 0.9% FLUSH: 3.0000 mL | INTRAVENOUS | Status: DC | PRN

## 2017-08-16 MED ORDER — TIROFIBAN HCL IN NACL 5-0.9 MG/100ML-% IV SOLN
INTRAVENOUS | Status: AC
  Filled 2017-08-16: qty 100

## 2017-08-16 MED ORDER — SODIUM CHLORIDE 0.9 % WEIGHT BASED INFUSION
3.0000 mL/kg/h | INTRAVENOUS | Status: DC
  Administered 2017-08-16: 3 mL/kg/h via INTRAVENOUS

## 2017-08-16 MED ORDER — MIDAZOLAM HCL 2 MG/2ML IJ SOLN
INTRAMUSCULAR | Status: DC | PRN
  Administered 2017-08-16: 1 mg via INTRAVENOUS

## 2017-08-16 MED ORDER — ONDANSETRON HCL 4 MG/2ML IJ SOLN: 4.0000 mg | Freq: Four times a day (QID) | INTRAMUSCULAR | Status: DC | PRN

## 2017-08-16 MED ORDER — SODIUM CHLORIDE 0.9 % WEIGHT BASED INFUSION: 3.0000 mL/kg/h | INTRAVENOUS | Status: DC

## 2017-08-16 MED ORDER — VERAPAMIL HCL 2.5 MG/ML IV SOLN
INTRAVENOUS | Status: AC
  Filled 2017-08-16: qty 2

## 2017-08-16 MED ORDER — HEPARIN SODIUM (PORCINE) 1000 UNIT/ML IJ SOLN
INTRAMUSCULAR | Status: AC
  Filled 2017-08-16: qty 1

## 2017-08-16 MED ORDER — SODIUM CHLORIDE 0.9 % WEIGHT BASED INFUSION: 1.0000 mL/kg/h | INTRAVENOUS | Status: DC

## 2017-08-16 MED ORDER — HYDRALAZINE HCL 20 MG/ML IJ SOLN: 5.0000 mg | INTRAMUSCULAR | Status: AC | PRN

## 2017-08-16 MED ORDER — METOPROLOL TARTRATE 5 MG/5ML IV SOLN
INTRAVENOUS | Status: DC | PRN
  Administered 2017-08-16: 5 mg via INTRAVENOUS

## 2017-08-16 MED ORDER — FENTANYL CITRATE (PF) 100 MCG/2ML IJ SOLN
INTRAMUSCULAR | Status: AC
  Filled 2017-08-16: qty 2

## 2017-08-16 MED ORDER — TICAGRELOR 90 MG PO TABS
ORAL_TABLET | ORAL | Status: DC | PRN
  Administered 2017-08-16: 180 mg via ORAL

## 2017-08-16 MED ORDER — IOPAMIDOL (ISOVUE-370) INJECTION 76%
INTRAVENOUS | Status: DC | PRN
  Administered 2017-08-16: 105 mL via INTRAVENOUS

## 2017-08-16 MED ORDER — ASPIRIN 81 MG PO CHEW
81.0000 mg | CHEWABLE_TABLET | Freq: Every day | ORAL | Status: DC
  Administered 2017-08-17: 81 mg via ORAL
  Filled 2017-08-16: qty 1

## 2017-08-16 MED ORDER — TICAGRELOR 90 MG PO TABS
ORAL_TABLET | ORAL | Status: AC
  Filled 2017-08-16: qty 2

## 2017-08-16 MED ORDER — HEPARIN SODIUM (PORCINE) 1000 UNIT/ML IJ SOLN
INTRAMUSCULAR | Status: DC | PRN
  Administered 2017-08-16 (×2): 6000 [IU] via INTRAVENOUS

## 2017-08-16 MED ORDER — ANGIOPLASTY BOOK
Freq: Once | Status: AC
  Administered 2017-08-16: 22:00:00
  Filled 2017-08-16: qty 1

## 2017-08-16 MED ORDER — LIDOCAINE HCL (PF) 1 % IJ SOLN
INTRAMUSCULAR | Status: DC | PRN
  Administered 2017-08-16: 3 mL

## 2017-08-16 MED ORDER — LABETALOL HCL 5 MG/ML IV SOLN: 10.0000 mg | INTRAVENOUS | Status: AC | PRN

## 2017-08-16 MED ORDER — IPRATROPIUM-ALBUTEROL 0.5-2.5 (3) MG/3ML IN SOLN: 3.0000 mL | RESPIRATORY_TRACT | Status: DC | PRN

## 2017-08-16 MED ORDER — TIROFIBAN (AGGRASTAT) BOLUS VIA INFUSION
INTRAVENOUS | Status: DC | PRN
  Administered 2017-08-16: 2842.5 ug via INTRAVENOUS

## 2017-08-16 MED ORDER — SODIUM CHLORIDE 0.9% FLUSH
3.0000 mL | Freq: Two times a day (BID) | INTRAVENOUS | Status: DC
  Administered 2017-08-16: 3 mL via INTRAVENOUS

## 2017-08-16 MED ORDER — TIROFIBAN HCL IN NACL 5-0.9 MG/100ML-% IV SOLN
INTRAVENOUS | Status: DC | PRN
  Administered 2017-08-16: 0.15 ug/kg/min via INTRAVENOUS

## 2017-08-16 MED ORDER — SODIUM CHLORIDE 0.9 % IV SOLN: 250.0000 mL | INTRAVENOUS | Status: DC | PRN

## 2017-08-16 MED ORDER — ATORVASTATIN CALCIUM 80 MG PO TABS
80.0000 mg | ORAL_TABLET | ORAL | Status: AC
  Administered 2017-08-16: 80 mg via ORAL
  Filled 2017-08-16: qty 1

## 2017-08-16 MED ORDER — LIDOCAINE HCL (PF) 1 % IJ SOLN
INTRAMUSCULAR | Status: AC
  Filled 2017-08-16: qty 30

## 2017-08-16 MED ORDER — ASPIRIN 81 MG PO CHEW: 81.0000 mg | CHEWABLE_TABLET | ORAL | Status: DC

## 2017-08-16 MED ORDER — IOPAMIDOL (ISOVUE-370) INJECTION 76%
INTRAVENOUS | Status: AC
  Filled 2017-08-16: qty 50

## 2017-08-16 MED ORDER — SODIUM CHLORIDE 0.9% FLUSH
3.0000 mL | Freq: Two times a day (BID) | INTRAVENOUS | Status: DC
  Administered 2017-08-17: 3 mL via INTRAVENOUS

## 2017-08-16 MED ORDER — CARVEDILOL 3.125 MG PO TABS
6.2500 mg | ORAL_TABLET | Freq: Two times a day (BID) | ORAL | Status: DC
  Administered 2017-08-16 – 2017-08-17 (×3): 6.25 mg via ORAL
  Filled 2017-08-16: qty 1
  Filled 2017-08-16 (×2): qty 2

## 2017-08-16 MED ORDER — HEPARIN (PORCINE) IN NACL 2-0.9 UNIT/ML-% IJ SOLN
INTRAMUSCULAR | Status: DC | PRN
  Administered 2017-08-16 (×2): 500 mL

## 2017-08-16 MED ORDER — TICAGRELOR 90 MG PO TABS
90.0000 mg | ORAL_TABLET | Freq: Two times a day (BID) | ORAL | Status: DC
  Administered 2017-08-17 (×2): 90 mg via ORAL
  Filled 2017-08-16 (×2): qty 1

## 2017-08-16 MED ORDER — SODIUM CHLORIDE 0.9 % IV SOLN: INTRAVENOUS | Status: AC

## 2017-08-16 MED ORDER — ASPIRIN 81 MG PO CHEW
81.0000 mg | CHEWABLE_TABLET | Freq: Once | ORAL | Status: AC
  Administered 2017-08-16: 81 mg via ORAL
  Filled 2017-08-16: qty 1

## 2017-08-16 SURGICAL SUPPLY — 19 items
BALLN SAPPHIRE 2.5X12 (BALLOONS) ×2
BALLN ~~LOC~~ EMERGE MR 3.25X8 (BALLOONS) ×2
BALLOON SAPPHIRE 2.5X12 (BALLOONS) ×1 IMPLANT
BALLOON ~~LOC~~ EMERGE MR 3.25X8 (BALLOONS) ×1 IMPLANT
BAND ZEPHYR COMPRESS 30 LONG (HEMOSTASIS) ×4 IMPLANT
CATH INFINITI 5 FR JL3.5 (CATHETERS) ×2 IMPLANT
CATH INFINITI JR4 5F (CATHETERS) ×2 IMPLANT
CATH VISTA GUIDE 6FR XBRCA (CATHETERS) ×2 IMPLANT
GUIDEWIRE INQWIRE 1.5J.035X260 (WIRE) ×1 IMPLANT
INQWIRE 1.5J .035X260CM (WIRE) ×2
KIT ENCORE 26 ADVANTAGE (KITS) ×2 IMPLANT
KIT HEART LEFT (KITS) ×2 IMPLANT
KIT HEMO VALVE WATCHDOG (MISCELLANEOUS) ×2 IMPLANT
NEEDLE PERC 21GX4CM (NEEDLE) ×2 IMPLANT
PACK CARDIAC CATHETERIZATION (CUSTOM PROCEDURE TRAY) ×2 IMPLANT
SHEATH RAIN RADIAL 21G 6FR (SHEATH) ×2 IMPLANT
STENT SYNERGY DES 3X16 (Permanent Stent) ×2 IMPLANT
TRANSDUCER W/STOPCOCK (MISCELLANEOUS) ×2 IMPLANT
TUBING CIL FLEX 10 FLL-RA (TUBING) ×2 IMPLANT

## 2017-08-16 NOTE — Consult Note (Signed)
Cardiology Consultation:   Patient ID: Clifford Frederick; [id number]; Dec 19, 1965   Admit date: 08/15/2017 Date of Consult: 08/16/2017  Primary Care Provider: Patient, No Pcp Per Primary Cardiologist: Armanda Magic, MD - New  Patient Profile:   JARREN PARA is a 52 y.o. male with a hx of HTN, HLD, DM type 2, sleep apnea and tobacco abuse who is being seen today for the evaluation of chest pain at the request of Dr. Benjamine Mola.  History of Present Illness:   Mr. Tangeman has not seen a PCP in at least a year and a half and not taken any of his meds in a year or so. He smokes about 1/2-1 pack of cigarettes per day. Mr. Duerson has a history of sleep apnea for which CPAP was recommended but he did not get it at the time as his insurance wouldn't pay. Mr. Perry denies any previous heart attack, stroke or cardiac testing. He does not have a cardiologist.  Burgess Estelle while at work around 1 PM he went outside to smoke and developed shortness of breath. He went outside to walk around and developed chest tightness, nonradiating. He did not have any nausea, diaphoresis, palpitations. His boss brought him back inside to sit him down at which time his chest tightness resolved. His boss did check his blood pressure which was reportedly very high. The patient then reported to the hospital. He has had no recurrences of the chest tightness and his shortness of breath had resolved when he got to the emergency room. This is his first episode of chest tightness. Mr. Centrella denies any orthopnea, PND or edema.  Mr. Honeywell has a family history significant for hypertension and diabetes in his mother and his brother. His father died when he was very young and he doesn't know his health history at all. He has a sister whose health history he is unaware of. The patient denies any heart attack or stroke in any family members.  Past Medical History:  Diagnosis Date  . Diabetes mellitus without complication (HCC)    but doesn't take his  meds and hasn't in months  . High cholesterol   . Hypertension   . Sleep apnea     Past Surgical History:  Procedure Laterality Date  . NO PAST SURGERIES    . ORIF ANKLE FRACTURE Left 08/07/2012   Procedure: OPEN REDUCTION INTERNAL FIXATION (ORIF) LEFT ANKLE FRACTURE;  Surgeon: Kathryne Hitch, MD;  Location: [facility] OR;  Service: Orthopedics;  Laterality: Left;     Home Medications:  Prior to Admission medications   Medication Sig Start Date End Date Taking? Authorizing Provider  amLODipine (NORVASC) 5 MG tablet Take 1 tablet (5 mg total) by mouth daily. 08/11/16   [name], MD  lisinopril (PRINIVIL,ZESTRIL) 20 MG tablet Take 1 tablet (20 mg total) by mouth daily. 08/11/16   [name], MD  metFORMIN (GLUCOPHAGE) 1000 MG tablet Take 1 tablet (1,000 mg total) by mouth 2 (two) times daily. 08/11/16   [name], MD    Inpatient Medications: Scheduled Meds: . amLODipine  5 mg Oral Daily  . enoxaparin (LOVENOX) injection  40 mg Subcutaneous Q24H  . insulin aspart  0-5 Units Subcutaneous QHS  . insulin aspart  0-9 Units Subcutaneous TID WC  . lisinopril  20 mg Oral Daily   Continuous Infusions:  PRN Meds: acetaminophen, gi cocktail, hydrALAZINE, ipratropium-albuterol, morphine injection, nitroGLYCERIN, ondansetron (ZOFRAN) IV  Allergies:   No Known Allergies  Social History:   Social History  Socioeconomic History  . Marital status: Married    Spouse name: Not on file  . Number of children: Not on file  . Years of education: Not on file  . Highest education level: Not on file  Social Needs  . Financial resource strain: Not on file  . Food insecurity - worry: Not on file  . Food insecurity - inability: Not on file  . Transportation needs - medical: Not on file  . Transportation needs - non-medical: Not on file  Occupational History  . Not on file  Tobacco Use  . Smoking status: Current Every Day Smoker    Packs/day: 1.00    Types: Cigarettes  . Smokeless  tobacco: Never Used  Substance and Sexual Activity  . Alcohol use: Yes    Comment: occ  . Drug use: No  . Sexual activity: Not on file  Other Topics Concern  . Not on file  Social History Narrative  . Not on file    Family History:     Family History  Problem Relation Age of Onset  . Diabetes Mother   . Hypertension Mother   . Diabetes Brother   . Hypertension Brother      ROS:  Please see the history of present illness.   All other ROS reviewed and negative.     Physical Exam/Data:   Vitals:   08/15/17 2350 08/16/17 0020 08/16/17 0550 08/16/17 0605  BP: (!) 156/99 (!) 164/103 (!) 175/93 (!) 171/100  Pulse:    95  Resp:    (!) 30  Temp:    98 F (36.7 C)  TempSrc:    Oral  SpO2:    97%  Weight:      Height:        Intake/Output Summary (Last 24 hours) at 08/16/2017 0758 Last data filed at 08/16/2017 0500 Gross per 24 hour  Intake --  Output 1300 ml  Net -1300 ml   Filed Weights   08/15/17 1404 08/15/17 2100  Weight: 250 lb (113.4 kg) 250 lb 11.2 oz (113.7 kg)   Body mass index is 35.97 kg/m.  General:  Well nourished, well developed, in no acute distress HEENT: normal Lymph: no adenopathy Neck: no JVD Endocrine:  No thryomegaly Vascular: No carotid bruits; FA pulses 2+ bilaterally without bruits  Cardiac:  normal S1, S2; regular rhythm, tachycardic, occ early beat; no murmur  Lungs:  clear to auscultation bilaterally, no wheezing, rhonchi or rales  Abd: soft, nontender, no hepatomegaly  Ext: no edema Musculoskeletal:  No deformities, BUE and BLE strength normal and equal Skin: warm and dry  Neuro:  CNs 2-12 intact, no focal abnormalities noted Psych:  Normal affect   EKG:  The EKG was personally reviewed and demonstrates:  Sinus tachycardia 102 bpm, abnormal r wave progression, TWI inferior and laterally Telemetry:  Telemetry was personally reviewed and demonstrates:  Sinus rhythm/sinus tachycardia with rates in the 90s to low 100s  Relevant CV  Studies:  Echocardiogram pending  Laboratory Data:  Chemistry Recent Labs  Lab 08/15/17 1722 08/16/17 0344  NA 136 136  K 3.9 3.6  CL 103 104  CO2 25 22  GLUCOSE 213* 192*  BUN 15 9  CREATININE 1.18 1.00  CALCIUM 8.9 8.9  GFRNONAA >60 >60  GFRAA >60 >60  ANIONGAP 8 10    No results for input(s): PROT, ALBUMIN, AST, ALT, ALKPHOS, BILITOT in the last 168 hours. Hematology Recent Labs  Lab 08/15/17 1722 08/16/17 0344  WBC 8.9 9.3  RBC 4.25 4.43  HGB 12.5* 13.1  HCT 36.9* 38.9*  MCV 86.8 87.8  MCH 29.4 29.6  MCHC 33.9 33.7  RDW 13.8 14.3  PLT 172 190   Cardiac Enzymes Recent Labs  Lab 08/15/17 1722 08/15/17 2143 08/16/17 0020 08/16/17 0344  TROPONINI 0.03* <0.03 <0.03 <0.03   No results for input(s): TROPIPOC in the last 168 hours.  BNPNo results for input(s): BNP, PROBNP in the last 168 hours.  DDimer  Recent Labs  Lab 08/16/17 0020  DDIMER 0.31    Radiology/Studies:  Dg Chest 2 View  Result Date: 08/15/2017 CLINICAL DATA:  Shortness of breath.  Chest tightness. EXAM: CHEST - 2 VIEW COMPARISON:  08/11/2016. FINDINGS: Mediastinum and hilar structures normal. Lungs are clear. No pleural effusion or pneumothorax. Borderline cardiomegaly with normal pulmonary vascularity. IMPRESSION: 1.  Borderline cardiomegaly.  No pulmonary venous congestion. 2.  No acute pulmonary disease. Electronically Signed   By: Maisie Fus  Register   On: 08/15/2017 14:21    Assessment and Plan:   Chest pain -CVD risk factors include HTN, HLD, DM type 2 and tobacco abuse. Also has untreated sleep apnea. -patient developed new onset chest tightness with shortness of breath, no radiation of discomfort, diaphoresis, nausea, lightheadedness. No chest discomfort or shortness of breath through the night. -Troponin initially very mildly elevated at 0.03 followed by <0.03 X 3 -EKG with abnormal R wave progression and TWI inferior and laterally -Borderline cardiomegaly on CXR -this patient  certainly has risk factors for cardiovascular disease. His symptoms may be related to uncontrolled hypertension versus myocardial ischemia. -the patient will need an ischemic evaluation. I will discuss nuclear stress test versus coronary CTA versus cath with Dr. Mayford Knife. The patient is nothing by mouth.  Hypertension -recently untreated. Outpatient therapy amlodipine 5 mg daily, lisinopril 20 mg daily -Blood pressure is elevated. 188/133 on presentation. Home meds initiated. IV Hydralazine as needed. Continue to monitor and adjust therapy as needed. Goal blood pressure less than 130/80. -echocardiogram has been ordered. Will evaluate for hypertension related cardiomyopathies.  Hyperlipidemia -LDL 111. The patient may need treatment pending cardiac evaluation.  Diabetes Type 2 -Has not been taking his metformin.  -Poorly controlled with A1c 8.9 -patient will need to be compliant with diabetes treatment and follow-up  Tobacco abuse -The patient smokes approximately one half to one pack per day -Effects of smoking on the heart have been discussed with the patient. He expresses desire to quit smoking and states that he was given nicotine patches at the urgent care.   For questions or updates, please contact [facility] Please consult [email or web address] for contact info under Cardiology/STEMI.   Signed, Berton Bon, NP  08/16/2017 7:58 AM

## 2017-08-16 NOTE — Progress Notes (Signed)
TR BAND REMOVAL  LOCATION:    right radial  DEFLATED PER PROTOCOL:    Yes.    TIME BAND OFF / DRESSING APPLIED:    1815   SITE UPON ARRIVAL:    Level 1  SITE AFTER BAND REMOVAL:    Level 1  CIRCULATION SENSATION AND MOVEMENT:    Within Normal Limits   Yes.    COMMENTS:   Bruising and small hematoma noted under 2nd band when removed. Pressure held to area until area soft, slight bruising noted but no hematoma. Site unchanged when rechecked.

## 2017-08-16 NOTE — Interval H&P Note (Signed)
Cath Lab Visit (complete for each Cath Lab visit)  Clinical Evaluation Leading to the Procedure:   ACS: Yes.    Non-ACS:    Anginal Classification: CCS IV  Anti-ischemic medical therapy: Minimal Therapy (1 class of medications)  Non-Invasive Test Results: No non-invasive testing performed  Prior CABG: No previous CABG      History and Physical Interval Note:  08/16/2017 12:07 PM  Claudie LeachSteve L Frederick  has presented today for surgery, with the diagnosis of ua  The various methods of treatment have been discussed with the patient and family. After consideration of risks, benefits and other options for treatment, the patient has consented to  Procedure(s): LEFT HEART CATH AND CORONARY ANGIOGRAPHY (N/A) as a surgical intervention .  The patient's history has been reviewed, patient examined, no change in status, stable for surgery.  I have reviewed the patient's chart and labs.  Questions were answered to the patient's satisfaction.     Lance MussJayadeep Divina Neale

## 2017-08-16 NOTE — Progress Notes (Signed)
Per rep at [employer]:  Marden NobleBrilinta is covered, tier 4, no auth required, patient has $0 deductible, co-pay at retail is $200

## 2017-08-16 NOTE — Progress Notes (Signed)
PROGRESS NOTE    Clifford LeachSteve L Frederick  ZOX:[id number] DOB: [birth year] DOA: 08/15/2017 PCP: Patient, No Pcp Per   Outpatient Specialists:     Brief Narrative:   Clifford Frederick is a 52 y.o. male with medical history significant of HTN, HLD,  DM type II, and tobacco abuse; who presents with complaints of chest tightness.  Patient reports being at work and had been smoking a cigarette at the time of onset.  Reports having a tightness feeling across his chest that did not radiate, and caused him to feel short of breath.  Tried going outside to get some air as well as drinking water without relief of symptoms.  Symptoms lasted approximately 20 minutes and then self resolved.  He is non-compliant with BP and diabetic medications.      Assessment & Plan:   Principal Problem:   Chest pain Active Problems:   Tobacco abuse   Hypertensive urgency   Diabetes mellitus type 2 in obese (HCC)   Normocytic anemia   Chest pain with elevated troponin/unstable angina: Acute.  Patient presents with complaints of chest pain found to have elevated troponin of 0.03.   -d dimer negative - Nitroglycerin prn chest pain - plan if for heart cath today  Hypertensive urgency: Acute.  Blood pressures noted to be as high as 188/133 on admission. - Restart amlodipine and lisinopril-- ordered for last PM but not given - Hydralazine IV prn >sBP/dBP-- given 3 times overnight  Diabetes mellitus type 2 with hyperglycemia:  -not taking previously prescribed oral medications of metformin.  - CBGs q. before meals and at bedtime with sensitive SSI -HgbA1c>8.9 -will need medications upon d/c  Hyperlipidemia - LDL mildly elevated -statin  Normocytic anemia: Hemoglobin stable at 12.5 g/dL. - Continue to monitor  Sleep apnea -per patient, insurance would not pay for CPAP  Obesity:  Body mass index is 35.97 kg/m.  Tobacco abuse - Counseled on the need of cessation of tobacco -patch added   DVT prophylaxis:    Lovenox   Code Status: Full Code   Family Communication: none  Disposition Plan:  After cardiology evaluation   Consultants:   cardiology   Subjective: hungry  Objective: Vitals:   08/15/17 2350 08/16/17 0020 08/16/17 0550 08/16/17 0605  BP: (!) 156/99 (!) 164/103 (!) 175/93 (!) 171/100  Pulse:    95  Resp:    (!) 30  Temp:    98 F (36.7 C)  TempSrc:    Oral  SpO2:    97%  Weight:      Height:        Intake/Output Summary (Last 24 hours) at 08/16/2017 0913 Last data filed at 08/16/2017 0842 Gross per 24 hour  Intake 0 ml  Output 1700 ml  Net -1700 ml   Filed Weights   08/15/17 1404 08/15/17 2100  Weight: 113.4 kg (250 lb) 113.7 kg (250 lb 11.2 oz)    Examination:  General exam: Appears calm and comfortable - obese Respiratory system: Clear to auscultation. Respiratory effort normal. Cardiovascular system: S1 & S2 heard, RRR. No JVD, murmurs, rubs, gallops or clicks. No pedal edema. Gastrointestinal system: Abdomen is nondistended, soft and nontender. No organomegaly or masses felt. Normal bowel sounds heard. Central nervous system: Alert and oriented. No focal neurological deficits. Extremities: Symmetric 5 x 5 power. Skin: No rashes, lesions or ulcers Psychiatry: Judgement and insight appear normal. Mood & affect appropriate.     Data Reviewed: I have personally reviewed following labs and imaging studies  CBC: Recent Labs  Lab 08/15/17 1722 08/16/17 0344  WBC 8.9 9.3  NEUTROABS 5.1 5.9  HGB 12.5* 13.1  HCT 36.9* 38.9*  MCV 86.8 87.8  PLT 172 190   Basic Metabolic Panel: Recent Labs  Lab 08/15/17 1722 08/16/17 0344  NA 136 136  K 3.9 3.6  CL 103 104  CO2 25 22  GLUCOSE 213* 192*  BUN 15 9  CREATININE 1.18 1.00  CALCIUM 8.9 8.9   GFR: Estimated Creatinine Clearance: 110.4 mL/min (by C-G formula based on SCr of 1 mg/dL). Liver Function Tests: No results for input(s): AST, ALT, ALKPHOS, BILITOT, PROT, ALBUMIN in the last 168  hours. No results for input(s): LIPASE, AMYLASE in the last 168 hours. No results for input(s): AMMONIA in the last 168 hours. Coagulation Profile: No results for input(s): INR, PROTIME in the last 168 hours. Cardiac Enzymes: Recent Labs  Lab 08/15/17 1722 08/15/17 2143 08/16/17 0020 08/16/17 0344  TROPONINI 0.03* <0.03 <0.03 <0.03   BNP (last 3 results) No results for input(s): PROBNP in the last 8760 hours. HbA1C: Recent Labs    08/16/17 0344  HGBA1C 8.9*   CBG: Recent Labs  Lab 08/15/17 2246 08/16/17 0747  GLUCAP 188* 198*   Lipid Profile: Recent Labs    08/16/17 0344  CHOL 205*  HDL 37*  LDLCALC 111*  TRIG 284*  CHOLHDL 5.5   Thyroid Function Tests: No results for input(s): TSH, T4TOTAL, FREET4, T3FREE, THYROIDAB in the last 72 hours. Anemia Panel: No results for input(s): VITAMINB12, FOLATE, FERRITIN, TIBC, IRON, RETICCTPCT in the last 72 hours. Urine analysis:    Component Value Date/Time   COLORURINE YELLOW 04/06/2007 0709   APPEARANCEUR CLEAR 04/06/2007 0709   LABSPEC 1.018 04/06/2007 0709   PHURINE 7.0 04/06/2007 0709   GLUCOSEU NEGATIVE 04/06/2007 0709   HGBUR NEGATIVE 04/06/2007 0709   BILIRUBINUR NEGATIVE 04/06/2007 0709   KETONESUR NEGATIVE 04/06/2007 0709   PROTEINUR NEGATIVE 04/06/2007 0709   UROBILINOGEN 1.0 04/06/2007 0709   NITRITE NEGATIVE 04/06/2007 0709   LEUKOCYTESUR  04/06/2007 0709    NEGATIVE MICROSCOPIC NOT DONE ON URINES WITH NEGATIVE PROTEIN, BLOOD, LEUKOCYTES, NITRITE, OR GLUCOSE <1000 mg/dL.     )No results found for this or any previous visit (from the past 240 hour(s)).    Anti-infectives (From admission, onward)   None       Radiology Studies: Dg Chest 2 View  Result Date: 08/15/2017 CLINICAL DATA:  Shortness of breath.  Chest tightness. EXAM: CHEST - 2 VIEW COMPARISON:  08/11/2016. FINDINGS: Mediastinum and hilar structures normal. Lungs are clear. No pleural effusion or pneumothorax. Borderline cardiomegaly  with normal pulmonary vascularity. IMPRESSION: 1.  Borderline cardiomegaly.  No pulmonary venous congestion. 2.  No acute pulmonary disease. Electronically Signed   By: Maisie Fus  Register   On: 08/15/2017 14:21        Scheduled Meds: . amLODipine  5 mg Oral Daily  . enoxaparin (LOVENOX) injection  40 mg Subcutaneous Q24H  . insulin aspart  0-5 Units Subcutaneous QHS  . insulin aspart  0-9 Units Subcutaneous TID WC  . lisinopril  20 mg Oral Daily   Continuous Infusions:   LOS: 0 days    Time spent: 35 min    Joseph Art, DO [facility] Pager [phone]  If 7PM-7AM, please contact night-coverage [email or web address] Password [facility] 08/16/2017, 9:13 AM

## 2017-08-16 NOTE — H&P (View-Only) (Signed)
Cardiology Consultation:   Patient ID: EDWARDS MCKELVIE; [id number]; Dec 19, 1965   Admit date: 08/15/2017 Date of Consult: 08/16/2017  Primary Care Provider: Patient, No Pcp Per Primary Cardiologist: Armanda Magic, MD - New  Patient Profile:   Clifford Frederick is a 52 y.o. male with a hx of HTN, HLD, DM type 2, sleep apnea and tobacco abuse who is being seen today for the evaluation of chest pain at the request of Dr. Benjamine Mola.  History of Present Illness:   Mr. Tangeman has not seen a PCP in at least a year and a half and not taken any of his meds in a year or so. He smokes about 1/2-1 pack of cigarettes per day. Mr. Duerson has a history of sleep apnea for which CPAP was recommended but he did not get it at the time as his insurance wouldn't pay. Mr. Perry denies any previous heart attack, stroke or cardiac testing. He does not have a cardiologist.  Burgess Estelle while at work around 1 PM he went outside to smoke and developed shortness of breath. He went outside to walk around and developed chest tightness, nonradiating. He did not have any nausea, diaphoresis, palpitations. His boss brought him back inside to sit him down at which time his chest tightness resolved. His boss did check his blood pressure which was reportedly very high. The patient then reported to the hospital. He has had no recurrences of the chest tightness and his shortness of breath had resolved when he got to the emergency room. This is his first episode of chest tightness. Mr. Centrella denies any orthopnea, PND or edema.  Mr. Honeywell has a family history significant for hypertension and diabetes in his mother and his brother. His father died when he was very young and he doesn't know his health history at all. He has a sister whose health history he is unaware of. The patient denies any heart attack or stroke in any family members.  Past Medical History:  Diagnosis Date  . Diabetes mellitus without complication (HCC)    but doesn't take his  meds and hasn't in months  . High cholesterol   . Hypertension   . Sleep apnea     Past Surgical History:  Procedure Laterality Date  . NO PAST SURGERIES    . ORIF ANKLE FRACTURE Left 08/07/2012   Procedure: OPEN REDUCTION INTERNAL FIXATION (ORIF) LEFT ANKLE FRACTURE;  Surgeon: Kathryne Hitch, MD;  Location: [facility] OR;  Service: Orthopedics;  Laterality: Left;     Home Medications:  Prior to Admission medications   Medication Sig Start Date End Date Taking? Authorizing Provider  amLODipine (NORVASC) 5 MG tablet Take 1 tablet (5 mg total) by mouth daily. 08/11/16   [name], MD  lisinopril (PRINIVIL,ZESTRIL) 20 MG tablet Take 1 tablet (20 mg total) by mouth daily. 08/11/16   [name], MD  metFORMIN (GLUCOPHAGE) 1000 MG tablet Take 1 tablet (1,000 mg total) by mouth 2 (two) times daily. 08/11/16   [name], MD    Inpatient Medications: Scheduled Meds: . amLODipine  5 mg Oral Daily  . enoxaparin (LOVENOX) injection  40 mg Subcutaneous Q24H  . insulin aspart  0-5 Units Subcutaneous QHS  . insulin aspart  0-9 Units Subcutaneous TID WC  . lisinopril  20 mg Oral Daily   Continuous Infusions:  PRN Meds: acetaminophen, gi cocktail, hydrALAZINE, ipratropium-albuterol, morphine injection, nitroGLYCERIN, ondansetron (ZOFRAN) IV  Allergies:   No Known Allergies  Social History:   Social History  Socioeconomic History  . Marital status: Married    Spouse name: Not on file  . Number of children: Not on file  . Years of education: Not on file  . Highest education level: Not on file  Social Needs  . Financial resource strain: Not on file  . Food insecurity - worry: Not on file  . Food insecurity - inability: Not on file  . Transportation needs - medical: Not on file  . Transportation needs - non-medical: Not on file  Occupational History  . Not on file  Tobacco Use  . Smoking status: Current Every Day Smoker    Packs/day: 1.00    Types: Cigarettes  . Smokeless  tobacco: Never Used  Substance and Sexual Activity  . Alcohol use: Yes    Comment: occ  . Drug use: No  . Sexual activity: Not on file  Other Topics Concern  . Not on file  Social History Narrative  . Not on file    Family History:     Family History  Problem Relation Age of Onset  . Diabetes Mother   . Hypertension Mother   . Diabetes Brother   . Hypertension Brother      ROS:  Please see the history of present illness.   All other ROS reviewed and negative.     Physical Exam/Data:   Vitals:   08/15/17 2350 08/16/17 0020 08/16/17 0550 08/16/17 0605  BP: (!) 156/99 (!) 164/103 (!) 175/93 (!) 171/100  Pulse:    95  Resp:    (!) 30  Temp:    98 F (36.7 C)  TempSrc:    Oral  SpO2:    97%  Weight:      Height:        Intake/Output Summary (Last 24 hours) at 08/16/2017 0758 Last data filed at 08/16/2017 0500 Gross per 24 hour  Intake -  Output 1300 ml  Net -1300 ml   Filed Weights   08/15/17 1404 08/15/17 2100  Weight: 250 lb (113.4 kg) 250 lb 11.2 oz (113.7 kg)   Body mass index is 35.97 kg/m.  General:  Well nourished, well developed, in no acute distress HEENT: normal Lymph: no adenopathy Neck: no JVD Endocrine:  No thryomegaly Vascular: No carotid bruits; FA pulses 2+ bilaterally without bruits  Cardiac:  normal S1, S2; regular rhythm, tachycardic, occ early beat; no murmur  Lungs:  clear to auscultation bilaterally, no wheezing, rhonchi or rales  Abd: soft, nontender, no hepatomegaly  Ext: no edema Musculoskeletal:  No deformities, BUE and BLE strength normal and equal Skin: warm and dry  Neuro:  CNs 2-12 intact, no focal abnormalities noted Psych:  Normal affect   EKG:  The EKG was personally reviewed and demonstrates:  Sinus tachycardia 102 bpm, abnormal r wave progression, TWI inferior and laterally Telemetry:  Telemetry was personally reviewed and demonstrates:  Sinus rhythm/sinus tachycardia with rates in the 90s to low 100s  Relevant CV  Studies:  Echocardiogram pending  Laboratory Data:  Chemistry Recent Labs  Lab 08/15/17 1722 08/16/17 0344  NA 136 136  K 3.9 3.6  CL 103 104  CO2 25 22  GLUCOSE 213* 192*  BUN 15 9  CREATININE 1.18 1.00  CALCIUM 8.9 8.9  GFRNONAA >60 >60  GFRAA >60 >60  ANIONGAP 8 10    No results for input(s): PROT, ALBUMIN, AST, ALT, ALKPHOS, BILITOT in the last 168 hours. Hematology Recent Labs  Lab 08/15/17 1722 08/16/17 0344  WBC 8.9 9.3  RBC 4.25 4.43  HGB 12.5* 13.1  HCT 36.9* 38.9*  MCV 86.8 87.8  MCH 29.4 29.6  MCHC 33.9 33.7  RDW 13.8 14.3  PLT 172 190   Cardiac Enzymes Recent Labs  Lab 08/15/17 1722 08/15/17 2143 08/16/17 0020 08/16/17 0344  TROPONINI 0.03* <0.03 <0.03 <0.03   No results for input(s): TROPIPOC in the last 168 hours.  BNPNo results for input(s): BNP, PROBNP in the last 168 hours.  DDimer  Recent Labs  Lab 08/16/17 0020  DDIMER 0.31    Radiology/Studies:  Dg Chest 2 View  Result Date: 08/15/2017 CLINICAL DATA:  Shortness of breath.  Chest tightness. EXAM: CHEST - 2 VIEW COMPARISON:  08/11/2016. FINDINGS: Mediastinum and hilar structures normal. Lungs are clear. No pleural effusion or pneumothorax. Borderline cardiomegaly with normal pulmonary vascularity. IMPRESSION: 1.  Borderline cardiomegaly.  No pulmonary venous congestion. 2.  No acute pulmonary disease. Electronically Signed   By: Maisie Fus  Register   On: 08/15/2017 14:21    Assessment and Plan:   Chest pain -CVD risk factors include HTN, HLD, DM type 2 and tobacco abuse. Also has untreated sleep apnea. -patient developed new onset chest tightness with shortness of breath, no radiation of discomfort, diaphoresis, nausea, lightheadedness. No chest discomfort or shortness of breath through the night. -Troponin initially very mildly elevated at 0.03 followed by <0.03 X 3 -EKG with abnormal R wave progression and TWI inferior and laterally -Borderline cardiomegaly on CXR -this patient  certainly has risk factors for cardiovascular disease. His symptoms may be related to uncontrolled hypertension versus myocardial ischemia. -the patient will need an ischemic evaluation. I will discuss nuclear stress test versus coronary CTA versus cath with Dr. Mayford Knife. The patient is nothing by mouth.  Hypertension -recently untreated. Outpatient therapy amlodipine 5 mg daily, lisinopril 20 mg daily -Blood pressure is elevated. 188/133 on presentation. Home meds initiated. IV Hydralazine as needed. Continue to monitor and adjust therapy as needed. Goal blood pressure less than 130/80. -echocardiogram has been ordered. Will evaluate for hypertension related cardiomyopathies.  Hyperlipidemia -LDL 111. The patient may need treatment pending cardiac evaluation.  Diabetes Type 2 -Has not been taking his metformin.  -Poorly controlled with A1c 8.9 -patient will need to be compliant with diabetes treatment and follow-up  Tobacco abuse -The patient smokes approximately one half to one pack per day -Effects of smoking on the heart have been discussed with the patient. He expresses desire to quit smoking and states that he was given nicotine patches at the urgent care.   For questions or updates, please contact [facility] Please consult [email or web address] for contact info under Cardiology/STEMI.   Signed, Berton Bon, NP  08/16/2017 7:58 AM

## 2017-08-16 NOTE — Care Management Note (Signed)
Case Management Note  Patient Details  Name: Claudie LeachSteve L Staples MRN: [id number] Date of Birth: 06-25-1965  Subjective/Objective:   From home with wife, s/p coronary stent intervention, will be on brilinta, NCM awaiting benefit check.  NCM will give patient 30 day savings coupon and $5 co pay card for brilinta.                  Action/Plan: NCM will follow for transition of care needs.   Expected Discharge Date:                  Expected Discharge Plan:  Home/Self Care  In-House Referral:     Discharge planning Services  CM Consult, Medication Assistance  Post Acute Care Choice:    Choice offered to:     DME Arranged:    DME Agency:     HH Arranged:    HH Agency:     Status of Service:  In process, will continue to follow  If discussed at Long Length of Stay Meetings, dates discussed:    Additional Comments:  Leone Havenaylor, Zanya Lindo Clinton, RN 08/16/2017, 2:51 PM

## 2017-08-17 ENCOUNTER — Inpatient Hospital Stay (HOSPITAL_COMMUNITY): Payer: 59

## 2017-08-17 ENCOUNTER — Telehealth: Payer: Self-pay | Admitting: Cardiology

## 2017-08-17 DIAGNOSIS — I200 Unstable angina: Secondary | ICD-10-CM

## 2017-08-17 DIAGNOSIS — I2583 Coronary atherosclerosis due to lipid rich plaque: Secondary | ICD-10-CM

## 2017-08-17 DIAGNOSIS — R072 Precordial pain: Secondary | ICD-10-CM

## 2017-08-17 DIAGNOSIS — I2510 Atherosclerotic heart disease of native coronary artery without angina pectoris: Secondary | ICD-10-CM

## 2017-08-17 LAB — BASIC METABOLIC PANEL
Anion gap: 9 (ref 5–15)
BUN: 11 mg/dL (ref 6–20)
CO2: 24 mmol/L (ref 22–32)
Calcium: 9.1 mg/dL (ref 8.9–10.3)
Chloride: 104 mmol/L (ref 101–111)
Creatinine, Ser: 1.14 mg/dL (ref 0.61–1.24)
GFR calc Af Amer: >60 mL/min (ref >60)
GFR calc non Af Amer: >60 mL/min (ref >60)
Glucose, Bld: 163 mg/dL — ABNORMAL HIGH (ref 65–99)
Potassium: 3.9 mmol/L (ref 3.5–5.1)
Sodium: 137 mmol/L (ref 135–145)

## 2017-08-17 LAB — CBC
HCT: 39.7 % (ref 39.0–52.0)
Hemoglobin: 13.4 g/dL (ref 13.0–17.0)
MCH: 29.6 pg (ref 26.0–34.0)
MCHC: 33.8 g/dL (ref 30.0–36.0)
MCV: 87.6 fL (ref 78.0–100.0)
Platelets: 194 10*3/uL (ref 150–400)
RBC: 4.53 MIL/uL (ref 4.22–5.81)
RDW: 14.1 % (ref 11.5–15.5)
WBC: 10 10*3/uL (ref 4.0–10.5)

## 2017-08-17 LAB — GLUCOSE, CAPILLARY
Glucose-Capillary: 202 mg/dL — ABNORMAL HIGH (ref 65–99)
Glucose-Capillary: 175 mg/dL — ABNORMAL HIGH (ref 65–99)

## 2017-08-17 LAB — ECHOCARDIOGRAM COMPLETE
Height: 70 in
Weight: 3954.17 oz

## 2017-08-17 MED ORDER — LISINOPRIL 40 MG PO TABS
40.0000 mg | ORAL_TABLET | Freq: Every day | ORAL | Status: DC
  Administered 2017-08-17: 40 mg via ORAL
  Filled 2017-08-17: qty 1

## 2017-08-17 MED ORDER — AMLODIPINE BESYLATE 5 MG PO TABS: 5.0000 mg | ORAL_TABLET | Freq: Every day | ORAL | 30 refills | Status: DC

## 2017-08-17 MED ORDER — LISINOPRIL 40 MG PO TABS: 40.0000 mg | ORAL_TABLET | Freq: Every day | ORAL | 3 refills | Status: DC

## 2017-08-17 MED ORDER — NITROGLYCERIN 0.4 MG SL SUBL: 0.4000 mg | SUBLINGUAL_TABLET | SUBLINGUAL | 12 refills | Status: DC | PRN

## 2017-08-17 MED ORDER — ATORVASTATIN CALCIUM 80 MG PO TABS: 80.0000 mg | ORAL_TABLET | Freq: Every day | ORAL | 3 refills | Status: DC

## 2017-08-17 MED ORDER — TICAGRELOR 90 MG PO TABS: 90.0000 mg | ORAL_TABLET | Freq: Two times a day (BID) | ORAL | 0 refills | Status: DC

## 2017-08-17 MED ORDER — METFORMIN HCL 500 MG PO TABS: 500.0000 mg | ORAL_TABLET | Freq: Two times a day (BID) | ORAL | 11 refills | Status: DC

## 2017-08-17 MED ORDER — ASPIRIN 81 MG PO CHEW: 81.0000 mg | CHEWABLE_TABLET | Freq: Every day | ORAL | 3 refills | Status: AC

## 2017-08-17 MED ORDER — NICOTINE 14 MG/24HR TD PT24: 14.0000 mg | MEDICATED_PATCH | TRANSDERMAL | 0 refills | Status: DC

## 2017-08-17 MED ORDER — ATORVASTATIN CALCIUM 80 MG PO TABS: 80.0000 mg | ORAL_TABLET | Freq: Every day | ORAL | Status: DC

## 2017-08-17 MED ORDER — CARVEDILOL 3.125 MG PO TABS: 6.2500 mg | ORAL_TABLET | Freq: Once | ORAL | Status: AC

## 2017-08-17 MED ORDER — CARVEDILOL 12.5 MG PO TABS: 12.5000 mg | ORAL_TABLET | Freq: Two times a day (BID) | ORAL | Status: DC

## 2017-08-17 MED ORDER — AMLODIPINE BESYLATE 5 MG PO TABS
5.0000 mg | ORAL_TABLET | Freq: Every day | ORAL | Status: DC
  Administered 2017-08-17: 5 mg via ORAL
  Filled 2017-08-17: qty 1

## 2017-08-17 MED ORDER — TICAGRELOR 90 MG PO TABS: 90.0000 mg | ORAL_TABLET | Freq: Two times a day (BID) | ORAL | 11 refills | Status: DC

## 2017-08-17 MED ORDER — CARVEDILOL 12.5 MG PO TABS: 12.5000 mg | ORAL_TABLET | Freq: Two times a day (BID) | ORAL | 3 refills | Status: DC

## 2017-08-17 MED FILL — Heparin Sodium (Porcine) 2 Unit/ML in Sodium Chloride 0.9%: INTRAMUSCULAR | Qty: 1000 | Status: AC

## 2017-08-17 NOTE — Telephone Encounter (Signed)
Patient is still admitted in hospital at this time, will continue to follow to make TCM call to him.

## 2017-08-17 NOTE — Discharge Summary (Signed)
Please refer to note by cardiology on 3/14 by Dr. Mayford Knifeurner. Agree with recommendations and it appears that Cardiology has completed discharge order and further recommendations.  Qwest Communicationsrlando Ilisa Hayworth

## 2017-08-17 NOTE — Progress Notes (Signed)
  Echocardiogram 2D Echocardiogram has been performed.  Clifford Frederick 08/17/2017, 12:27 PM

## 2017-08-17 NOTE — Telephone Encounter (Signed)
New Message    [facility] 3/28 11a

## 2017-08-17 NOTE — Progress Notes (Signed)
Inpatient Diabetes Program Recommendations  AACE/ADA: New Consensus Statement on Inpatient Glycemic Control (2015)  Target Ranges:  Prepandial:   less than 140 mg/dL      Peak postprandial:   less than 180 mg/dL (1-2 hours)      Critically ill patients:  140 - 180 mg/dL   Lab Results  Component Value Date   GLUCAP 202 (H) 08/17/2017   HGBA1C 8.9 (H) 08/16/2017    Review of Glycemic Control  Diabetes history: None Outpatient Diabetes medications: None Current orders for Inpatient glycemic control: Novolog correction sensitive + hs  Inpatient Diabetes Program Recommendations:   Spoke with pt about A1C 8.4 results with them and explained what an A1C is, basic pathophysiology of DM Type 2, basic home care, basic diabetes diet nutrition principles, importance of checking CBGs and maintaining good CBG control to prevent long-term and short-term complications. Reviewed signs and symptoms of hyperglycemia and hypoglycemia and how to treat hypoglycemia at home. Also reviewed blood sugar goals at home.  RNs to provide ongoing basic DM education at bedside with this patient.  Spoke with Dr. [name] regarding diabetes medications for home. Patient is willing to decrease sugar in drinks and food and states understanding of need to take medications as prescribed. @ this point, would recommend Metformin and followup with PCP. Patient has not had a recent PCP. Discharge needs: Meter + strips # [id number]  Thank you, Billy FischerJudy E. Tin Engram, RN, MSN, CDE  Diabetes Coordinator Inpatient Glycemic Control Team Team Pager [phone] (8am-5pm) 08/17/2017 9:43 AM

## 2017-08-17 NOTE — Progress Notes (Signed)
CARDIAC REHAB PHASE I   PRE:  Rate/Rhythm: 103 ST    BP: sitting 150/85    SaO2:   MODE:  Ambulation: 1000 ft   POST:  Rate/Rhythm: 118 ST    BP: sitting 158/97     SaO2:   Tolerated well, no c/o. HR elevated, BP as well. Ed completed with pt, good reception. He is ready to make change. Understands importance of Brilinta and other meds. Plans to quit smoking and thankful for fake cigarette. Resources given. Will refer to [place] [facility] however he will need to work it out with his work.  [id number]   Harriet Massonandi Kristan Deanda Ruddell CES, ACSM 08/17/2017 9:06 AM

## 2017-08-17 NOTE — Care Management Note (Signed)
Case Management Note  Patient Details  Name: Claudie LeachSteve L Bihm MRN: [id number] Date of Birth: 1965/10/10  Subjective/Objective:  From home with wife, s/p coronary stent intervention, will be on brilinta, NCM awaiting benefit check. Patient has  30 day savings coupon and $5 co pay card for brilinta.  NCM informed him of co pay amt of 200.00 but informed patient after  He uses the 30 day free to use the $5 co pay card when he has about a week left to make sure he can use it, if not he will need to contact his Cardiologist , patient also has samples of brilinta to use . NCM gave patient the [facility] information to help assist him in getting a PCP in network with his insurance.                                   Action/Plan: DC home .   Expected Discharge Date:                  Expected Discharge Plan:  Home/Self Care  In-House Referral:     Discharge planning Services  CM Consult, Medication Assistance  Post Acute Care Choice:    Choice offered to:     DME Arranged:    DME Agency:     HH Arranged:    HH Agency:     Status of Service:  Completed, signed off  If discussed at MicrosoftLong Length of Stay Meetings, dates discussed:    Additional Comments:  Leone Havenaylor, Neasia Fleeman Clinton, RN 08/17/2017, 9:49 AM

## 2017-08-17 NOTE — Research (Signed)
CADFEM Informed Consent   Subject Name: Clifford Frederick  Subject met inclusion and exclusion criteria.  The informed consent form, study requirements and expectations were reviewed with the subject and questions and concerns were addressed prior to the signing of the consent form.  The subject verbalized understanding of the trail requirements.  The subject agreed to participate in the CADFEM trial and signed the informed consent.  The informed consent was obtained prior to performance of any protocol-specific procedures for the subject.  A copy of the signed informed consent was given to the subject and a copy was placed in the subject's medical record.  Neva Seat 08/17/2017, 10:37 AM

## 2017-08-17 NOTE — Discharge Summary (Addendum)
Discharge Summary    Patient ID: Clifford Frederick,  MRN: [id number], DOB/AGE: [birth year] 52 y.o.  Admit date: 08/15/2017 Discharge date: 08/17/2017  Primary Care Provider: Patient, No Pcp Per Primary Cardiologist: Armanda Magic, MD  Discharge Diagnoses    Principal Problem:   Chest pain Active Problems:   Tobacco abuse   Hypertensive urgency   Diabetes mellitus type 2 in obese (HCC)   Normocytic anemia   Pure hypercholesterolemia   Abnormal EKG   Unstable angina (HCC)   Allergies No Known Allergies  Diagnostic Studies/Procedures    CORONARY STENT INTERVENTION  LEFT HEART CATH AND CORONARY ANGIOGRAPHY 08/16/17  Conclusion    Dist RCA lesion is 80% stenosed.  Prox Cx to Mid Cx lesion is 25% stenosed.  There is mild to moderate left ventricular systolic dysfunction.  There is no aortic valve stenosis.  A drug-eluting stent was successfully placed using a STENT SYNERGY DES 3X16.  Post intervention, there is a 0% residual stenosis.   Continue dual antiplatelet therapy with aspirin and Brilinta for 1 year along with aggressive secondary prevention.    I stressed the importance of compliance with his DAPT, and discussed the risks of noncompliance.      Diagnostic Diagram       Post-Intervention Diagram         Echocardiogram 08/17/17 Study Conclusions  - Left ventricle: The cavity size was normal. There was moderate   concentric hypertrophy. Systolic function was normal. The   estimated ejection fraction was in the range of 60% to 65%. There   is hypokinesis of the apicalinferolateral myocardium. Features   are consistent with a pseudonormal left ventricular filling   pattern, with concomitant abnormal relaxation and increased   filling pressure (grade 2 diastolic dysfunction). Doppler   parameters are consistent with high ventricular filling pressure. - Aortic valve: Trileaflet; mildly thickened, mildly calcified   leaflets. - Mitral valve:  Calcified annulus. There was trivial regurgitation. - Left atrium: The atrium was mildly dilated. - Pulmonary arteries: Systolic pressure could not be accurately   estimated. _____________   History of Present Illness     Clifford Frederick is a 52 y.o. male with a hx of HTN, HLD, DM type 2, sleep apnea and tobacco abuse who presented for evaluation of chest pain. Clifford Frederick has not seen a PCP in at least a year and a half and not taken any of his meds in a year or so. He smokes about 1/2-1 pack of cigarettes per day. Clifford Frederick has a history of sleep apnea for which CPAP was recommended but he did not get it at the time as his insurance wouldn't pay. Clifford Frederick denies any previous heart attack, stroke or cardiac testing. He does not have a cardiologist.  On 08/15/17 while at work around 1 PM he went to smoke and developed shortness of breath. He went outside to walk around and developed chest tightness, nonradiating. He did not have any nausea, diaphoresis, palpitations. His boss brought him back inside to sit him down at which time his chest tightness resolved. His boss did check his blood pressure which was reportedly very high. The patient then reported to the hospital. He has had no recurrences of the chest tightness and his shortness of breath had resolved when he got to the emergency room. This is his first episode of chest tightness. Clifford Frederick denies any orthopnea, PND or edema.  He has multiple cardiac risk factors including hypertension poorly controlled,  hyperlipidemia, ongoing tobacco abuse, diabetes mellitus, unknown family history of cardiac disease but his father died when he was 4 of unknown cause.   Hospital Course     Consultants: None  EKG has been abnormal for some time and likely represents repolarization changes from LVH. Troponin initially very mildly elevated at 0.03 followed by <0.03 X 3. His LDL is mildly elevated at 161111 triglycerides 284.  Hemoglobin A1c is elevated at 8.9. His  amlodipine was stopped and carvedilol added as he was mildly tachycardic. His BP continued to be elevated so the carvedilol and lisinopril were increased and his amlodipine will be resumed.   With his significant risk factors and symptoms the patient was taken to the cath lab on 08/16/17 and found to have an 80% stenosed distal RCA, Prox Cx to Mid Cx lesion is 25% stenosed. He is status post drug-eluting Synergy stent to the RCA. There is mild to moderate left ventricular systolic dysfunction on cath. Echocardiogram showed normal LV systolic function with EF 60-65% and hypokinesis of the inferior-lateral wall.   He will continue DAPT for 1 year. He has been stared on aspirin 81 mg and Brilinta 90 mg twice daily. His copay for Brillinta will be $200. He was given a coupon for free 30 days and a $5 copay card to use after that. If he cannot get Brillinta for $5, he will call our office and can be switched to Plavix. The case manager has arranged this and has provided some samples of Brilinta.   The patient needs aggressive secondary prevention. He has been counseled on smoking cessation. Continue high-dose statin and carvedilol. He was seen by the DM coordinator and recommended to take metformin and find a PCP for follow up. NCM gave patient the [facility] information to help assist him in getting a PCP in network with his insurance.       Patient has been seen by Dr. Mayford Knifeurner today and deemed ready for discharge home. All follow up appointments have been scheduled. Discharge medications are listed below.  _____________  Discharge Vitals Blood pressure (!) 189/90, pulse 96, temperature (!) 97.5 F (36.4 C), temperature source Oral, resp. rate 13, height 5\' 10"  (1.778 m), weight 247 lb 2.2 oz (112.1 kg), SpO2 99 %.  Filed Weights   08/15/17 1404 08/15/17 2100 08/17/17 0610  Weight: 250 lb (113.4 kg) 250 lb 11.2 oz (113.7 kg) 247 lb 2.2 oz (112.1 kg)    Labs & Radiologic Studies    CBC Recent  Labs    08/15/17 1722 08/16/17 0344 08/17/17 0214  WBC 8.9 9.3 10.0  NEUTROABS 5.1 5.9  --   HGB 12.5* 13.1 13.4  HCT 36.9* 38.9* 39.7  MCV 86.8 87.8 87.6  PLT 172 190 194   Basic Metabolic Panel Recent Labs    09/60/4503/13/19 0344 08/17/17 0214  NA 136 137  K 3.6 3.9  CL 104 104  CO2 22 24  GLUCOSE 192* 163*  BUN 9 11  CREATININE 1.00 1.14  CALCIUM 8.9 9.1   Liver Function Tests No results for input(s): AST, ALT, ALKPHOS, BILITOT, PROT, ALBUMIN in the last 72 hours. No results for input(s): LIPASE, AMYLASE in the last 72 hours. Cardiac Enzymes Recent Labs    08/15/17 2143 08/16/17 0020 08/16/17 0344  TROPONINI <0.03 <0.03 <0.03   BNP Invalid input(s): POCBNP D-Dimer Recent Labs    08/16/17 0020  DDIMER 0.31   Hemoglobin A1C Recent Labs    08/16/17 0344  HGBA1C 8.9*  Fasting Lipid Panel Recent Labs    08/16/17 0344  CHOL 205*  HDL 37*  LDLCALC 111*  TRIG 284*  CHOLHDL 5.5   Thyroid Function Tests No results for input(s): TSH, T4TOTAL, T3FREE, THYROIDAB in the last 72 hours.  Invalid input(s): FREET3 _____________  Dg Chest 2 View  Result Date: 08/15/2017 CLINICAL DATA:  Shortness of breath.  Chest tightness. EXAM: CHEST - 2 VIEW COMPARISON:  08/11/2016. FINDINGS: Mediastinum and hilar structures normal. Lungs are clear. No pleural effusion or pneumothorax. Borderline cardiomegaly with normal pulmonary vascularity. IMPRESSION: 1.  Borderline cardiomegaly.  No pulmonary venous congestion. 2.  No acute pulmonary disease. Electronically Signed   By: Maisie Fus  Register   On: 08/15/2017 14:21   Disposition   Pt is being discharged home today in good condition.  Follow-up Plans & Appointments    Follow-up Information    Berton Bon, NP Follow up.   Specialty:  Nurse Practitioner Why:  Cardiology hospital follow up on March 28th at 11:00. Please arrive 15 minutes early to check in.  Contact information: [address] [place]  [address] [phone]        primary care provider. Schedule an appointment as soon as possible for a visit.   Why:  Please follow up with a primary care provider as soon as possible for follow up of diabetes and general health.          Discharge Instructions    Amb Referral to Cardiac Rehabilitation   Complete by:  As directed    Diagnosis:   Coronary Stents PTCA     Diet - low sodium heart healthy   Complete by:  As directed    Discharge instructions   Complete by:  As directed    No driving for 1 week. No lifting over 5 lbs for 1 week. No sexual activity for 1 week. You may return to work on 3/21. Keep procedure site clean & dry. If you notice increased pain, swelling, bleeding or pus, call/return!  You may shower, but no soaking baths/hot tubs/pools for 1 week.   Please have Brilinta refilled 1 week before you run out. Use Copay card. If the copay discount card does not work please call the office to have Brilinta switched to Plavix. DO NOT GO WITHOUT THIS MEDICATION!  Do not start metformin until 3/16.   Increase activity slowly   Complete by:  As directed       Discharge Medications   Allergies as of 08/17/2017   No Known Allergies     Medication List    TAKE these medications   amLODipine 5 MG tablet Commonly known as:  NORVASC Take 1 tablet (5 mg total) by mouth daily.   aspirin 81 MG chewable tablet Chew 1 tablet (81 mg total) by mouth daily. Start taking on:  08/18/2017   atorvastatin 80 MG tablet Commonly known as:  LIPITOR Take 1 tablet (80 mg total) by mouth daily at 6 PM.   carvedilol 12.5 MG tablet Commonly known as:  COREG Take 1 tablet (12.5 mg total) by mouth 2 (two) times daily with a meal.   ibuprofen 200 MG tablet Commonly known as:  ADVIL,MOTRIN Take 400 mg by mouth every 6 (six) hours as needed for mild pain.   lisinopril 40 MG tablet Commonly known as:  PRINIVIL,ZESTRIL Take 1 tablet (40 mg total) by mouth daily. Start taking on:   08/18/2017 What changed:    medication strength  how much to  take   metFORMIN 500 MG tablet Commonly known as:  GLUCOPHAGE Take 1 tablet (500 mg total) by mouth 2 (two) times daily with a meal.   nicotine 14 mg/24hr patch Commonly known as:  NICODERM CQ - dosed in mg/24 hours Place 1 patch (14 mg total) onto the skin daily for 21 days.   nitroGLYCERIN 0.4 MG SL tablet Commonly known as:  NITROSTAT Place 1 tablet (0.4 mg total) under the tongue every 5 (five) minutes as needed for chest pain.   ticagrelor 90 MG Tabs tablet Commonly known as:  BRILINTA Take 1 tablet (90 mg total) by mouth 2 (two) times daily.        Aspirin prescribed at discharge?  Yes High Intensity Statin Prescribed? (Lipitor 40-80mg  or Crestor 20-40mg ): Yes Beta Blocker Prescribed? Yes For EF <40%, was ACEI/ARB Prescribed? No: normal EF ADP Receptor Inhibitor Prescribed? (i.e. Plavix etc.-Includes Medically Managed Patients): Yes For EF <40%, Aldosterone Inhibitor Prescribed? No: normal EF.  Was EF assessed during THIS hospitalization? Yes Was Cardiac Rehab II ordered? (Included Medically managed Patients): Yes   Outstanding Labs/Studies   Will need FLP and ALT in 6 Frederick.   Duration of Discharge Encounter   Greater than 30 minutes including physician time.  Signed, Berton Bon NP 08/17/2017, 3:39 PM

## 2017-08-17 NOTE — Progress Notes (Addendum)
Progress Note  Patient Name: Clifford Frederick Date of Encounter: 08/17/2017  Primary Cardiologist: Armanda Magic, MD   Subjective   Patient up and walking the halls without any difficulty.  He denies any chest pain.  Inpatient Medications    Scheduled Meds: . aspirin  81 mg Oral Daily  . carvedilol  6.25 mg Oral BID WC  . enoxaparin (LOVENOX) injection  40 mg Subcutaneous Q24H  . insulin aspart  0-5 Units Subcutaneous QHS  . insulin aspart  0-9 Units Subcutaneous TID WC  . lisinopril  20 mg Oral Daily  . sodium chloride flush  3 mL Intravenous Q12H  . ticagrelor  90 mg Oral BID   Continuous Infusions: . sodium chloride     PRN Meds: sodium chloride, acetaminophen, acetaminophen, gi cocktail, hydrALAZINE, ipratropium-albuterol, morphine injection, nitroGLYCERIN, ondansetron (ZOFRAN) IV, ondansetron (ZOFRAN) IV, sodium chloride flush   Vital Signs    Vitals:   08/17/17 0043 08/17/17 0610 08/17/17 0629 08/17/17 0654  BP: (!) 146/96 (!) 174/102 (!) 164/111 (!) 165/92  Pulse:  90  96  Resp: 16 11 18 16   Temp:  98.5 F (36.9 C)  98.2 F (36.8 C)  TempSrc:  Oral  Oral  SpO2:  99%  99%  Weight:  247 lb 2.2 oz (112.1 kg)    Height:        Intake/Output Summary (Last 24 hours) at 08/17/2017 0826 Last data filed at 08/17/2017 0755 Gross per 24 hour  Intake 915 ml  Output 1900 ml  Net -985 ml   Filed Weights   08/15/17 1404 08/15/17 2100 08/17/17 0610  Weight: 250 lb (113.4 kg) 250 lb 11.2 oz (113.7 kg) 247 lb 2.2 oz (112.1 kg)    Telemetry    Normal sinus rhythm- Personally Reviewed  ECG    Normal sinus rhythm with LVH and repolarization changes with inferior lateral T wave abnormality- Personally Reviewed  Physical Exam   GEN: No acute distress.   Neck: No JVD Cardiac: RRR, no murmurs, rubs, or gallops.  Respiratory: Clear to auscultation bilaterally. GI: Soft, nontender, non-distended  MS: No edema; No deformity.  Right radial artery site clean dry and  intact with no hematoma 2+ radial pulse Neuro:  Nonfocal  Psych: Normal affect   Labs    Chemistry Recent Labs  Lab 08/15/17 1722 08/16/17 0344 08/17/17 0214  NA 136 136 137  K 3.9 3.6 3.9  CL 103 104 104  CO2 25 22 24   GLUCOSE 213* 192* 163*  BUN 15 9 11   CREATININE 1.18 1.00 1.14  CALCIUM 8.9 8.9 9.1  GFRNONAA >60 >60 >60  GFRAA >60 >60 >60  ANIONGAP 8 10 9      Hematology Recent Labs  Lab 08/15/17 1722 08/16/17 0344 08/17/17 0214  WBC 8.9 9.3 10.0  RBC 4.25 4.43 4.53  HGB 12.5* 13.1 13.4  HCT 36.9* 38.9* 39.7  MCV 86.8 87.8 87.6  MCH 29.4 29.6 29.6  MCHC 33.9 33.7 33.8  RDW 13.8 14.3 14.1  PLT 172 190 194    Cardiac Enzymes Recent Labs  Lab 08/15/17 1722 08/15/17 2143 08/16/17 0020 08/16/17 0344  TROPONINI 0.03* <0.03 <0.03 <0.03   No results for input(s): TROPIPOC in the last 168 hours.   BNPNo results for input(s): BNP, PROBNP in the last 168 hours.   DDimer  Recent Labs  Lab 08/16/17 0020  DDIMER 0.31     Radiology    Dg Chest 2 View  Result Date: 08/15/2017 CLINICAL DATA:  Shortness of breath.  Chest tightness. EXAM: CHEST - 2 VIEW COMPARISON:  08/11/2016. FINDINGS: Mediastinum and hilar structures normal. Lungs are clear. No pleural effusion or pneumothorax. Borderline cardiomegaly with normal pulmonary vascularity. IMPRESSION: 1.  Borderline cardiomegaly.  No pulmonary venous congestion. 2.  No acute pulmonary disease. Electronically Signed   By: Maisie Fushomas  Register   On: 08/15/2017 14:21    Cardiac Studies   2D echo is pending  Cardiac cath 08/17/2017 Conclusion     Dist RCA lesion is 80% stenosed.  Prox Cx to Mid Cx lesion is 25% stenosed.  There is mild to moderate left ventricular systolic dysfunction.  There is no aortic valve stenosis.  A drug-eluting stent was successfully placed using a STENT SYNERGY DES 3X16.  Post intervention, there is a 0% residual stenosis.   Continue dual antiplatelet therapy with aspirin and  Brilinta for 1 year along with aggressive secondary prevention.    I stressed the importance of compliance with his DAPT, and discussed the risks of noncompliance    Patient Profile     52 y.o. male with a hx of HTN, HLD, DM type 2, sleep apnea and tobacco abuse who is being seen today for the evaluation of chest pain   Assessment & Plan    Chest pain/ASCAD -CVD risk factors include HTN, HLD, DM type 2 and tobacco abuse. Also has untreated sleep apnea. -patient developed new onset chest tightness with shortness of breath, no radiation of discomfort, diaphoresis, nausea, lightheadedness. No chest discomfort or shortness of breath through the night. -Troponin initially very mildly elevated at 0.03 followed by <0.03 X 3 -EKG with abnormal R wave progression and TWI inferior and laterally -likely related to hypertension with LVH and repolarization of normality -2D echocardiogram pending to assess LV function and LVH -Cath showed 80% distal RCA and 25% mid to proximal left circumflex.  He is status post drug-eluting Synergy stent to the RCA. -He will continue DA PT with aspirin 81 mg and Brilinta 90 mg twice daily for 1 year. -Needs aggressive secondary prevention -Continue high-dose statin and carvedilol  Hypertension -recently untreated. Outpatient therapy amlodipine 5 mg daily, lisinopril 20 mg daily -Blood pressure remains elevated at 165/92 mmHg this morning -Will not restart amlodipine and will focus on titrating beta-blocker and ACE inhibitor given his LV dysfunction -We will increase carvedilol to 12.5 mg twice daily and increase lisinopril to 40 mg daily  Hyperlipidemia -LDL 111.  -He is now on high-dose statin therapy with atorvastatin 80 mg daily -We will need an FLP and ALT in our office in 6 weeks  Diabetes Type 2 -Has not been taking his metformin.  -Poorly controlled with A1c 8.9 -patient will need to be compliant with diabetes treatment and follow-up -Blood sugars  mildly elevated at 202 today. -Hemoglobin A1c 8.9 -We will get him into his PCP for further diabetic management -I will asked the diabetic coordinator to come by to make recommendations for discharge -likely will place back on metformin and in the 48 hours  Tobacco abuse -The patient smokes approximately one half to one pack per day -Effects of smoking on the heart have been discussed with the patient. He expresses desire to quit smoking and states that he was given nicotine patches at the urgent care.  Dilated cardiomyopathy -Mild to moderate LV dysfunction of cath and 2D echocardiogram is pending -Likely mixed pattern from hypertension and coronary disease -He appears euvolemic on exam today -He put out 1900 cc yesterday and is net -  2.2 L  -weight is down 3 pounds -Continue beta-blocker and ACE inhibitor -Creatinine stable post cath   - Monitor blood pressure this morning after he gets his higher doses of ACE inhibitor and beta-blocker if blood pressure levels out and echo ok then he is stable for discharge home later today after he is seen by the diabetic coordinator  For questions or updates, please contact [facility] Please consult [email or web address] for contact info under Cardiology/STEMI.      Signed, Armanda Magic, MD  08/17/2017, 8:26 AM

## 2017-08-18 ENCOUNTER — Encounter: Payer: Self-pay | Admitting: Cardiology

## 2017-08-18 NOTE — Telephone Encounter (Signed)
No answer and no way to leave a message as the pts VM has not been set up yet. Will call back on Monday.

## 2017-08-21 NOTE — Telephone Encounter (Signed)
No answer and no way to leave a message

## 2017-08-23 ENCOUNTER — Telehealth (HOSPITAL_COMMUNITY): Payer: Self-pay

## 2017-08-23 ENCOUNTER — Telehealth: Payer: Self-pay | Admitting: Physician Assistant

## 2017-08-23 NOTE — Telephone Encounter (Signed)
Patients insurance is active and benefits verified through [facility] - No co-pay, deductible amount of $1,000/$1,000 has been met, out of pocket amount of $5,000/$1,148.13 has been met, 20% co-insurance, and no pre-authorization is required. Passport/reference [phone]  Patient will be contacted for scheduling upon review by the RN Navigator.

## 2017-08-23 NOTE — Telephone Encounter (Signed)
Pt called stating that today he noticed new bruising near his radial artery access site. He reports being able to feel a pulse in his wrist. He denies numbness, tingling, and pain in that extremity. He has full ROM. I told him if it becomes painful or if he can't feel a pulse, he should go to the ER. It sounds as though there are no sensory, motor, or perfusion deficits in his right upper extremity. I advised him that given his findings, per the patient, he could wait and call the office tomorrow for a same-day appt. He expressed understanding of this plan.   Roe Rutherfordngela Nicole Duke, PA-C 08/23/2017, 7:47 PM [phone] [facility]

## 2017-08-29 ENCOUNTER — Telehealth (HOSPITAL_COMMUNITY): Payer: Self-pay

## 2017-08-29 ENCOUNTER — Encounter (HOSPITAL_COMMUNITY): Payer: Self-pay

## 2017-08-29 NOTE — Telephone Encounter (Signed)
Attempted to call patient in regards to Cardiac Rehab - vm has not been set up yet. Sending letter.

## 2017-08-31 ENCOUNTER — Ambulatory Visit (INDEPENDENT_AMBULATORY_CARE_PROVIDER_SITE_OTHER): Payer: 59 | Admitting: Cardiology

## 2017-08-31 ENCOUNTER — Encounter: Payer: Self-pay | Admitting: Cardiology

## 2017-08-31 ENCOUNTER — Encounter: Payer: Self-pay | Admitting: Nurse Practitioner

## 2017-08-31 VITALS — BP 140/82 | HR 87 | Ht 70.0 in | Wt 246.8 lb

## 2017-08-31 DIAGNOSIS — G4733 Obstructive sleep apnea (adult) (pediatric): Secondary | ICD-10-CM | POA: Diagnosis not present

## 2017-08-31 DIAGNOSIS — Z720 Tobacco use: Secondary | ICD-10-CM

## 2017-08-31 DIAGNOSIS — E669 Obesity, unspecified: Secondary | ICD-10-CM

## 2017-08-31 DIAGNOSIS — E7800 Pure hypercholesterolemia, unspecified: Secondary | ICD-10-CM

## 2017-08-31 DIAGNOSIS — I10 Essential (primary) hypertension: Secondary | ICD-10-CM | POA: Diagnosis not present

## 2017-08-31 DIAGNOSIS — I2510 Atherosclerotic heart disease of native coronary artery without angina pectoris: Secondary | ICD-10-CM

## 2017-08-31 DIAGNOSIS — E1169 Type 2 diabetes mellitus with other specified complication: Secondary | ICD-10-CM | POA: Diagnosis not present

## 2017-08-31 MED ORDER — CARVEDILOL 25 MG PO TABS: 25.0000 mg | ORAL_TABLET | Freq: Two times a day (BID) | ORAL | 3 refills | Status: DC

## 2017-08-31 NOTE — Progress Notes (Signed)
Cardiology Office Note:    Date:  08/31/2017   ID:  Clifford Frederick, DOB [birth year], MRN [id number]  PCP:  Patient, No Pcp Per  Cardiologist:  Armanda Magic, MD  Referring MD: No ref. provider found   Chief Complaint  Patient presents with  . Hospitalization Follow-up    Post PCI/Stent    History of Present Illness:    Clifford Frederick is a 52 y.o. male with a past medical history significant for HTN, HLD, DM type 2, sleep apnea and tobacco abusewho presented for evaluation of chest pain. CliffordDavishas not seen a PCP inat least a year and a halfand not taken any of his meds in a year or so. He smokes about 1/2-1 pack ofcigarettes per day. Clifford Frederick has a history of sleep apnea for which CPAP was recommended but he did not get it at the time as his insurance wouldn't pay. Clifford Frederick had no previous heart attack, stroke or cardiac testing.  He was admitted to the hospital 3/12-3/14/2019 for evaluation of chest pain and shortness of breath while he was at work.  His EKG had been abnormal for some time and likely represented with repolarization changes from LVH.  Troponins were insignificant.  He was started on carvedilol given mild tachycardia and lisinopril was added and his prior amlodipine was resumed for elevated blood pressure.  With his significant risk factors and symptoms the patient was taken to the Cath Lab on 08/16/17 and found to have an 80% stenosed distal RCA, proximal circumflex to mid circumflex lesion of 25% stenosis.  He received a drug-eluting Synergy stent to the RCA.  There was mild to moderate left ventricular systolic dysfunction on cath.  Echocardiogram showed normal LV systolic function with EF 60-65% and hypokinesis of the inferior lateral wall.   He was placed on dual antiplatelet therapy for 1 year.  He was started on aspirin 81 mg and Brilinta 90 mg twice daily.  His co-pay for Brilinta was projected to be $200.  He was given a coupon for free 30 days and a $5 co-pay card  to use after that.  It was noted that if the $5 co-pay card did not work we would switch him to Plavix.  He was also started on high intensity, high-dose statin.  He was seen by the diabetes coordinator and placed on metformin to follow-up with his PCP.  He was given information on health connect to find a PCP in network with his insurance.  The patient is here today with his wife for TOC follow-up.  He denies chest discomfort or dyspnea. No orthopnea, PND, palpitations, dizziness or syncope. Right radial cath site healed. There is some residual bruising of his forearm. He reports that he is taking all of his medications as directed and tolerating them well. He is walking for 30 minutes- an hour 3 times per day without any exertional symptoms. He is not yet back to work.   He has quit smoking. He says that he has now cut out sodas and drinking only water.  He does not use salt on his food and he is trying to make heart healthy choices including fruits and vegetables and whole grains.   Past Medical History:  Diagnosis Date  . CAD in native artery    A. LHC 08/16/17- DES to RCA  . Diabetes mellitus without complication (HCC)    but doesn't take his meds and hasn't in months  . High cholesterol   . Hypertension   .  Sleep apnea     Past Surgical History:  Procedure Laterality Date  . CORONARY STENT INTERVENTION N/A 08/16/2017   Procedure: CORONARY STENT INTERVENTION;  Surgeon: Corky Crafts, MD;  Location: [facility] INVASIVE CV LAB;  Service: Cardiovascular;  Laterality: N/A;  . LEFT HEART CATH AND CORONARY ANGIOGRAPHY N/A 08/16/2017   Procedure: LEFT HEART CATH AND CORONARY ANGIOGRAPHY;  Surgeon: Corky Crafts, MD;  Location: [facility] INVASIVE CV LAB;  Service: Cardiovascular;  Laterality: N/A;  . ORIF ANKLE FRACTURE Left 08/07/2012   Procedure: OPEN REDUCTION INTERNAL FIXATION (ORIF) LEFT ANKLE FRACTURE;  Surgeon: Kathryne Hitch, MD;  Location: [facility] OR;  Service: Orthopedics;  Laterality:  Left;    Current Medications: Current Meds  Medication Sig  . amLODipine (NORVASC) 5 MG tablet Take 1 tablet (5 mg total) by mouth daily.  Marland Kitchen aspirin 81 MG chewable tablet Chew 1 tablet (81 mg total) by mouth daily.  Marland Kitchen atorvastatin (LIPITOR) 80 MG tablet Take 1 tablet (80 mg total) by mouth daily at 6 PM.  . lisinopril (PRINIVIL,ZESTRIL) 40 MG tablet Take 1 tablet (40 mg total) by mouth daily.  . metFORMIN (GLUCOPHAGE) 500 MG tablet Take 1 tablet (500 mg total) by mouth 2 (two) times daily with a meal.  . nitroGLYCERIN (NITROSTAT) 0.4 MG SL tablet Place 1 tablet (0.4 mg total) under the tongue every 5 (five) minutes as needed for chest pain.  . ticagrelor (BRILINTA) 90 MG TABS tablet Take 1 tablet (90 mg total) by mouth 2 (two) times daily.  . [DISCONTINUED] carvedilol (COREG) 12.5 MG tablet Take 1 tablet (12.5 mg total) by mouth 2 (two) times daily with a meal.     Allergies:   Patient has no known allergies.   Social History   Socioeconomic History  . Marital status: Married    Spouse name: Not on file  . Number of children: Not on file  . Years of education: Not on file  . Highest education level: Not on file  Occupational History  . Not on file  Social Needs  . Financial resource strain: Not on file  . Food insecurity:    Worry: Not on file    Inability: Not on file  . Transportation needs:    Medical: Not on file    Non-medical: Not on file  Tobacco Use  . Smoking status: Former Smoker    Packs/day: 1.00    Types: Cigarettes    Start date: 08/16/2017  . Smokeless tobacco: Never Used  Substance and Sexual Activity  . Alcohol use: Yes    Comment: occ  . Drug use: No  . Sexual activity: Not on file  Lifestyle  . Physical activity:    Days per week: Not on file    Minutes per session: Not on file  . Stress: Not on file  Relationships  . Social connections:    Talks on phone: Not on file    Gets together: Not on file    Attends religious service: Not on file     Active member of club or organization: Not on file    Attends meetings of clubs or organizations: Not on file    Relationship status: Not on file  Other Topics Concern  . Not on file  Social History Narrative  . Not on file     Family History: The patient's family history includes Diabetes in his brother and mother; Hypertension in his brother and mother. ROS:   Please see the history of present illness.  All other systems reviewed and are negative.  EKGs/Labs/Other Studies Reviewed:    The following studies were reviewed today:  CORONARY STENT INTERVENTION  LEFT HEART CATH AND CORONARY ANGIOGRAPHY 08/16/17  Conclusion    Dist RCA lesion is 80% stenosed.  Prox Cx to Mid Cx lesion is 25% stenosed.  There is mild to moderate left ventricular systolic dysfunction.  There is no aortic valve stenosis.  A drug-eluting stent was successfully placed using a STENT SYNERGY DES 3X16.  Post intervention, there is a 0% residual stenosis.  Continue dual antiplatelet therapy with aspirin and Brilinta for 1 year along with aggressive secondary prevention.   I stressed the importance of compliance with his DAPT, and discussed the risks of noncompliance.     Diagnostic Diagram       Post-Intervention Diagram         Echocardiogram 08/17/17 Study Conclusions  - Left ventricle: The cavity size was normal. There was moderate concentric hypertrophy. Systolic function was normal. The estimated ejection fraction was in the range of 60% to 65%. There is hypokinesis of the apicalinferolateral myocardium. Features are consistent with a pseudonormal left ventricular filling pattern, with concomitant abnormal relaxation and increased filling pressure (grade 2 diastolic dysfunction). Doppler parameters are consistent with high ventricular filling pressure. - Aortic valve: Trileaflet; mildly thickened, mildly calcified leaflets. - Mitral valve: Calcified  annulus. There was trivial regurgitation. - Left atrium: The atrium was mildly dilated. - Pulmonary arteries: Systolic pressure could not be accurately estimated. _____________   EKG:  EKG is not ordered today.   Recent Labs: 08/17/2017: BUN 11; Creatinine, Ser 1.14; Hemoglobin 13.4; Platelets 194; Potassium 3.9; Sodium 137   Recent Lipid Panel    Component Value Date/Time   CHOL 205 (H) 08/16/2017 0344   TRIG 284 (H) 08/16/2017 0344   HDL 37 (L) 08/16/2017 0344   CHOLHDL 5.5 08/16/2017 0344   VLDL 57 (H) 08/16/2017 0344   LDLCALC 111 (H) 08/16/2017 0344    Physical Exam:    VS:  BP 140/82   Pulse 87   Ht 5\' 10"  (1.778 m)   Wt 246 lb 12.8 oz (111.9 kg)   SpO2 97%   BMI 35.41 kg/m     Wt Readings from Last 3 Encounters:  08/31/17 246 lb 12.8 oz (111.9 kg)  08/17/17 247 lb 2.2 oz (112.1 kg)  08/11/16 250 lb (113.4 kg)     Physical Exam  Constitutional: He is oriented to person, place, and time. He appears well-developed and well-nourished. No distress.  HENT:  Head: Normocephalic and atraumatic.  Neck: Normal range of motion. Neck supple. No JVD present.  Cardiovascular: Normal rate, regular rhythm, normal heart sounds and intact distal pulses. Exam reveals no gallop and no friction rub.  No murmur heard. Pulmonary/Chest: Effort normal and breath sounds normal. No respiratory distress. He has no wheezes. He has no rales.  Abdominal: Soft. Bowel sounds are normal.  Musculoskeletal: Normal range of motion. He exhibits no edema.  Neurological: He is alert and oriented to person, place, and time.  Skin: Skin is warm and dry.  Psychiatric: He has a normal mood and affect. His behavior is normal. Thought content normal.     ASSESSMENT:    1. OSA (obstructive sleep apnea)   2. Coronary artery disease involving native coronary artery of native heart without angina pectoris    PLAN:    In order of problems listed above:  CAD: Status post DES to RCA 08/16/17. Ruled  out for  MI. The systolic function was normal by echo. He is doing very well, walking for at least 30 minutes 3 times per day without any exertional symptoms.  He is working on changing his diet and has quit smoking.  Patient is treated with dual antiplatelet therapy, carvedilol, high intensity statin, ACE inhibitor and tolerating all of his medications well.  The patient is taking his Brilinta, the first 64-month.  He will try to use the $5 co-pay card after that.  He also has some samples so that he can attempt to use the co-pay card and if it does not work he will call the office and will switch him to Plavix.  His blood pressure is borderline and his heart rate is in the upper 80s, so will increase carvedilol.  He can return back to work with no restrictions.  Hypertension: Patient had been off of his meds for over a year.  Carvedilol, amlodipine, lisinopril were initiated in the hospital in March.  Blood pressure is borderline elevated.  We will continue his current medications except increase carvedilol to 25 mg twice daily.  Hyperlipidemia:  On 08/16/17 LDL was 111. Atorvastatin 80 mg daily was initiated.  Plan repeat lipid panel and LFTs in May.  Diabetes: Hemoglobin A1c on 08/16/17 was 8.9.  The patient had not been treated in over a year.  Metformin was initiated and the patient was referred to find a PCP- is working on this.  He has cut out all sodas and is trying to limit concentrated sweets.  This along with the exercise that he is doing should really help.  Tobacco abuse: Half-1 pack/day prior to his hospitalization. Has now quit, none since hospital.  Praised him for this and strongly encouraged continued abstinence.  Obstructive sleep apnea: CPAP was recommended in the past but his insurance would not pay for CPAP.  The patient wishes to pursue another sleep study so that he can try to get CPAP again.  I will order this and he will check on the out-of-pocket cost for him and decide whether he  will proceed.   Medication Adjustments/Labs and Tests Ordered: Current medicines are reviewed at length with the patient today.  Concerns regarding medicines are outlined above. Labs and tests ordered and medication changes are outlined in the patient instructions below:  Patient Instructions  Medication Instructions:  Your physician has recommended you make the following change in your medication:   INCREASE Carvedilol (Coreg) to 25 mg twice daily   Labwork: Your physician recommends that you return for lab work in: May   You will need to FAST for this appointment - nothing to eat or drink after midnight the night before except water.   Testing/Procedures: Your physician has recommended that you have a sleep study. This test records several body functions during sleep, including: brain activity, eye movement, oxygen and carbon dioxide blood levels, heart rate and rhythm, breathing rate and rhythm, the flow of air through your mouth and nose, snoring, body muscle movements, and chest and belly movement.   Follow-Up: Your physician recommends that you schedule a follow-up appointment in: 3-4 months with Dr. Mayford Knife   Exercise Guidelines During Cardiac Rehabilitation When you are recovering from a heart condition, such as from heart surgery, heart attack, or heart failure, it is important to have heart-healthy habits, including exercise routines. Discuss an appropriate exercise program with your heart specialist (cardiologist) and rehabilitation therapist. It is important to design a program that is safe and effective for you.  The program should meet your specific abilities and needs. Walking, biking, jogging, and swimming are all good aerobic activities. These take light to moderate effort. Adding some light resistance training is also important. Even simple lifestyle changes can help. These lifestyle changes may include parking farther from the store or taking the stairs instead of the  elevator. At first, you may begin exercising under supervision, such as at a hospital or clinic. Over time, you may begin exercising at home, with your health care provider's approval. Types of exercise Aerobic exercise During cardiac rehabilitation, it is important to do aerobic activities. Aerobic exercise keeps joints and muscles moving. It involves large muscle groups. It is also rhythmic and must be done for a longer period of time. Doing these exercises improves circulation and endurance. Examples of aerobic exercise include:  Swimming.  Walking.  Hiking.  Jogging.  Cross-country skiing.  Biking.  Dancing.  Static exercise Static exercise (isometric exercise) uses muscles at high intensities without moving the joints. Some examples of static exercise include pushing against a heavy couch that does not move, doing a wall sit, or holding a plank position. Static exercise improves strength but also quickly increases blood pressure. Follow these guidelines:  If you have circulation problems or high blood pressure, talk with your health care provider before starting any static exercise routines. Do not do static exercises if your health care provider tells you not to.  Do not hold your breath while doing static exercises. Holding your breath during static exercises can raise your blood pressure to a dangerously high level.  Weight-resistance exercise Weight-resistance exercises are another important part of rehabilitation. These exercises strengthen your muscles by making them work against resistance. Resistance exercises may help you return to activities of daily living sooner and improve your quality of life. They also help reduce cardiac risk factors. Examples of weight-resistance exercise include using:  Free weights.  Weight-lifting machines.  Large, specially designed rubber bands.  You will usually do weight-resistance exercises 2 times a week with a 2-day rest period  between workouts. Stretching Stretching before you exercise warms up your muscles and prevents injury. Stretching also improves your flexibility, balance, coordination, and range of motion. Follow these guidelines:  Stretch both before and after exercising.  Do not force a muscle or joint into a painful angle. Stretching should be a relaxing part of your exercise routine.  Once you feel resistance in your muscle, hold the stretch for a few seconds. Make sure you keep breathing while you hold the stretch.  Go slowly when doing all stretches.  Setting a pace  Choose a pace that is comfortable for you. ? You should be able to talk while exercising. If you are short of breath or unable to speak while you exercise, slow down. ? If you are able to sing while exercising, you are not exercising hard enough.  Keep track of how hard you are working as you exercise (exertion level). Your rehabilitation therapist can teach you to use a mental scale to measure your level of exertion (perceived exertion). Using a mental scale, you will think about your exertion level and rate it in a range from 6 to 20. ? A rating of 6 to 9. This means that you are doing "very light" exercise and are not exerting yourself enough. For a healthy person, this may be walking at a slow pace. ? A rating of 11 to 15. This is exercise that is "somewhat hard." For a healthy exercise session,  you should aim for an exertion rate that is within this range. ? A rating of 16 to 17. This is considered "very hard" or strenuous. For a healthy person, exercise at this rating may start to feel heavy and difficult. ? A rating of 19 to 20. This means that you are working "extremely hard." For most people, these numbers represent the hardest you've ever worked to exercise.  Your health care provider or cardiac rehabilitation specialist may also recommend that you wear a heart rate monitor while you exercise. This will help you keep track of  your heart rate zones and how hard your heart is working. Frequency As you are recovering, it is important to start exercising slowly and to gradually work up to your goal. Work with your health care provider to set up an exercise routine that works for you. Generally, cardiac rehabilitation exercise should include:  40 minutes of aerobic activity 3 - 4 days a week.  Stretching and strength exercises 2 - 3 days a week.  Contact a doctor if:  You have any of the following symptoms while exercising: ? Pain, pressure, or burning in your chest, jaw, shoulder, or back (angina). ? Lightheadedness. ? Dizziness. ? Irregular or fast heartbeat. ? Shortness of breath.  You are extremely tired after exercising. Get help right away if:  You have angina that does not get better with medicine and lasts for more than 5 minutes.  You have nausea or you vomit.  You have excessive sweating that is not caused by exercise. Summary  When you are recovering from a heart condition, it is important to have heart-healthy habits, including exercise routines.  At first, you may begin exercising under supervision, such as at a hospital or clinic. Over time, you may begin exercising at home, with your health care provider's approval.  Aim for 40 minutes of aerobic exercises 3 - 4 days a week.  Aim to do stretching and strength exercises 2 - 3 days a week.  Choose a pace that is comfortable for you. You should be able to talk while exercising. This information is not intended to replace advice given to you by your health care provider. Make sure you discuss any questions you have with your health care provider. Document Released: 05/28/2013 Document Revised: 04/22/2016 Document Reviewed: 04/22/2016 Elsevier Interactive Patient Education  2018 [employer].    DASH Eating Plan DASH stands for "Dietary Approaches to Stop Hypertension." The DASH eating plan is a healthy eating plan that has been shown to  reduce high blood pressure (hypertension). It may also reduce your risk for type 2 diabetes, heart disease, and stroke. The DASH eating plan may also help with weight loss. What are tips for following this plan? General guidelines  Avoid eating more than 2,300 mg (milligrams) of salt (sodium) a day. If you have hypertension, you may need to reduce your sodium intake to 1,500 mg a day.  Limit alcohol intake to no more than 1 drink a day for nonpregnant women and 2 drinks a day for men. One drink equals 12 oz of beer, 5 oz of wine, or 1 oz of hard liquor.  Work with your health care provider to maintain a healthy body weight or to lose weight. Ask what an ideal weight is for you.  Get at least 30 minutes of exercise that causes your heart to beat faster (aerobic exercise) most days of the week. Activities may include walking, swimming, or biking.  Work with your health  care provider or diet and nutrition specialist (dietitian) to adjust your eating plan to your individual calorie needs. Reading food labels  Check food labels for the amount of sodium per serving. Choose foods with less than 5 percent of the Daily Value of sodium. Generally, foods with less than 300 mg of sodium per serving fit into this eating plan.  To find whole grains, look for the word "whole" as the first word in the ingredient list. Shopping  Buy products labeled as "low-sodium" or "no salt added."  Buy fresh foods. Avoid canned foods and premade or frozen meals. Cooking  Avoid adding salt when cooking. Use salt-free seasonings or herbs instead of table salt or sea salt. Check with your health care provider or pharmacist before using salt substitutes.  Do not fry foods. Cook foods using healthy methods such as baking, boiling, grilling, and broiling instead.  Cook with heart-healthy oils, such as olive, canola, soybean, or sunflower oil. Meal planning   Eat a balanced diet that includes: ? 5 or more servings of  fruits and vegetables each day. At each meal, try to fill half of your plate with fruits and vegetables. ? Up to 6-8 servings of whole grains each day. ? Less than 6 oz of lean meat, poultry, or fish each day. A 3-oz serving of meat is about the same size as a deck of cards. One egg equals 1 oz. ? 2 servings of low-fat dairy each day. ? A serving of nuts, seeds, or beans 5 times each week. ? Heart-healthy fats. Healthy fats called Omega-3 fatty acids are found in foods such as flaxseeds and coldwater fish, like sardines, salmon, and mackerel.  Limit how much you eat of the following: ? Canned or prepackaged foods. ? Food that is high in trans fat, such as fried foods. ? Food that is high in saturated fat, such as fatty meat. ? Sweets, desserts, sugary drinks, and other foods with added sugar. ? Full-fat dairy products.  Do not salt foods before eating.  Try to eat at least 2 vegetarian meals each week.  Eat more home-cooked food and less restaurant, buffet, and fast food.  When eating at a restaurant, ask that your food be prepared with less salt or no salt, if possible. What foods are recommended? The items listed may not be a complete list. Talk with your dietitian about what dietary choices are best for you. Grains Whole-grain or whole-wheat bread. Whole-grain or whole-wheat pasta. Brown rice. Orpah Cobb. Bulgur. Whole-grain and low-sodium cereals. Pita bread. Low-fat, low-sodium crackers. Whole-wheat flour tortillas. Vegetables Fresh or frozen vegetables (raw, steamed, roasted, or grilled). Low-sodium or reduced-sodium tomato and vegetable juice. Low-sodium or reduced-sodium tomato sauce and tomato paste. Low-sodium or reduced-sodium canned vegetables. Fruits All fresh, dried, or frozen fruit. Canned fruit in natural juice (without added sugar). Meat and other protein foods Skinless chicken or [place]. Ground chicken or [place]. Pork with fat trimmed off. Fish and seafood. Egg  whites. Dried beans, peas, or lentils. Unsalted nuts, nut butters, and seeds. Unsalted canned beans. Lean cuts of beef with fat trimmed off. Low-sodium, lean deli meat. Dairy Low-fat (1%) or fat-free (skim) milk. Fat-free, low-fat, or reduced-fat cheeses. Nonfat, low-sodium ricotta or cottage cheese. Low-fat or nonfat yogurt. Low-fat, low-sodium cheese. Fats and oils Soft margarine without trans fats. Vegetable oil. Low-fat, reduced-fat, or light mayonnaise and salad dressings (reduced-sodium). Canola, safflower, olive, soybean, and sunflower oils. Avocado. Seasoning and other foods Herbs. Spices. Seasoning mixes without salt. Unsalted popcorn  and pretzels. Fat-free sweets. What foods are not recommended? The items listed may not be a complete list. Talk with your dietitian about what dietary choices are best for you. Grains Baked goods made with fat, such as croissants, muffins, or some breads. Dry pasta or rice meal packs. Vegetables Creamed or fried vegetables. Vegetables in a cheese sauce. Regular canned vegetables (not low-sodium or reduced-sodium). Regular canned tomato sauce and paste (not low-sodium or reduced-sodium). Regular tomato and vegetable juice (not low-sodium or reduced-sodium). Rosita Fire. Olives. Fruits Canned fruit in a light or heavy syrup. Fried fruit. Fruit in cream or butter sauce. Meat and other protein foods Fatty cuts of meat. Ribs. Fried meat. Tomasa Blase. Sausage. Bologna and other processed lunch meats. Salami. Fatback. Hotdogs. Bratwurst. Salted nuts and seeds. Canned beans with added salt. Canned or smoked fish. Whole eggs or egg yolks. Chicken or [place] with skin. Dairy Whole or 2% milk, cream, and half-and-half. Whole or full-fat cream cheese. Whole-fat or sweetened yogurt. Full-fat cheese. Nondairy creamers. Whipped toppings. Processed cheese and cheese spreads. Fats and oils Butter. Stick margarine. Lard. Shortening. Ghee. Bacon fat. Tropical oils, such as coconut,  palm kernel, or palm oil. Seasoning and other foods Salted popcorn and pretzels. Onion salt, garlic salt, seasoned salt, table salt, and sea salt. Worcestershire sauce. Tartar sauce. Barbecue sauce. Teriyaki sauce. Soy sauce, including reduced-sodium. Steak sauce. Canned and packaged gravies. Fish sauce. Oyster sauce. Cocktail sauce. Horseradish that you find on the shelf. Ketchup. Mustard. Meat flavorings and tenderizers. Bouillon cubes. Hot sauce and Tabasco sauce. Premade or packaged marinades. Premade or packaged taco seasonings. Relishes. Regular salad dressings. Where to find more information:  National Heart, Lung, and Blood Institute: PopSteam.is  American Heart Association: www.heart.org Summary  The DASH eating plan is a healthy eating plan that has been shown to reduce high blood pressure (hypertension). It may also reduce your risk for type 2 diabetes, heart disease, and stroke.  With the DASH eating plan, you should limit salt (sodium) intake to 2,300 mg a day. If you have hypertension, you may need to reduce your sodium intake to 1,500 mg a day.  When on the DASH eating plan, aim to eat more fresh fruits and vegetables, whole grains, lean proteins, low-fat dairy, and heart-healthy fats.  Work with your health care provider or diet and nutrition specialist (dietitian) to adjust your eating plan to your individual calorie needs. This information is not intended to replace advice given to you by your health care provider. Make sure you discuss any questions you have with your health care provider. Document Released: 05/12/2011 Document Revised: 05/16/2016 Document Reviewed: 05/16/2016 Elsevier Interactive Patient Education  [employer].   If you need a refill on your cardiac medications before your next appointment, please call your pharmacy.   Thank you for choosing [facility]! Eligha Bridegroom, RN [phone]       Signed, Berton Bon, NP    08/31/2017 12:25 PM    [facility]

## 2017-08-31 NOTE — Patient Instructions (Signed)
Medication Instructions:  Your physician has recommended you make the following change in your medication:   INCREASE Carvedilol (Coreg) to 25 mg twice daily   Labwork: Your physician recommends that you return for lab work in: May   You will need to FAST for this appointment - nothing to eat or drink after midnight the night before except water.   Testing/Procedures: Your physician has recommended that you have a sleep study. This test records several body functions during sleep, including: brain activity, eye movement, oxygen and carbon dioxide blood levels, heart rate and rhythm, breathing rate and rhythm, the flow of air through your mouth and nose, snoring, body muscle movements, and chest and belly movement.   Follow-Up: Your physician recommends that you schedule a follow-up appointment in: 3-4 months with Dr. Mayford Knife   Exercise Guidelines During Cardiac Rehabilitation When you are recovering from a heart condition, such as from heart surgery, heart attack, or heart failure, it is important to have heart-healthy habits, including exercise routines. Discuss an appropriate exercise program with your heart specialist (cardiologist) and rehabilitation therapist. It is important to design a program that is safe and effective for you. The program should meet your specific abilities and needs. Walking, biking, jogging, and swimming are all good aerobic activities. These take light to moderate effort. Adding some light resistance training is also important. Even simple lifestyle changes can help. These lifestyle changes may include parking farther from the store or taking the stairs instead of the elevator. At first, you may begin exercising under supervision, such as at a hospital or clinic. Over time, you may begin exercising at home, with your health care provider's approval. Types of exercise Aerobic exercise During cardiac rehabilitation, it is important to do aerobic activities. Aerobic  exercise keeps joints and muscles moving. It involves large muscle groups. It is also rhythmic and must be done for a longer period of time. Doing these exercises improves circulation and endurance. Examples of aerobic exercise include:  Swimming.  Walking.  Hiking.  Jogging.  Cross-country skiing.  Biking.  Dancing.  Static exercise Static exercise (isometric exercise) uses muscles at high intensities without moving the joints. Some examples of static exercise include pushing against a heavy couch that does not move, doing a wall sit, or holding a plank position. Static exercise improves strength but also quickly increases blood pressure. Follow these guidelines:  If you have circulation problems or high blood pressure, talk with your health care provider before starting any static exercise routines. Do not do static exercises if your health care provider tells you not to.  Do not hold your breath while doing static exercises. Holding your breath during static exercises can raise your blood pressure to a dangerously high level.  Weight-resistance exercise Weight-resistance exercises are another important part of rehabilitation. These exercises strengthen your muscles by making them work against resistance. Resistance exercises may help you return to activities of daily living sooner and improve your quality of life. They also help reduce cardiac risk factors. Examples of weight-resistance exercise include using:  Free weights.  Weight-lifting machines.  Large, specially designed rubber bands.  You will usually do weight-resistance exercises 2 times a week with a 2-day rest period between workouts. Stretching Stretching before you exercise warms up your muscles and prevents injury. Stretching also improves your flexibility, balance, coordination, and range of motion. Follow these guidelines:  Stretch both before and after exercising.  Do not force a muscle or joint into a  painful angle. Stretching  should be a relaxing part of your exercise routine.  Once you feel resistance in your muscle, hold the stretch for a few seconds. Make sure you keep breathing while you hold the stretch.  Go slowly when doing all stretches.  Setting a pace  Choose a pace that is comfortable for you. ? You should be able to talk while exercising. If you are short of breath or unable to speak while you exercise, slow down. ? If you are able to sing while exercising, you are not exercising hard enough.  Keep track of how hard you are working as you exercise (exertion level). Your rehabilitation therapist can teach you to use a mental scale to measure your level of exertion (perceived exertion). Using a mental scale, you will think about your exertion level and rate it in a range from 6 to 20. ? A rating of 6 to 9. This means that you are doing "very light" exercise and are not exerting yourself enough. For a healthy person, this may be walking at a slow pace. ? A rating of 11 to 15. This is exercise that is "somewhat hard." For a healthy exercise session, you should aim for an exertion rate that is within this range. ? A rating of 16 to 17. This is considered "very hard" or strenuous. For a healthy person, exercise at this rating may start to feel heavy and difficult. ? A rating of 19 to 20. This means that you are working "extremely hard." For most people, these numbers represent the hardest you've ever worked to exercise.  Your health care provider or cardiac rehabilitation specialist may also recommend that you wear a heart rate monitor while you exercise. This will help you keep track of your heart rate zones and how hard your heart is working. Frequency As you are recovering, it is important to start exercising slowly and to gradually work up to your goal. Work with your health care provider to set up an exercise routine that works for you. Generally, cardiac rehabilitation exercise  should include:  40 minutes of aerobic activity 3 - 4 days a week.  Stretching and strength exercises 2 - 3 days a week.  Contact a doctor if:  You have any of the following symptoms while exercising: ? Pain, pressure, or burning in your chest, jaw, shoulder, or back (angina). ? Lightheadedness. ? Dizziness. ? Irregular or fast heartbeat. ? Shortness of breath.  You are extremely tired after exercising. Get help right away if:  You have angina that does not get better with medicine and lasts for more than 5 minutes.  You have nausea or you vomit.  You have excessive sweating that is not caused by exercise. Summary  When you are recovering from a heart condition, it is important to have heart-healthy habits, including exercise routines.  At first, you may begin exercising under supervision, such as at a hospital or clinic. Over time, you may begin exercising at home, with your health care provider's approval.  Aim for 40 minutes of aerobic exercises 3 - 4 days a week.  Aim to do stretching and strength exercises 2 - 3 days a week.  Choose a pace that is comfortable for you. You should be able to talk while exercising. This information is not intended to replace advice given to you by your health care provider. Make sure you discuss any questions you have with your health care provider. Document Released: 05/28/2013 Document Revised: 04/22/2016 Document Reviewed: 04/22/2016 Elsevier Interactive Patient  Education  2018 Elsevier Inc.    DASH Eating Plan DASH stands for "Dietary Approaches to Stop Hypertension." The DASH eating plan is a healthy eating plan that has been shown to reduce high blood pressure (hypertension). It may also reduce your risk for type 2 diabetes, heart disease, and stroke. The DASH eating plan may also help with weight loss. What are tips for following this plan? General guidelines  Avoid eating more than 2,300 mg (milligrams) of salt (sodium) a day.  If you have hypertension, you may need to reduce your sodium intake to 1,500 mg a day.  Limit alcohol intake to no more than 1 drink a day for nonpregnant women and 2 drinks a day for men. One drink equals 12 oz of beer, 5 oz of wine, or 1 oz of hard liquor.  Work with your health care provider to maintain a healthy body weight or to lose weight. Ask what an ideal weight is for you.  Get at least 30 minutes of exercise that causes your heart to beat faster (aerobic exercise) most days of the week. Activities may include walking, swimming, or biking.  Work with your health care provider or diet and nutrition specialist (dietitian) to adjust your eating plan to your individual calorie needs. Reading food labels  Check food labels for the amount of sodium per serving. Choose foods with less than 5 percent of the Daily Value of sodium. Generally, foods with less than 300 mg of sodium per serving fit into this eating plan.  To find whole grains, look for the word "whole" as the first word in the ingredient list. Shopping  Buy products labeled as "low-sodium" or "no salt added."  Buy fresh foods. Avoid canned foods and premade or frozen meals. Cooking  Avoid adding salt when cooking. Use salt-free seasonings or herbs instead of table salt or sea salt. Check with your health care provider or pharmacist before using salt substitutes.  Do not fry foods. Cook foods using healthy methods such as baking, boiling, grilling, and broiling instead.  Cook with heart-healthy oils, such as olive, canola, soybean, or sunflower oil. Meal planning   Eat a balanced diet that includes: ? 5 or more servings of fruits and vegetables each day. At each meal, try to fill half of your plate with fruits and vegetables. ? Up to 6-8 servings of whole grains each day. ? Less than 6 oz of lean meat, poultry, or fish each day. A 3-oz serving of meat is about the same size as a deck of cards. One egg equals 1 oz. ? 2  servings of low-fat dairy each day. ? A serving of nuts, seeds, or beans 5 times each week. ? Heart-healthy fats. Healthy fats called Omega-3 fatty acids are found in foods such as flaxseeds and coldwater fish, like sardines, salmon, and mackerel.  Limit how much you eat of the following: ? Canned or prepackaged foods. ? Food that is high in trans fat, such as fried foods. ? Food that is high in saturated fat, such as fatty meat. ? Sweets, desserts, sugary drinks, and other foods with added sugar. ? Full-fat dairy products.  Do not salt foods before eating.  Try to eat at least 2 vegetarian meals each week.  Eat more home-cooked food and less restaurant, buffet, and fast food.  When eating at a restaurant, ask that your food be prepared with less salt or no salt, if possible. What foods are recommended? The items listed may not be  a complete list. Talk with your dietitian about what dietary choices are best for you. Grains Whole-grain or whole-wheat bread. Whole-grain or whole-wheat pasta. Brown rice. Orpah Cobb. Bulgur. Whole-grain and low-sodium cereals. Pita bread. Low-fat, low-sodium crackers. Whole-wheat flour tortillas. Vegetables Fresh or frozen vegetables (raw, steamed, roasted, or grilled). Low-sodium or reduced-sodium tomato and vegetable juice. Low-sodium or reduced-sodium tomato sauce and tomato paste. Low-sodium or reduced-sodium canned vegetables. Fruits All fresh, dried, or frozen fruit. Canned fruit in natural juice (without added sugar). Meat and other protein foods Skinless chicken or [place]. Ground chicken or [place]. Pork with fat trimmed off. Fish and seafood. Egg whites. Dried beans, peas, or lentils. Unsalted nuts, nut butters, and seeds. Unsalted canned beans. Lean cuts of beef with fat trimmed off. Low-sodium, lean deli meat. Dairy Low-fat (1%) or fat-free (skim) milk. Fat-free, low-fat, or reduced-fat cheeses. Nonfat, low-sodium ricotta or cottage cheese.  Low-fat or nonfat yogurt. Low-fat, low-sodium cheese. Fats and oils Soft margarine without trans fats. Vegetable oil. Low-fat, reduced-fat, or light mayonnaise and salad dressings (reduced-sodium). Canola, safflower, olive, soybean, and sunflower oils. Avocado. Seasoning and other foods Herbs. Spices. Seasoning mixes without salt. Unsalted popcorn and pretzels. Fat-free sweets. What foods are not recommended? The items listed may not be a complete list. Talk with your dietitian about what dietary choices are best for you. Grains Baked goods made with fat, such as croissants, muffins, or some breads. Dry pasta or rice meal packs. Vegetables Creamed or fried vegetables. Vegetables in a cheese sauce. Regular canned vegetables (not low-sodium or reduced-sodium). Regular canned tomato sauce and paste (not low-sodium or reduced-sodium). Regular tomato and vegetable juice (not low-sodium or reduced-sodium). Rosita Fire. Olives. Fruits Canned fruit in a light or heavy syrup. Fried fruit. Fruit in cream or butter sauce. Meat and other protein foods Fatty cuts of meat. Ribs. Fried meat. Tomasa Blase. Sausage. Bologna and other processed lunch meats. Salami. Fatback. Hotdogs. Bratwurst. Salted nuts and seeds. Canned beans with added salt. Canned or smoked fish. Whole eggs or egg yolks. Chicken or [place] with skin. Dairy Whole or 2% milk, cream, and half-and-half. Whole or full-fat cream cheese. Whole-fat or sweetened yogurt. Full-fat cheese. Nondairy creamers. Whipped toppings. Processed cheese and cheese spreads. Fats and oils Butter. Stick margarine. Lard. Shortening. Ghee. Bacon fat. Tropical oils, such as coconut, palm kernel, or palm oil. Seasoning and other foods Salted popcorn and pretzels. Onion salt, garlic salt, seasoned salt, table salt, and sea salt. Worcestershire sauce. Tartar sauce. Barbecue sauce. Teriyaki sauce. Soy sauce, including reduced-sodium. Steak sauce. Canned and packaged gravies. Fish sauce.  Oyster sauce. Cocktail sauce. Horseradish that you find on the shelf. Ketchup. Mustard. Meat flavorings and tenderizers. Bouillon cubes. Hot sauce and Tabasco sauce. Premade or packaged marinades. Premade or packaged taco seasonings. Relishes. Regular salad dressings. Where to find more information:  National Heart, Lung, and Blood Institute: PopSteam.is  American Heart Association: www.heart.org Summary  The DASH eating plan is a healthy eating plan that has been shown to reduce high blood pressure (hypertension). It may also reduce your risk for type 2 diabetes, heart disease, and stroke.  With the DASH eating plan, you should limit salt (sodium) intake to 2,300 mg a day. If you have hypertension, you may need to reduce your sodium intake to 1,500 mg a day.  When on the DASH eating plan, aim to eat more fresh fruits and vegetables, whole grains, lean proteins, low-fat dairy, and heart-healthy fats.  Work with your health care provider or diet and nutrition specialist (  dietitian) to adjust your eating plan to your individual calorie needs. This information is not intended to replace advice given to you by your health care provider. Make sure you discuss any questions you have with your health care provider. Document Released: 05/12/2011 Document Revised: 05/16/2016 Document Reviewed: 05/16/2016 Elsevier Interactive Patient Education  [employer].   If you need a refill on your cardiac medications before your next appointment, please call your pharmacy.   Thank you for choosing [facility]! Eligha Bridegroom, RN [phone]

## 2017-09-01 ENCOUNTER — Telehealth: Payer: Self-pay | Admitting: *Deleted

## 2017-09-01 NOTE — Telephone Encounter (Signed)
Sent to sleep pool/ pre cert 

## 2017-09-01 NOTE — Telephone Encounter (Signed)
-----   Message from Levi AlandMichelle M Swinyer, RN sent at 08/31/2017 11:34 AM EDT ----- Please schedule patient for sleep study.  He saw Lizabeth LeydenNina Hammond and will f/u with Dr. Mayford Knifeurner as primary cardiologist. His Epworth sleepiness score is 3.  Thank you, Marcelino DusterMichelle

## 2017-09-05 ENCOUNTER — Telehealth (HOSPITAL_COMMUNITY): Payer: Self-pay

## 2017-09-05 NOTE — Telephone Encounter (Signed)
2nd attempt to call patient in regards to Cardiac Rehab - no vm has been set up.

## 2017-09-05 NOTE — Telephone Encounter (Signed)
Patient returned phone call in regards to Cardiac Rehab - Patient stated he cannot participate in the Cardiac Rehab program due to work. Closed referral.

## 2017-09-07 ENCOUNTER — Encounter: Payer: Self-pay | Admitting: Urgent Care

## 2017-09-07 ENCOUNTER — Ambulatory Visit (INDEPENDENT_AMBULATORY_CARE_PROVIDER_SITE_OTHER): Payer: 59 | Admitting: Urgent Care

## 2017-09-07 VITALS — BP 132/82 | HR 80 | Temp 98.4°F | Resp 18 | Ht 70.0 in | Wt 248.4 lb

## 2017-09-07 DIAGNOSIS — Z1211 Encounter for screening for malignant neoplasm of colon: Secondary | ICD-10-CM | POA: Diagnosis not present

## 2017-09-07 DIAGNOSIS — I2510 Atherosclerotic heart disease of native coronary artery without angina pectoris: Secondary | ICD-10-CM | POA: Diagnosis not present

## 2017-09-07 DIAGNOSIS — E1169 Type 2 diabetes mellitus with other specified complication: Secondary | ICD-10-CM | POA: Diagnosis not present

## 2017-09-07 DIAGNOSIS — E669 Obesity, unspecified: Secondary | ICD-10-CM | POA: Diagnosis not present

## 2017-09-07 DIAGNOSIS — I10 Essential (primary) hypertension: Secondary | ICD-10-CM

## 2017-09-07 DIAGNOSIS — Z23 Encounter for immunization: Secondary | ICD-10-CM | POA: Diagnosis not present

## 2017-09-07 DIAGNOSIS — Z955 Presence of coronary angioplasty implant and graft: Secondary | ICD-10-CM | POA: Diagnosis not present

## 2017-09-07 MED ORDER — METFORMIN HCL 1000 MG PO TABS: 1000.0000 mg | ORAL_TABLET | Freq: Two times a day (BID) | ORAL | 3 refills | Status: DC

## 2017-09-07 NOTE — Progress Notes (Signed)
    MRN: [id number] DOB: Dec 27, 1965  Subjective:   Clifford LeachSteve L Frederick is a 52 y.o. male presenting to establish care. Patient was recently hospitalized for unstable angina, was diagnosed with CAD, had stent placed. [facility]. He reports doing well, has made significant dietary changes, quit smoking. Denies blurred vision, polydipsia, chest pain, nausea, vomiting, abdominal pain, hematuria, polyuria, skin infections, numbness or tingling, weakness, headaches, foot infections. He checks his feet daily. Blood sugar checks at home run 130's-150's. Last a1c in March 2019 was 8.9%. He is managing his diabetes with 500mg  metformin twice daily. His BP is managed with amlodipine, lisinopril, carvedilol. His CAD is managed with ticagrelor, aspirin. He has not had chest pain since his episodes prior to hospitalization, has not needed nitroglycerin.  Clifford Frederick has a current medication list which includes the following prescription(s): amlodipine, aspirin, atorvastatin, carvedilol, lisinopril, metformin, nitroglycerin, and ticagrelor. Also has No Known Allergies.  Clifford Frederick  has a past medical history of CAD in native artery, Diabetes mellitus without complication (HCC), High cholesterol, Hypertension, and Sleep apnea. Also  has a past surgical history that includes ORIF ankle fracture (Left, 08/07/2012); LEFT HEART CATH AND CORONARY ANGIOGRAPHY (N/A, 08/16/2017); and CORONARY STENT INTERVENTION (N/A, 08/16/2017).  Objective:   Vitals: BP 132/82   Pulse 80   Temp 98.4 F (36.9 C) (Oral)   Resp 18   Ht 5\' 10"  (1.778 m)   Wt 248 lb 6.4 oz (112.7 kg)   SpO2 99%   BMI 35.64 kg/m   Physical Exam  Constitutional: He is oriented to person, place, and time. He appears well-developed and well-nourished.  HENT:  Mouth/Throat: Oropharynx is clear and moist.  Eyes: No scleral icterus.  Cardiovascular: Normal rate, regular rhythm and intact distal  pulses. Exam reveals no gallop and no friction rub.  No murmur heard. Pulmonary/Chest: No respiratory distress. He has no wheezes. He has no rales.  Musculoskeletal: He exhibits no edema.  Neurological: He is alert and oriented to person, place, and time.  Psychiatric: He has a normal mood and affect.   Diabetic Foot Exam - Simple   Simple Foot Form Diabetic Foot exam was performed with the following findings:  Yes 09/07/2017  9:40 AM  Visual Inspection No deformities, no ulcerations, no other skin breakdown bilaterally:  Yes Sensation Testing Intact to touch and monofilament testing bilaterally:  Yes Pulse Check Posterior Tibialis and Dorsalis pulse intact bilaterally:  Yes Comments Patient has callus on the sole of his right and left feet     Assessment and Plan :   Diabetes mellitus type 2 in obese (HCC) - Plan: [facility] DIABETES FOOT EXAM, Ambulatory referral to Ophthalmology  Coronary artery disease involving native heart without angina pectoris, unspecified vessel or lesion type  S/P coronary artery stent placement  Essential hypertension  Colon cancer screening - Plan: Ambulatory referral to Gastroenterology  Patient is doing well, he is stable. Labs pending. Will have patient increase his metformin to 1,000mg  twice daily. Referrals pending for colonoscopy and formal eye exam. Follow up in 3 months.   Wallis BambergMario Fatma Rutten, PA-C [facility] [phone] 09/07/2017  8:47 AM

## 2017-09-07 NOTE — Patient Instructions (Addendum)
For better control of your diabetes, we need to increase your Metformin dose to 1,000mg  twice daily. Right now you have 500mg  tablets, so that means you need to take 2 pills of Metformin twice daily with food.     Salads - kale, spinach, cabbage, spring mix; use seeds like pumpkin seeds or sunflower seeds; you can also use 1-2 hard boiled eggs. Fruits - avocadoes, berries (blueberries, raspberries, blackberries), apples, oranges, pomegranate, grapefruit Seeds - quinoa, chia seeds, flax seeds Vegetables - aspargus, cauliflower, broccoli, green beans, brussel spouts, bell peppers; stay away from starchy vegetables like potatoes, carrots, peas    Health Maintenance, Male A healthy lifestyle and preventive care is important for your health and wellness. Ask your health care provider about what schedule of regular examinations is right for you. What should I know about weight and diet? Eat a Healthy Diet  Eat plenty of vegetables, fruits, whole grains, low-fat dairy products, and lean protein.  Do not eat a lot of foods high in solid fats, added sugars, or salt.  Maintain a Healthy Weight Regular exercise can help you achieve or maintain a healthy weight. You should:  Do at least 150 minutes of exercise each week. The exercise should increase your heart rate and make you sweat (moderate-intensity exercise).  Do strength-training exercises at least twice a week.  Watch Your Levels of Cholesterol and Blood Lipids  Have your blood tested for lipids and cholesterol every 5 years starting at 52 years of age. If you are at high risk for heart disease, you should start having your blood tested when you are 52 years old. You may need to have your cholesterol levels checked more often if: ? Your lipid or cholesterol levels are high. ? You are older than 52 years of age. ? You are at high risk for heart disease.  What should I know about cancer screening? Many types of cancers can be detected  early and may often be prevented. Lung Cancer  You should be screened every year for lung cancer if: ? You are a current smoker who has smoked for at least 30 years. ? You are a former smoker who has quit within the past 15 years.  Talk to your health care provider about your screening options, when you should start screening, and how often you should be screened.  Colorectal Cancer  Routine colorectal cancer screening usually begins at 52 years of age and should be repeated every 5-10 years until you are 52 years old. You may need to be screened more often if early forms of precancerous polyps or small growths are found. Your health care provider may recommend screening at an earlier age if you have risk factors for colon cancer.  Your health care provider may recommend using home test kits to check for hidden blood in the stool.  A small camera at the end of a tube can be used to examine your colon (sigmoidoscopy or colonoscopy). This checks for the earliest forms of colorectal cancer.  Prostate and Testicular Cancer  Depending on your age and overall health, your health care provider may do certain tests to screen for prostate and testicular cancer.  Talk to your health care provider about any symptoms or concerns you have about testicular or prostate cancer.  Skin Cancer  Check your skin from head to toe regularly.  Tell your health care provider about any new moles or changes in moles, especially if: ? There is a change in a mole's size,  shape, or color. ? You have a mole that is larger than a pencil eraser.  Always use sunscreen. Apply sunscreen liberally and repeat throughout the day.  Protect yourself by wearing long sleeves, pants, a wide-brimmed hat, and sunglasses when outside.  What should I know about heart disease, diabetes, and high blood pressure?  If you are 24-45 years of age, have your blood pressure checked every 3-5 years. If you are 10 years of age or older,  have your blood pressure checked every year. You should have your blood pressure measured twice--once when you are at a hospital or clinic, and once when you are not at a hospital or clinic. Record the average of the two measurements. To check your blood pressure when you are not at a hospital or clinic, you can use: ? An automated blood pressure machine at a pharmacy. ? A home blood pressure monitor.  Talk to your health care provider about your target blood pressure.  If you are between 40-68 years old, ask your health care provider if you should take aspirin to prevent heart disease.  Have regular diabetes screenings by checking your fasting blood sugar level. ? If you are at a normal weight and have a low risk for diabetes, have this test once every three years after the age of 58. ? If you are overweight and have a high risk for diabetes, consider being tested at a younger age or more often.  A one-time screening for abdominal aortic aneurysm (AAA) by ultrasound is recommended for men aged 65-75 years who are current or former smokers. What should I know about preventing infection? Hepatitis B If you have a higher risk for hepatitis B, you should be screened for this virus. Talk with your health care provider to find out if you are at risk for hepatitis B infection. Hepatitis C Blood testing is recommended for:  Everyone born from [birth year] through 1965.  Anyone with known risk factors for hepatitis C.  Sexually Transmitted Diseases (STDs)  You should be screened each year for STDs including gonorrhea and chlamydia if: ? You are sexually active and are younger than 52 years of age. ? You are older than 52 years of age and your health care provider tells you that you are at risk for this type of infection. ? Your sexual activity has changed since you were last screened and you are at an increased risk for chlamydia or gonorrhea. Ask your health care provider if you are at risk.  Talk  with your health care provider about whether you are at high risk of being infected with HIV. Your health care provider may recommend a prescription medicine to help prevent HIV infection.  What else can I do?  Schedule regular health, dental, and eye exams.  Stay current with your vaccines (immunizations).  Do not use any tobacco products, such as cigarettes, chewing tobacco, and e-cigarettes. If you need help quitting, ask your health care provider.  Limit alcohol intake to no more than 2 drinks per day. One drink equals 12 ounces of beer, 5 ounces of wine, or 1 ounces of hard liquor.  Do not use street drugs.  Do not share needles.  Ask your health care provider for help if you need support or information about quitting drugs.  Tell your health care provider if you often feel depressed.  Tell your health care provider if you have ever been abused or do not feel safe at home. This information is not intended  to replace advice given to you by your health care provider. Make sure you discuss any questions you have with your health care provider. Document Released: 11/19/2007 Document Revised: 01/20/2016 Document Reviewed: 02/24/2015 Elsevier Interactive Patient Education  2018 [employer]     IF you received an x-ray today, you will receive an invoice from [facility]. Please contact [facility] at [phone] with questions or concerns regarding your invoice.   IF you received labwork today, you will receive an invoice from [facility]. Please contact [facility] at [phone] with questions or concerns regarding your invoice.   Our billing staff will not be able to assist you with questions regarding bills from these companies.  You will be contacted with the lab results as soon as they are available. The fastest way to get your results is to activate your My Chart account. Instructions are located on the last page of this paperwork. If you have not heard from  us regarding the results in 2 weeks, please contact this office.

## 2017-09-12 ENCOUNTER — Telehealth: Payer: Self-pay | Admitting: *Deleted

## 2017-09-12 NOTE — Telephone Encounter (Signed)
Nina notified [employer] approved sleep study. Okay to schedule. Approval ID # [id number].

## 2017-09-14 NOTE — Telephone Encounter (Signed)
Patient is scheduled for lab study on Oct 08 2017. Patient understands [facility]. Patient understands she will receive a sleep packet in a week or so. Patient understands to call if she does not receive the sleep packet in a timely manner. Patient agrees with treatment and thanked me for call.

## 2017-09-19 ENCOUNTER — Telehealth: Payer: Self-pay | Admitting: Urgent Care

## 2017-09-19 NOTE — Telephone Encounter (Signed)
Please advise 

## 2017-09-19 NOTE — Telephone Encounter (Signed)
Copied from CRM [phone]. Topic: Inquiry >> Sep 19, 2017 10:49 AM Everardo [place], Jonie Burdell, [place] wrote: Reason for CRM: Marylene Landngela is call because she needs a copy of the patients last OV and notes sent to there office at [phone] fax number. If anyone has any further questions she can be reached at [phone]

## 2017-09-19 NOTE — Telephone Encounter (Signed)
OV note faxed 4/16

## 2017-09-28 ENCOUNTER — Encounter: Payer: Self-pay | Admitting: Urgent Care

## 2017-09-28 ENCOUNTER — Ambulatory Visit (INDEPENDENT_AMBULATORY_CARE_PROVIDER_SITE_OTHER): Payer: 59 | Admitting: Urgent Care

## 2017-09-28 ENCOUNTER — Ambulatory Visit (INDEPENDENT_AMBULATORY_CARE_PROVIDER_SITE_OTHER): Payer: 59

## 2017-09-28 VITALS — BP 140/80 | HR 89 | Resp 18 | Ht 70.0 in | Wt 248.0 lb

## 2017-09-28 DIAGNOSIS — I519 Heart disease, unspecified: Secondary | ICD-10-CM | POA: Diagnosis not present

## 2017-09-28 DIAGNOSIS — E1169 Type 2 diabetes mellitus with other specified complication: Secondary | ICD-10-CM

## 2017-09-28 DIAGNOSIS — K219 Gastro-esophageal reflux disease without esophagitis: Secondary | ICD-10-CM | POA: Diagnosis not present

## 2017-09-28 DIAGNOSIS — E669 Obesity, unspecified: Secondary | ICD-10-CM | POA: Diagnosis not present

## 2017-09-28 DIAGNOSIS — G8929 Other chronic pain: Secondary | ICD-10-CM | POA: Diagnosis not present

## 2017-09-28 DIAGNOSIS — M25512 Pain in left shoulder: Secondary | ICD-10-CM

## 2017-09-28 DIAGNOSIS — M25522 Pain in left elbow: Secondary | ICD-10-CM | POA: Diagnosis not present

## 2017-09-28 DIAGNOSIS — Z955 Presence of coronary angioplasty implant and graft: Secondary | ICD-10-CM

## 2017-09-28 MED ORDER — OMEPRAZOLE 20 MG PO CPDR: 20.0000 mg | DELAYED_RELEASE_CAPSULE | Freq: Every day | ORAL | 3 refills | Status: DC

## 2017-09-28 MED ORDER — FAMOTIDINE 20 MG PO TABS: 20.0000 mg | ORAL_TABLET | Freq: Two times a day (BID) | ORAL | 1 refills | Status: DC

## 2017-09-28 MED ORDER — CYCLOBENZAPRINE HCL 5 MG PO TABS: 5.0000 mg | ORAL_TABLET | Freq: Three times a day (TID) | ORAL | 1 refills | Status: DC | PRN

## 2017-09-28 NOTE — Progress Notes (Addendum)
   MRN: [id number] DOB: [birth year]  Subjective:   Clifford Frederick is a 52 y.o. male presenting for an history of daily intermittent, achy left shoulder pain that radiates down to the deltoid.  Patient works as a Museum/gallery exhibitions officerforklift driver, denies doing a lot of heavy lifting.  He has not tried any medications for pain relief because he is concerned about his heart, just had a stent placed earlier this year.  Denies swelling, warmth, redness, trauma, falls, history of shoulder injuries or surgeries.  Clifford Frederick has a current medication list which includes the following prescription(s): amlodipine, aspirin, atorvastatin, carvedilol, lisinopril, metformin, nitroglycerin, and ticagrelor. Also has No Known Allergies.  Clifford Frederick  has a past medical history of CAD in native artery, Diabetes mellitus without complication (HCC), High cholesterol, Hypertension, and Sleep apnea. Also  has a past surgical history that includes ORIF ankle fracture (Left, 08/07/2012); LEFT HEART CATH AND CORONARY ANGIOGRAPHY (N/A, 08/16/2017); and CORONARY STENT INTERVENTION (N/A, 08/16/2017).  Objective:   Vitals: BP 140/80   Pulse 89   Resp 18   Ht 5\' 10"  (1.778 m)   Wt 248 lb (112.5 kg)   SpO2 99%   BMI 35.58 kg/m   Physical Exam  Constitutional: He is oriented to person, place, and time. He appears well-developed and well-nourished.  Cardiovascular: Normal rate.  Pulmonary/Chest: Effort normal.  Musculoskeletal:       Left shoulder: He exhibits normal range of motion, no tenderness, no bony tenderness, no swelling, no effusion, no crepitus, no deformity, no laceration and no spasm.  Neurological: He is alert and oriented to person, place, and time.   Dg Shoulder Left  Result Date: 09/28/2017 CLINICAL DATA:  chronic left shoulder pain without known trauma EXAM: LEFT SHOULDER - 2+ VIEW COMPARISON:  None. FINDINGS: Visualized portion of the left hemithorax is normal. No acute fracture or dislocation. Mild degenerative irregularity of the  undersurface of the acromioclavicular joint. Suspect minimal calcification involving the rotator cuff insertion. IMPRESSION: Degenerative change, without acute osseous finding. Electronically Signed   By: Jeronimo GreavesKyle  Talbot M.D.   On: 09/28/2017 09:08   Assessment and Plan :   Chronic left shoulder pain - Plan: DG Shoulder Left, Ambulatory referral to Orthopedic Surgery  Chronic elbow pain, left - Plan: Ambulatory referral to Orthopedic Surgery  Diabetes mellitus type 2 in obese (HCC)  Heart disease  H/O heart artery stent  Gastroesophageal reflux disease, esophagitis presence not specified  Radiology report pending, will have patient start Flexeril and maintain his hydration.  Will consider short steroid course given the fact that he is taking ticagrelor right now.  And together with his history of heart disease is not a good candidate to take NSAIDs.  We will follow-up with radiology report.  [name], PA-C Primary Care at [facility] [phone] 09/28/2017  8:26 AM   UPDATE: See result note from today.

## 2017-09-28 NOTE — Patient Instructions (Addendum)
You may take 500mg  Tylenol every 6 hours for shoulder pain and inflammation.     Shoulder Pain Many things can cause shoulder pain, including:  An injury to the area.  Overuse of the shoulder.  Arthritis.  The source of the pain can be:  Inflammation.  An injury to the shoulder joint.  An injury to a tendon, ligament, or bone.  Follow these instructions at home: Take these actions to help with your pain:  Squeeze a soft ball or a foam pad as much as possible. This helps to keep the shoulder from swelling. It also helps to strengthen the arm.  Take over-the-counter and prescription medicines only as told by your health care provider.  If directed, apply ice to the area: ? Put ice in a plastic bag. ? Place a towel between your skin and the bag. ? Leave the ice on for 20 minutes, 2-3 times per day. Stop applying ice if it does not help with the pain.  If you were given a shoulder sling or immobilizer: ? Wear it as told. ? Remove it to shower or bathe. ? Move your arm as little as possible, but keep your hand moving to prevent swelling.  Contact a health care provider if:  Your pain gets worse.  Your pain is not relieved with medicines.  New pain develops in your arm, hand, or fingers. Get help right away if:  Your arm, hand, or fingers: ? Tingle. ? Become numb. ? Become swollen. ? Become painful. ? Turn white or blue. This information is not intended to replace advice given to you by your health care provider. Make sure you discuss any questions you have with your health care provider. Document Released: 03/02/2005 Document Revised: 01/17/2016 Document Reviewed: 09/15/2014 Elsevier Interactive Patient Education  2018 [employer]    Food Choices for Gastroesophageal Reflux Disease, Adult When you have gastroesophageal reflux disease (GERD), the foods you eat and your eating habits are very important. Choosing the right foods can help ease the discomfort of  GERD. Consider working with a diet and nutrition specialist (dietitian) to help you make healthy food choices. What general guidelines should I follow? Eating plan  Choose healthy foods low in fat, such as fruits, vegetables, whole grains, low-fat dairy products, and lean meat, fish, and poultry.  Eat frequent, small meals instead of three large meals each day. Eat your meals slowly, in a relaxed setting. Avoid bending over or lying down until 2-3 hours after eating.  Limit high-fat foods such as fatty meats or fried foods.  Limit your intake of oils, butter, and shortening to less than 8 teaspoons each day.  Avoid the following: ? Foods that cause symptoms. These may be different for different people. Keep a food diary to keep track of foods that cause symptoms. ? Alcohol. ? Drinking large amounts of liquid with meals. ? Eating meals during the 2-3 hours before bed.  Cook foods using methods other than frying. This may include baking, grilling, or broiling. Lifestyle   Maintain a healthy weight. Ask your health care provider what weight is healthy for you. If you need to lose weight, work with your health care provider to do so safely.  Exercise for at least 30 minutes on 5 or more days each week, or as told by your health care provider.  Avoid wearing clothes that fit tightly around your waist and chest.  Do not use any products that contain nicotine or tobacco, such as cigarettes and e-cigarettes.  If you need help quitting, ask your health care provider.  Sleep with the head of your bed raised. Use a wedge under the mattress or blocks under the bed frame to raise the head of the bed. What foods are not recommended? The items listed may not be a complete list. Talk with your dietitian about what dietary choices are best for you. Grains Pastries or quick breads with added fat. [place] toast. Vegetables Deep fried vegetables. [place] fries. Any vegetables prepared with added fat.  Any vegetables that cause symptoms. For some people this may include tomatoes and tomato products, chili peppers, onions and garlic, and horseradish. Fruits Any fruits prepared with added fat. Any fruits that cause symptoms. For some people this may include citrus fruits, such as oranges, grapefruit, pineapple, and lemons. Meats and other protein foods High-fat meats, such as fatty beef or pork, hot dogs, ribs, ham, sausage, salami and bacon. Fried meat or protein, including fried fish and fried chicken. Nuts and nut butters. Dairy Whole milk and chocolate milk. Sour cream. Cream. Ice cream. Cream cheese. Milk shakes. Beverages Coffee and tea, with or without caffeine. Carbonated beverages. Sodas. Energy drinks. Fruit juice made with acidic fruits (such as orange or grapefruit). Tomato juice. Alcoholic drinks. Fats and oils Butter. Margarine. Shortening. Ghee. Sweets and desserts Chocolate and cocoa. Donuts. Seasoning and other foods Pepper. Peppermint and spearmint. Any condiments, herbs, or seasonings that cause symptoms. For some people, this may include curry, hot sauce, or vinegar-based salad dressings. Summary  When you have gastroesophageal reflux disease (GERD), food and lifestyle choices are very important to help ease the discomfort of GERD.  Eat frequent, small meals instead of three large meals each day. Eat your meals slowly, in a relaxed setting. Avoid bending over or lying down until 2-3 hours after eating.  Limit high-fat foods such as fatty meat or fried foods. This information is not intended to replace advice given to you by your health care provider. Make sure you discuss any questions you have with your health care provider. Document Released: 05/23/2005 Document Revised: 05/24/2016 Document Reviewed: 05/24/2016 Elsevier Interactive Patient Education  2018 [employer].     IF you received an x-ray today, you will receive an invoice from [facility]. Please  contact [facility] at [phone] with questions or concerns regarding your invoice.   IF you received labwork today, you will receive an invoice from [place] [facility]. Please contact [facility] at [phone] with questions or concerns regarding your invoice.   Our billing staff will not be able to assist you with questions regarding bills from these companies.  You will be contacted with the lab results as soon as they are available. The fastest way to get your results is to activate your My Chart account. Instructions are located on the last page of this paperwork. If you have not heard from [place] regarding the results in 2 weeks, please contact this office.

## 2017-10-05 ENCOUNTER — Ambulatory Visit (HOSPITAL_BASED_OUTPATIENT_CLINIC_OR_DEPARTMENT_OTHER): Payer: 59 | Attending: Cardiology

## 2017-10-09 NOTE — Telephone Encounter (Signed)
Attempted to contact patient about missed sleep study on 10/05/17. No voicemail set up on patients mobile and work number listed is not a valid number for patient.

## 2017-10-18 ENCOUNTER — Encounter: Payer: Self-pay | Admitting: Urgent Care

## 2017-10-18 ENCOUNTER — Other Ambulatory Visit: Payer: 59

## 2017-10-19 ENCOUNTER — Other Ambulatory Visit: Payer: Self-pay | Admitting: Cardiology

## 2017-10-19 ENCOUNTER — Other Ambulatory Visit: Payer: 59 | Admitting: *Deleted

## 2017-10-19 LAB — LIPID PANEL
Chol/HDL Ratio: 2.9 ratio (ref 0.0–5.0)
Cholesterol, Total: 119 mg/dL (ref 100–199)
HDL: 41 mg/dL (ref >39)
LDL Calculated: 61 mg/dL (ref 0–99)
Triglycerides: 84 mg/dL (ref 0–149)
VLDL Cholesterol Cal: 17 mg/dL (ref 5–40)

## 2017-10-19 LAB — HEPATIC FUNCTION PANEL
ALT: 33 IU/L — ABNORMAL HIGH (ref 0–32)
AST: 26 IU/L (ref 0–40)
Albumin: 4.8 g/dL (ref 3.5–5.5)
Alkaline Phosphatase: 56 IU/L (ref 39–117)
Bilirubin Total: 0.8 mg/dL (ref 0.0–1.2)
Bilirubin, Direct: 0.19 mg/dL (ref 0.00–0.40)
Total Protein: 7.2 g/dL (ref 6.0–8.5)

## 2017-10-26 ENCOUNTER — Telehealth: Payer: Self-pay | Admitting: Urgent Care

## 2017-10-26 NOTE — Telephone Encounter (Signed)
Patient's wife, Sedalia Muta, calling and states that the patient is having bad shoulder pain. States he had an xray done, but they never received results. Please advise. CB#: [phone]    Copied from CRM [id number]. Topic: Quick Communication - Lab Results >> Sep 29, 2017 12:00 PM [place], [place], [place] wrote: Called patient to inform them of lab results. When patient returns call, triage nurse may disclose results.  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  Because of his heart disease and recent stent placement, he is not a good candidate for NSAIDs like ibuprofen, Motrin, Advil, naproxen.  He can schedule Tylenol at 500 mg every 6 hours as needed.  I am going to place a referral to an orthopedist for management as he may be a good candidate for a shoulder injection.  Thank you.

## 2017-10-26 NOTE — Telephone Encounter (Addendum)
Attempted to call pt, no answer. Unable to leave message, voice mail not set up.

## 2017-10-26 NOTE — Telephone Encounter (Signed)
Wife calling back for results of xray. Please call back, as pt is anxious to hear.

## 2017-10-26 NOTE — Telephone Encounter (Signed)
Results read to patient; verbalizes understanding. Pt would like PCP to know pain has now extended to shoulder blade, onset this AM. Aware of and agreeable to referral.

## 2017-10-31 ENCOUNTER — Ambulatory Visit (INDEPENDENT_AMBULATORY_CARE_PROVIDER_SITE_OTHER): Payer: 59 | Admitting: Family Medicine

## 2017-10-31 ENCOUNTER — Encounter: Payer: Self-pay | Admitting: Family Medicine

## 2017-10-31 VITALS — BP 130/88 | HR 86 | Temp 98.3°F | Ht 70.0 in | Wt 239.2 lb

## 2017-10-31 DIAGNOSIS — M542 Cervicalgia: Secondary | ICD-10-CM | POA: Diagnosis not present

## 2017-10-31 DIAGNOSIS — M62838 Other muscle spasm: Secondary | ICD-10-CM | POA: Diagnosis not present

## 2017-10-31 DIAGNOSIS — M25522 Pain in left elbow: Secondary | ICD-10-CM

## 2017-10-31 DIAGNOSIS — M7712 Lateral epicondylitis, left elbow: Secondary | ICD-10-CM

## 2017-10-31 MED ORDER — CYCLOBENZAPRINE HCL 5 MG PO TABS
ORAL_TABLET | ORAL | 0 refills | Status: DC
Start: 2017-10-31 — End: 2017-11-14

## 2017-10-31 NOTE — Progress Notes (Signed)
Subjective:  By signing my name below, I, Stann Ore, attest that this documentation has been prepared under the direction and in the presence of Meredith Staggers, MD. Electronically Signed: Stann Ore, Scribe. 10/31/2017 , 1:04 PM .  Patient was seen in Room 9 .   Patient ID: Clifford Frederick, male    DOB: 01-17-1966, 52 y.o.   MRN: [id number] Chief Complaint  Patient presents with  . left shoulder pain    left arm to the shoulder blade is in pain as well since Thursday    HPI Clifford Frederick is a 52 y.o. male Here for left shoulder pain radiating to his shoulder blade. He was seen in April by Wallis Bamberg, PA-C. Appears he had some pain radiating to the deltoid at that time. He had mild degenerative changes of the AC, and minimal calcification of the rotator cuff insertion. He was treated with flexeril.   He has a history of cardiac disease, and diabetes. He takes [employer] information [employer]. He had a stent placed to the RCA in March. Plan for dual anti-platelet therapy for 1 year.   Patient states he went to work on Thursday (5 days ago), and noticed left arm pain with left shoulder pain afterwards. He notes left arm pain present for about 6 months now. He denies any chest pains. He reports left arm pain occurs after getting home from work, and no pain while at work. He states left upper back pain starting 5 days ago. He placed heating pad and ice over the area with minimal relief. His wife states she noticed some swelling over his left shoulder blade. He notes trying tylenol without relief.  Patient Active Problem List   Diagnosis Date Noted  . Pure hypercholesterolemia   . Abnormal EKG   . Unstable angina (HCC)   . Acute coronary syndrome (HCC) 08/15/2017  . Chest pain 08/15/2017  . Tobacco abuse 08/15/2017  . Hypertensive urgency 08/15/2017  . Diabetes mellitus type 2 in obese (HCC) 08/15/2017  . Normocytic anemia 08/15/2017  . Essential hypertension, benign 08/08/2012  . Diabetes mellitus (HCC)  08/08/2012  . Fracture of lateral malleolus of left ankle 08/07/2012   Past Medical History:  Diagnosis Date  . CAD in native artery    A. LHC 08/16/17- DES to RCA  . Diabetes mellitus without complication (HCC)    but doesn't take his meds and hasn't in months  . High cholesterol   . Hypertension   . Sleep apnea    Past Surgical History:  Procedure Laterality Date  . CORONARY STENT INTERVENTION N/A 08/16/2017   Procedure: CORONARY STENT INTERVENTION;  Surgeon: Corky Crafts, MD;  [facility] INVASIVE CV LAB;  Service: Cardiovascular;  Laterality: N/A;  . LEFT HEART CATH AND CORONARY ANGIOGRAPHY N/A 08/16/2017   Procedure: LEFT HEART CATH AND CORONARY ANGIOGRAPHY;  Surgeon: Corky Crafts, MD;  Location: [facility] INVASIVE CV LAB;  Service: Cardiovascular;  Laterality: N/A;  . ORIF ANKLE FRACTURE Left 08/07/2012   Procedure: OPEN REDUCTION INTERNAL FIXATION (ORIF) LEFT ANKLE FRACTURE;  Surgeon: Kathryne Hitch, MD;  Location: [facility] OR;  Service: Orthopedics;  Laterality: Left;   No Known Allergies Prior to Admission medications   Medication Sig Start Date End Date Taking? Authorizing Provider  amLODipine (NORVASC) 5 MG tablet Take 1 tablet (5 mg total) by mouth daily. 08/17/17   Berton Bon, NP  aspirin 81 MG chewable tablet Chew 1 tablet (81 mg total) by mouth daily. 08/18/17   Berton Bon, NP  atorvastatin (LIPITOR) 80 MG tablet Take 1 tablet (80 mg total) by mouth daily at 6 PM. 08/17/17   Berton Bon, NP  carvedilol (COREG) 25 MG tablet Take 1 tablet (25 mg total) by mouth 2 (two) times daily. 08/31/17 11/29/17  Berton Bon, NP  cyclobenzaprine (FLEXERIL) 5 MG tablet Take 1 tablet (5 mg total) by mouth 3 (three) times daily as needed. 09/28/17   Wallis Bamberg, PA-C  famotidine (PEPCID) 20 MG tablet Take 1 tablet (20 mg total) by mouth 2 (two) times daily. 09/28/17   Wallis Bamberg, PA-C  lisinopril (PRINIVIL,ZESTRIL) 40 MG tablet Take 1 tablet (40 mg total) by mouth  daily. 08/18/17   Berton Bon, NP  metFORMIN (GLUCOPHAGE) 1000 MG tablet Take 1 tablet (1,000 mg total) by mouth 2 (two) times daily with a meal. 09/07/17 09/07/18  Wallis Bamberg, PA-C  nitroGLYCERIN (NITROSTAT) 0.4 MG SL tablet Place 1 tablet (0.4 mg total) under the tongue every 5 (five) minutes as needed for chest pain. 08/17/17   Berton Bon, NP  omeprazole (PRILOSEC) 20 MG capsule Take 1 capsule (20 mg total) by mouth daily. 09/28/17   Wallis Bamberg, PA-C  ticagrelor (BRILINTA) 90 MG TABS tablet Take 1 tablet (90 mg total) by mouth 2 (two) times daily. 08/17/17   Berton Bon, NP   Social History   Socioeconomic History  . Marital status: Married    Spouse name: Not on file  . Number of children: Not on file  . Years of education: Not on file  . Highest education level: Not on file  Occupational History  . Not on file  Social Needs  . Financial resource strain: Not on file  . Food insecurity:    Worry: Not on file    Inability: Not on file  . Transportation needs:    Medical: Not on file    Non-medical: Not on file  Tobacco Use  . Smoking status: Former Smoker    Packs/day: 1.00    Types: Cigarettes    Start date: 08/16/2017  . Smokeless tobacco: Never Used  Substance and Sexual Activity  . Alcohol use: Yes    Comment: occ  . Drug use: No  . Sexual activity: Not on file  Lifestyle  . Physical activity:    Days per week: Not on file    Minutes per session: Not on file  . Stress: Not on file  Relationships  . Social connections:    Talks on phone: Not on file    Gets together: Not on file    Attends religious service: Not on file    Active member of club or organization: Not on file    Attends meetings of clubs or organizations: Not on file    Relationship status: Not on file  . Intimate partner violence:    Fear of current or ex partner: Not on file    Emotionally abused: Not on file    Physically abused: Not on file    Forced sexual activity: Not on file  Other  Topics Concern  . Not on file  Social History Narrative  . Not on file   Review of Systems  Constitutional: Negative for fatigue and unexpected weight change.  Eyes: Negative for visual disturbance.  Respiratory: Negative for cough, chest tightness and shortness of breath.   Cardiovascular: Negative for chest pain, palpitations and leg swelling.  Gastrointestinal: Negative for abdominal pain and blood in stool.  Musculoskeletal: Positive for arthralgias, myalgias and neck pain.  Neurological: Negative  for dizziness, light-headedness and headaches.       Objective:   Physical Exam  Constitutional: He is oriented to person, place, and time. He appears well-developed and well-nourished. No distress.  HENT:  Head: Normocephalic and atraumatic.  Eyes: Pupils are equal, round, and reactive to light. EOM are normal.  Neck: Neck supple.  Cardiovascular: Normal rate.  Pulmonary/Chest: Effort normal. No respiratory distress.  Musculoskeletal: Normal range of motion.  C-spine: no midline bony tenderness, decreased extension to about 45 degrees, right rotation lacking about 20-30 degrees, left rotation about 15-20 degrees, minimal left lateral flexion, pain into left upper shoulder and left paraspinals of neck with left lateral flexion; some spasms over left side Left shoulder: full ROM, RTC strength intact, no AC/[place] clavicle tenderness, musculature non tender Left elbow: full ROM, some tenderness along lateral epicondyle and ECRB proximally, minimal discomfort with resisted wrist extension  Neurological: He is alert and oriented to person, place, and time.  Skin: Skin is warm and dry.  Psychiatric: He has a normal mood and affect. His behavior is normal.  Nursing note and vitals reviewed.   Vitals:   10/31/17 1230  BP: 130/88  Pulse: 86  Temp: 98.3 F (36.8 C)  TempSrc: Oral  SpO2: 99%  Weight: 239 lb 3.2 oz (108.5 kg)  Height:  (1.778 m)       Assessment & Plan:     Clifford Frederick is a 52 y.o. male Neck pain Muscle spasm - Plan: cyclobenzaprine (FLEXERIL) 5 MG tablet  -Suspected paraspinal spasm, trapezius spasm.  May be related to cervical spine arthritis with neuronal impingement.  No weakness, reflexes equal bilaterally.  Denies chest symptoms, unlikely cardiac source.  -Trial of Flexeril 5 mg nightly up to every 4 hours as needed, potential side effects discussed  -Recheck next few weeks for possible x-ray if not improving, sooner if worse.  ER/RTC precautions discussed  -Tylenol over-the-counter if needed, avoid NSAIDs given other antiplatelet therapies currently.  Consider prednisone if symptoms persist.  Left elbow pain - Plan: Apply other splint Lateral epicondylitis of left elbow - Plan: Apply other splint  -Reports intermittent pain to the left arm, initially thought to have some radicular quality but tender over lateral epicondyles towards ECRB.  Potentially lateral epicondylitis.  Handout given, will fit for a counterforce brace with recheck in the next 2 weeks.  -Tylenol over-the-counter if needed.  Meds ordered this encounter  Medications  . cyclobenzaprine (FLEXERIL) 5 MG tablet    Sig: 1 pill by mouth up to every 8 hours as needed. Start with one pill by mouth each bedtime as needed due to sedation    Dispense:  15 tablet    Refill:  0   Patient Instructions   For left elbow pain, try the tennis elbow splint with activity for the next week or 2.  For shoulder pain, that appears to be coming from the neck or neck muscles.  Try the muscle relaxant at bedtime.  That can be taken every 8 hours but do not take that while you are driving or operating machinery.  Tylenol is okay to take with these medications.  Please return in the next 1 to 2 weeks to recheck these areas further, sooner if worse.  Return to the clinic or go to the nearest emergency room if any of your symptoms worsen or new symptoms occur.   Tennis Elbow Tennis elbow  is puffiness (inflammation) of the outer tendons of your forearm close to your  elbow. Your tendons attach your muscles to your bones. Tennis elbow can happen in any sport or job in which you use your elbow too much. It is caused by doing the same motion over and over. Tennis elbow can cause:  Pain and tenderness in your forearm and the outer part of your elbow.  A burning feeling. This runs from your elbow through your arm.  Weak grip in your hands.  Follow these instructions at home: Activity  Rest your elbow and wrist as told by your doctor. Try to avoid any activities that caused the problem until your doctor says that you can do them again.  If a physical therapist teaches you exercises, do all of them as told.  If you lift an object, lift it with your palm facing up. This is easier on your elbow. Lifestyle  If your tennis elbow is caused by sports, check your equipment and make sure that: ? You are using it correctly. ? It fits you well.  If your tennis elbow is caused by work, take breaks often, if you are able. Talk with your manager about doing your work in a way that is safe for you. ? If your tennis elbow is caused by computer use, talk with your manager about any changes that can be made to your work setup. General instructions  If told, apply ice to the painful area: ? Put ice in a plastic bag. ? Place a towel between your skin and the bag. ? Leave the ice on for 20 minutes, 2-3 times per day.  Take medicines only as told by your doctor.  If you were given a brace, wear it as told by your doctor.  Keep all follow-up visits as told by your doctor. This is important. Contact a doctor if:  Your pain does not get better with treatment.  Your pain gets worse.  You have weakness in your forearm, hand, or fingers.  You cannot feel your forearm, hand, or fingers. This information is not intended to replace advice given to you by your health care provider. Make sure  you discuss any questions you have with your health care provider. Document Released: 11/10/2009 Document Revised: 01/21/2016 Document Reviewed: 05/19/2014 Elsevier Interactive Patient Education  2018 [employer]  Muscle Cramps and Spasms Muscle cramps and spasms are when muscles tighten by themselves. They usually get better within minutes. Muscle cramps are painful. They are usually stronger and last longer than muscle spasms. Muscle spasms may or may not be painful. They can last a few seconds or much longer. Follow these instructions at home:  Drink enough fluid to keep your pee (urine) clear or pale yellow.  Massage, stretch, and relax the muscle.  If directed, apply heat to tight or tense muscles as often as told by your doctor. Use the heat source that your doctor recommends. ? Place a towel between your skin and the heat source. ? Leave the heat on for 20-30 minutes. ? Take off the heat if your skin turns bright red. This is especially important if you are unable to feel pain, heat, or cold. You may have a greater risk of getting burned.  If directed, put ice on the affected area. This may help if you are sore or have pain after a cramp or spasm. ? Put ice in a plastic bag. ? Place a towel between your skin and the bag. ? Leave the ice on for 20 minutes, 2-3 times a day.  Take over-the-counter and  prescription medicines only as told by your doctor.  Pay attention to any changes in your symptoms. Contact a doctor if:  Your cramps or spasms get worse or happen more often.  Your cramps or spasms do not get better with time. This information is not intended to replace advice given to you by your health care provider. Make sure you discuss any questions you have with your health care provider. Document Released: 05/05/2008 Document Revised: 06/24/2015 Document Reviewed: 02/24/2015 Elsevier Interactive Patient Education  2018 [employer].   IF you received an x-ray today,  you will receive an invoice from [facility]. Please contact [facility] at [phone] with questions or concerns regarding your invoice.   IF you received labwork today, you will receive an invoice from [facility]. Please contact [facility] at [phone] with questions or concerns regarding your invoice.   Our billing staff will not be able to assist you with questions regarding bills from these companies.  You will be contacted with the lab results as soon as they are available. The fastest way to get your results is to activate your My Chart account. Instructions are located on the last page of this paperwork. If you have not heard from [place] regarding the results in 2 weeks, please contact this office.       I personally performed the services described in this documentation, which was scribed in my presence. The recorded information has been reviewed and considered for accuracy and completeness, addended by me as needed, and agree with information above.  Signed,   Meredith Staggers, MD [facility] at [facility].  10/31/17 3:23 PM

## 2017-10-31 NOTE — Patient Instructions (Addendum)
For left elbow pain, try the tennis elbow splint with activity for the next week or 2.  For shoulder pain, that appears to be coming from the neck or neck muscles.  Try the muscle relaxant at bedtime.  That can be taken every 8 hours but do not take that while you are driving or operating machinery.  Tylenol is okay to take with these medications.  Please return in the next 1 to 2 weeks to recheck these areas further, sooner if worse.  Return to the clinic or go to the nearest emergency room if any of your symptoms worsen or new symptoms occur.   Tennis Elbow Tennis elbow is puffiness (inflammation) of the outer tendons of your forearm close to your elbow. Your tendons attach your muscles to your bones. Tennis elbow can happen in any sport or job in which you use your elbow too much. It is caused by doing the same motion over and over. Tennis elbow can cause:  Pain and tenderness in your forearm and the outer part of your elbow.  A burning feeling. This runs from your elbow through your arm.  Weak grip in your hands.  Follow these instructions at home: Activity  Rest your elbow and wrist as told by your doctor. Try to avoid any activities that caused the problem until your doctor says that you can do them again.  If a physical therapist teaches you exercises, do all of them as told.  If you lift an object, lift it with your palm facing up. This is easier on your elbow. Lifestyle  If your tennis elbow is caused by sports, check your equipment and make sure that: ? You are using it correctly. ? It fits you well.  If your tennis elbow is caused by work, take breaks often, if you are able. Talk with your manager about doing your work in a way that is safe for you. ? If your tennis elbow is caused by computer use, talk with your manager about any changes that can be made to your work setup. General instructions  If told, apply ice to the painful area: ? Put ice in a plastic bag. ? Place  a towel between your skin and the bag. ? Leave the ice on for 20 minutes, 2-3 times per day.  Take medicines only as told by your doctor.  If you were given a brace, wear it as told by your doctor.  Keep all follow-up visits as told by your doctor. This is important. Contact a doctor if:  Your pain does not get better with treatment.  Your pain gets worse.  You have weakness in your forearm, hand, or fingers.  You cannot feel your forearm, hand, or fingers. This information is not intended to replace advice given to you by your health care provider. Make sure you discuss any questions you have with your health care provider. Document Released: 11/10/2009 Document Revised: 01/21/2016 Document Reviewed: 05/19/2014 Elsevier Interactive Patient Education  2018 [employer]  Muscle Cramps and Spasms Muscle cramps and spasms are when muscles tighten by themselves. They usually get better within minutes. Muscle cramps are painful. They are usually stronger and last longer than muscle spasms. Muscle spasms may or may not be painful. They can last a few seconds or much longer. Follow these instructions at home:  Drink enough fluid to keep your pee (urine) clear or pale yellow.  Massage, stretch, and relax the muscle.  If directed, apply heat to tight or  tense muscles as often as told by your doctor. Use the heat source that your doctor recommends. ? Place a towel between your skin and the heat source. ? Leave the heat on for 20-30 minutes. ? Take off the heat if your skin turns bright red. This is especially important if you are unable to feel pain, heat, or cold. You may have a greater risk of getting burned.  If directed, put ice on the affected area. This may help if you are sore or have pain after a cramp or spasm. ? Put ice in a plastic bag. ? Place a towel between your skin and the bag. ? Leave the ice on for 20 minutes, 2-3 times a day.  Take over-the-counter and prescription  medicines only as told by your doctor.  Pay attention to any changes in your symptoms. Contact a doctor if:  Your cramps or spasms get worse or happen more often.  Your cramps or spasms do not get better with time. This information is not intended to replace advice given to you by your health care provider. Make sure you discuss any questions you have with your health care provider. Document Released: 05/05/2008 Document Revised: 06/24/2015 Document Reviewed: 02/24/2015 Elsevier Interactive Patient Education  2018 [employer].   IF you received an x-ray today, you will receive an invoice from [facility]. Please contact [facility] at [phone] with questions or concerns regarding your invoice.   IF you received labwork today, you will receive an invoice from [place]. Please contact [facility] at [phone] with questions or concerns regarding your invoice.   Our billing staff will not be able to assist you with questions regarding bills from these companies.  You will be contacted with the lab results as soon as they are available. The fastest way to get your results is to activate your My Chart account. Instructions are located on the last page of this paperwork. If you have not heard from [place] regarding the results in 2 weeks, please contact this office.

## 2017-11-14 ENCOUNTER — Encounter: Payer: Self-pay | Admitting: Family Medicine

## 2017-11-14 ENCOUNTER — Ambulatory Visit (INDEPENDENT_AMBULATORY_CARE_PROVIDER_SITE_OTHER): Payer: 59

## 2017-11-14 ENCOUNTER — Other Ambulatory Visit: Payer: Self-pay

## 2017-11-14 ENCOUNTER — Ambulatory Visit (INDEPENDENT_AMBULATORY_CARE_PROVIDER_SITE_OTHER): Payer: 59 | Admitting: Family Medicine

## 2017-11-14 ENCOUNTER — Ambulatory Visit: Payer: 59

## 2017-11-14 VITALS — BP 124/84 | HR 72 | Temp 98.4°F | Ht 70.0 in | Wt 235.4 lb

## 2017-11-14 DIAGNOSIS — M79602 Pain in left arm: Secondary | ICD-10-CM

## 2017-11-14 DIAGNOSIS — E119 Type 2 diabetes mellitus without complications: Secondary | ICD-10-CM | POA: Diagnosis not present

## 2017-11-14 DIAGNOSIS — M5412 Radiculopathy, cervical region: Secondary | ICD-10-CM | POA: Diagnosis not present

## 2017-11-14 DIAGNOSIS — M62838 Other muscle spasm: Secondary | ICD-10-CM | POA: Diagnosis not present

## 2017-11-14 LAB — GLUCOSE, POCT (MANUAL RESULT ENTRY): POC Glucose: 169 mg/dl — AB (ref 70–99)

## 2017-11-14 MED ORDER — GLIPIZIDE 5 MG PO TABS: 5.0000 mg | ORAL_TABLET | Freq: Every day | ORAL | 0 refills | Status: DC

## 2017-11-14 MED ORDER — TRAMADOL HCL 50 MG PO TABS: 50.0000 mg | ORAL_TABLET | Freq: Four times a day (QID) | ORAL | 0 refills | Status: DC | PRN

## 2017-11-14 MED ORDER — CYCLOBENZAPRINE HCL 5 MG PO TABS: ORAL_TABLET | ORAL | 0 refills | Status: DC

## 2017-11-14 MED ORDER — PREDNISONE 20 MG PO TABS: ORAL_TABLET | ORAL | 0 refills | Status: DC

## 2017-11-14 NOTE — Progress Notes (Signed)
Subjective:  By signing my name below, I, Stann Ore, attest that this documentation has been prepared under the direction and in the presence of Meredith Staggers, MD. Electronically Signed: Stann Ore, Scribe. 11/14/2017 , 9:29 AM .  Patient was seen in Room 9 .   Patient ID: Clifford Frederick, male    DOB: [birth year], 52 y.o.   MRN: [id number] Chief Complaint  Patient presents with  . left should and arm pain    started to get worse Sunday    HPI Clifford Frederick is a 52 y.o. male  Here for follow up of left shoulder and arm pain. He was last seen on May 28th, suspected paraspinal spasms and trapezius spasm; no weakness and reflexes were intact. He was initially treated with flexeril, and OTC tylenol, avoid NSAIDs given his other medications including anti-platelets medications. He was also treated for possible early tennis elbow.   Patient states flexeril causes him to be drowsy, so he's been taking it at bed time. However, 2 days ago, he felt left upper arm pain and left shoulder pain with numbness and soreness radiating down into his left fingers (primarily 2nd, 3rd and 4th primarily). He's also noticed some left sided neck pain as well. He doesn't recall taking prednisone in the past; has a history of diabetes.   He drives a forklift for work, notices more pain when sitting up.   History of diabetes Lab Results  Component Value Date   HGBA1C 8.9 (H) 08/16/2017   He is on metformin 1000 mg.   Patient Active Problem List   Diagnosis Date Noted  . Pure hypercholesterolemia   . Abnormal EKG   . Unstable angina (HCC)   . Acute coronary syndrome (HCC) 08/15/2017  . Chest pain 08/15/2017  . Tobacco abuse 08/15/2017  . Hypertensive urgency 08/15/2017  . Diabetes mellitus type 2 in obese (HCC) 08/15/2017  . Normocytic anemia 08/15/2017  . Essential hypertension, benign 08/08/2012  . Diabetes mellitus (HCC) 08/08/2012  . Fracture of lateral malleolus of left ankle 08/07/2012    Past Medical History:  Diagnosis Date  . CAD in native artery    A. LHC 08/16/17- DES to RCA  . Diabetes mellitus without complication (HCC)    but doesn't take his meds and hasn't in months  . High cholesterol   . Hypertension   . Sleep apnea    Past Surgical History:  Procedure Laterality Date  . CORONARY STENT INTERVENTION N/A 08/16/2017   Procedure: CORONARY STENT INTERVENTION;  Surgeon: Corky Crafts, MD;  Location: [facility] INVASIVE CV LAB;  Service: Cardiovascular;  Laterality: N/A;  . LEFT HEART CATH AND CORONARY ANGIOGRAPHY N/A 08/16/2017   Procedure: LEFT HEART CATH AND CORONARY ANGIOGRAPHY;  Surgeon: Corky Crafts, MD;  Location: [facility] INVASIVE CV LAB;  Service: Cardiovascular;  Laterality: N/A;  . ORIF ANKLE FRACTURE Left 08/07/2012   Procedure: OPEN REDUCTION INTERNAL FIXATION (ORIF) LEFT ANKLE FRACTURE;  Surgeon: Kathryne Hitch, MD;  Location: [facility] OR;  Service: Orthopedics;  Laterality: Left;   No Known Allergies Prior to Admission medications   Medication Sig Start Date End Date Taking? Authorizing Provider  amLODipine (NORVASC) 5 MG tablet Take 1 tablet (5 mg total) by mouth daily. 08/17/17  Yes Berton Bon, NP  aspirin 81 MG chewable tablet Chew 1 tablet (81 mg total) by mouth daily. 08/18/17  Yes Berton Bon, NP  atorvastatin (LIPITOR) 80 MG tablet Take 1 tablet (80 mg total) by mouth daily at 6 PM.  08/17/17  Yes Berton Bon, NP  carvedilol (COREG) 25 MG tablet Take 1 tablet (25 mg total) by mouth 2 (two) times daily. 08/31/17 11/29/17 Yes Berton Bon, NP  cyclobenzaprine (FLEXERIL) 5 MG tablet 1 pill by mouth up to every 8 hours as needed. Start with one pill by mouth each bedtime as needed due to sedation 10/31/17  Yes Shade Flood, MD  lisinopril (PRINIVIL,ZESTRIL) 40 MG tablet Take 1 tablet (40 mg total) by mouth daily. 08/18/17  Yes Berton Bon, NP  metFORMIN (GLUCOPHAGE) 1000 MG tablet Take 1 tablet (1,000 mg total) by mouth 2 (two)  times daily with a meal. 09/07/17 09/07/18 Yes Wallis Bamberg, PA-C  nitroGLYCERIN (NITROSTAT) 0.4 MG SL tablet Place 1 tablet (0.4 mg total) under the tongue every 5 (five) minutes as needed for chest pain. 08/17/17  Yes Berton Bon, NP  ticagrelor (BRILINTA) 90 MG TABS tablet Take 1 tablet (90 mg total) by mouth 2 (two) times daily. 08/17/17  Yes Berton Bon, NP   Social History   Socioeconomic History  . Marital status: Married    Spouse name: Not on file  . Number of children: Not on file  . Years of education: Not on file  . Highest education level: Not on file  Occupational History  . Not on file  Social Needs  . Financial resource strain: Not on file  . Food insecurity:    Worry: Not on file    Inability: Not on file  . Transportation needs:    Medical: Not on file    Non-medical: Not on file  Tobacco Use  . Smoking status: Former Smoker    Packs/day: 1.00    Types: Cigarettes    Start date: 08/16/2017  . Smokeless tobacco: Never Used  Substance and Sexual Activity  . Alcohol use: Yes    Comment: occ  . Drug use: No  . Sexual activity: Not on file  Lifestyle  . Physical activity:    Days per week: Not on file    Minutes per session: Not on file  . Stress: Not on file  Relationships  . Social connections:    Talks on phone: Not on file    Gets together: Not on file    Attends religious service: Not on file    Active member of club or organization: Not on file    Attends meetings of clubs or organizations: Not on file    Relationship status: Not on file  . Intimate partner violence:    Fear of current or ex partner: Not on file    Emotionally abused: Not on file    Physically abused: Not on file    Forced sexual activity: Not on file  Other Topics Concern  . Not on file  Social History Narrative  . Not on file   Review of Systems  Constitutional: Negative for fatigue and unexpected weight change.  Eyes: Negative for visual disturbance.  Respiratory:  Negative for cough, chest tightness and shortness of breath.   Cardiovascular: Negative for chest pain, palpitations and leg swelling.  Gastrointestinal: Negative for abdominal pain and blood in stool.  Musculoskeletal: Positive for arthralgias (left shoulder) and myalgias.  Neurological: Positive for weakness. Negative for dizziness, light-headedness and headaches.       Objective:   Physical Exam  Musculoskeletal:  Left shoulder: slight tenderness left upper trapezius with some spasm, pain free ROM of left shoulder and elbow, NVI with warm finger tips, radial pulse intact, equal grip strength  bilaterally C-spine: decreased extension, decreased left rotation with reproduction of symptoms  Neurological:  Reflex Scores:      Bicep reflexes are 2+ on the right side and 2+ on the left side.      Brachioradialis reflexes are 2+ on the right side and 2+ on the left side. Difficulty obtaining triceps DTR's bilaterally    Vitals:   11/14/17 0856  BP: 124/84  Pulse: 72  Temp: 98.4 F (36.9 C)  TempSrc: Oral  SpO2: 99%  Weight: 235 lb 6.4 oz (106.8 kg)  Height: 5\' 10"  (1.778 m)   Results for orders placed or performed in visit on 11/14/17  POCT glucose (manual entry)  Result Value Ref Range   POC Glucose 169 (A) 70 - 99 mg/dl   C-spine x-ray reviewed with straightening of normal cervical lordosis and degenerative disc disease with endplate spurring W1-1, C6-7.  Well-preserved disc spaces and vertebral body heights without fracture dislocations identified.    Assessment & Plan:    Clifford Frederick is a 52 y.o. male Left cervical radiculopathy - Plan: predniSONE (DELTASONE) 20 MG tablet, traMADol (ULTRAM) 50 MG tablet, DG Cervical Spine Complete, CANCELED: DG Cervical Spine Complete  Type 2 diabetes mellitus without complication, without long-term current use of insulin (HCC) - Plan: POCT glucose (manual entry), glipiZIDE (GLUCOTROL) 5 MG tablet  Muscle spasm - Plan: cyclobenzaprine  (FLEXERIL) 5 MG tablet, DG Cervical Spine Complete  Arm pain, left - Plan: traMADol (ULTRAM) 50 MG tablet, DG Cervical Spine Complete  Still may be cervical radiculopathy, secondary to spasm versus degenerative disc disease.  Will try prednisone taper, okay to continue Flexeril and tramadol was ordered as well as needed for pain relief.  Cautioned on combination of tramadol and Flexeril or other sedating medications.  Due to diabetes will  cover with glipizide while he is taking prednisone.  Potential side effects of these medications were discussed.  Recheck in 10 to 14 days, sooner if worse.  Meds ordered this encounter  Medications  . predniSONE (DELTASONE) 20 MG tablet    Sig: 3 by mouth for 3 days, then 2 by mouth for 2 days, then 1 by mouth for 2 days, then 1/2 by mouth for 2 days.    Dispense:  16 tablet    Refill:  0  . glipiZIDE (GLUCOTROL) 5 MG tablet    Sig: Take 1 tablet (5 mg total) by mouth daily before breakfast.    Dispense:  10 tablet    Refill:  0  . cyclobenzaprine (FLEXERIL) 5 MG tablet    Sig: 1 pill by mouth up to every 8 hours as needed. Start with one pill by mouth each bedtime as needed due to sedation    Dispense:  15 tablet    Refill:  0  . traMADol (ULTRAM) 50 MG tablet    Sig: Take 1 tablet (50 mg total) by mouth every 6 (six) hours as needed.    Dispense:  20 tablet    Refill:  0   Patient Instructions    Pain in your arm appears to be due to pinched nerve from the neck. Tramadol if needed short term for pain, but be careful combining that with sedating meds like flexeril.   Start prednisone 3 pills per day initially. Monitor your blood sugar on that medication, but I also wrote for a short-term supply of a medicine called glipizide.  Take that with first large meal of the day.  That should help control some of  the elevation in blood sugar while you are taking prednisone.  If you are getting readings over 200, let me know and we can adjust medication further  in the short-term.  Follow-up in the next 10 to 14 days if neck and arm pain is not significantly improved, sooner if worse.   Cervical Radiculopathy Cervical radiculopathy happens when a nerve in the neck (cervical nerve) is pinched or bruised. This condition can develop because of an injury or as part of the normal aging process. Pressure on the cervical nerves can cause pain or numbness that runs from the neck all the way down into the arm and fingers. Usually, this condition gets better with rest. Treatment may be needed if the condition does not improve. What are the causes? This condition may be caused by:  Injury.  Slipped (herniated) disk.  Muscle tightness in the neck because of overuse.  Arthritis.  Breakdown or degeneration in the bones and joints of the spine (spondylosis) due to aging.  Bone spurs that may develop near the cervical nerves.  What are the signs or symptoms? Symptoms of this condition include:  Pain that runs from the neck to the arm and hand. The pain can be severe or irritating. It may be worse when the neck is moved.  Numbness or weakness in the affected arm and hand.  How is this diagnosed? This condition may be diagnosed based on symptoms, medical history, and a physical exam. You may also have tests, including:  X-rays.  CT scan.  MRI.  Electromyogram (EMG).  Nerve conduction tests.  How is this treated? In many cases, treatment is not needed for this condition. With rest, the condition usually gets better over time. If treatment is needed, options may include:  Wearing a soft neck collar for short periods of time.  Physical therapy to strengthen your neck muscles.  Medicines, such as NSAIDs, oral corticosteroids, or spinal injections.  Surgery. This may be needed if other treatments do not help. Various types of surgery may be done depending on the cause of your problems.  Follow these instructions at home: Managing pain  Take  over-the-counter and prescription medicines only as told by your health care provider.  If directed, apply ice to the affected area. ? Put ice in a plastic bag. ? Place a towel between your skin and the bag. ? Leave the ice on for 20 minutes, 2-3 times per day.  If ice does not help, you can try using heat. Take a warm shower or warm bath, or use a heat pack as told by your health care provider.  Try a gentle neck and shoulder massage to help relieve symptoms. Activity  Rest as needed. Follow instructions from your health care provider about any restrictions on activities.  Do stretching and strengthening exercises as told by your health care provider or physical therapist. General instructions  If you were given a soft collar, wear it as told by your health care provider.  Use a flat pillow when you sleep.  Keep all follow-up visits as told by your health care provider. This is important. Contact a health care provider if:  Your condition does not improve with treatment. Get help right away if:  Your pain gets much worse and cannot be controlled with medicines.  You have weakness or numbness in your hand, arm, face, or leg.  You have a high fever.  You have a stiff, rigid neck.  You lose control of your bowels or your  bladder (have incontinence).  You have trouble with walking, balance, or speaking. This information is not intended to replace advice given to you by your health care provider. Make sure you discuss any questions you have with your health care provider. Document Released: 02/15/2001 Document Revised: 10/29/2015 Document Reviewed: 07/17/2014 Elsevier Interactive Patient Education  2018 [employer]   IF you received an x-ray today, you will receive an invoice from [facility]. Please contact [facility] at [phone] with questions or concerns regarding your invoice.   IF you received labwork today, you will receive an invoice from  [facility]. Please contact [facility] at [phone] with questions or concerns regarding your invoice.   Our billing staff will not be able to assist you with questions regarding bills from these companies.  You will be contacted with the lab results as soon as they are available. The fastest way to get your results is to activate your My Chart account. Instructions are located on the last page of this paperwork. If you have not heard from us regarding the results in 2 weeks, please contact this office.       I personally performed the services described in this documentation, which was scribed in my presence. The recorded information has been reviewed and considered for accuracy and completeness, addended by me as needed, and agree with information above.  Signed,   Meredith StaggersJeffrey Khyler Urda, MD [facility] at [facility].  11/18/17 10:42 PM

## 2017-11-14 NOTE — Patient Instructions (Addendum)
Pain in your arm appears to be due to pinched nerve from the neck. Tramadol if needed short term for pain, but be careful combining that with sedating meds like flexeril.   Start prednisone 3 pills per day initially. Monitor your blood sugar on that medication, but I also wrote for a short-term supply of a medicine called glipizide.  Take that with first large meal of the day.  That should help control some of the elevation in blood sugar while you are taking prednisone.  If you are getting readings over 200, let me know and we can adjust medication further in the short-term.  Follow-up in the next 10 to 14 days if neck and arm pain is not significantly improved, sooner if worse.   Cervical Radiculopathy Cervical radiculopathy happens when a nerve in the neck (cervical nerve) is pinched or bruised. This condition can develop because of an injury or as part of the normal aging process. Pressure on the cervical nerves can cause pain or numbness that runs from the neck all the way down into the arm and fingers. Usually, this condition gets better with rest. Treatment may be needed if the condition does not improve. What are the causes? This condition may be caused by:  Injury.  Slipped (herniated) disk.  Muscle tightness in the neck because of overuse.  Arthritis.  Breakdown or degeneration in the bones and joints of the spine (spondylosis) due to aging.  Bone spurs that may develop near the cervical nerves.  What are the signs or symptoms? Symptoms of this condition include:  Pain that runs from the neck to the arm and hand. The pain can be severe or irritating. It may be worse when the neck is moved.  Numbness or weakness in the affected arm and hand.  How is this diagnosed? This condition may be diagnosed based on symptoms, medical history, and a physical exam. You may also have tests, including:  X-rays.  CT scan.  MRI.  Electromyogram (EMG).  Nerve conduction tests.  How  is this treated? In many cases, treatment is not needed for this condition. With rest, the condition usually gets better over time. If treatment is needed, options may include:  Wearing a soft neck collar for short periods of time.  Physical therapy to strengthen your neck muscles.  Medicines, such as NSAIDs, oral corticosteroids, or spinal injections.  Surgery. This may be needed if other treatments do not help. Various types of surgery may be done depending on the cause of your problems.  Follow these instructions at home: Managing pain  Take over-the-counter and prescription medicines only as told by your health care provider.  If directed, apply ice to the affected area. ? Put ice in a plastic bag. ? Place a towel between your skin and the bag. ? Leave the ice on for 20 minutes, 2-3 times per day.  If ice does not help, you can try using heat. Take a warm shower or warm bath, or use a heat pack as told by your health care provider.  Try a gentle neck and shoulder massage to help relieve symptoms. Activity  Rest as needed. Follow instructions from your health care provider about any restrictions on activities.  Do stretching and strengthening exercises as told by your health care provider or physical therapist. General instructions  If you were given a soft collar, wear it as told by your health care provider.  Use a flat pillow when you sleep.  Keep all follow-up visits  as told by your health care provider. This is important. Contact a health care provider if:  Your condition does not improve with treatment. Get help right away if:  Your pain gets much worse and cannot be controlled with medicines.  You have weakness or numbness in your hand, arm, face, or leg.  You have a high fever.  You have a stiff, rigid neck.  You lose control of your bowels or your bladder (have incontinence).  You have trouble with walking, balance, or speaking. This information is not  intended to replace advice given to you by your health care provider. Make sure you discuss any questions you have with your health care provider. Document Released: 02/15/2001 Document Revised: 10/29/2015 Document Reviewed: 07/17/2014 Elsevier Interactive Patient Education  2018 [employer]   IF you received an x-ray today, you will receive an invoice from [facility]. Please contact [facility] at [phone] with questions or concerns regarding your invoice.   IF you received labwork today, you will receive an invoice from [facility]. Please contact [facility] at [phone] with questions or concerns regarding your invoice.   Our billing staff will not be able to assist you with questions regarding bills from these companies.  You will be contacted with the lab results as soon as they are available. The fastest way to get your results is to activate your My Chart account. Instructions are located on the last page of this paperwork. If you have not heard from us regarding the results in 2 weeks, please contact this office.

## 2017-11-28 ENCOUNTER — Ambulatory Visit (INDEPENDENT_AMBULATORY_CARE_PROVIDER_SITE_OTHER): Payer: 59 | Admitting: Family Medicine

## 2017-11-28 ENCOUNTER — Encounter: Payer: Self-pay | Admitting: Family Medicine

## 2017-11-28 VITALS — BP 130/83 | HR 102 | Temp 99.3°F | Resp 17 | Ht 70.0 in | Wt 239.0 lb

## 2017-11-28 DIAGNOSIS — E119 Type 2 diabetes mellitus without complications: Secondary | ICD-10-CM

## 2017-11-28 DIAGNOSIS — M5412 Radiculopathy, cervical region: Secondary | ICD-10-CM

## 2017-11-28 NOTE — Patient Instructions (Addendum)
I'm glad to hear that the arm pain has improved. You may have had some pinched nerve in the neck that resolve with prednisone. No new medicines at this time, but can try some gentle streches of neck and range of motion for now. Return to the clinic or go to the nearest emergency room if any of your symptoms worsen or new symptoms occur. If neck or arm pain worsen again, I would recommend meeting with an orthopaedist.   For diabetes, ok to continue glipizide until follow up with Gurney MaxinMike Mani next week and can decide on other meds at that time. Thanks for coming in today.   Neck Exercises Neck exercises can be important for many reasons:  They can help you to improve and maintain flexibility in your neck. This can be especially important as you age.  They can help to make your neck stronger. This can make movement easier.  They can reduce or prevent neck pain.  They may help your upper back.  Ask your health care provider which neck exercises would be best for you. Exercises Neck Press Repeat this exercise 10 times. Do it first thing in the morning and right before bed or as told by your health care provider. 1. Lie on your back on a firm bed or on the floor with a pillow under your head. 2. Use your neck muscles to push your head down on the pillow and straighten your spine. 3. Hold the position as well as you can. Keep your head facing up and your chin tucked. 4. Slowly count to 5 while holding this position. 5. Relax for a few seconds. Then repeat.  Isometric Strengthening Do a full set of these exercises 2 times a day or as told by your health care provider. 1. Sit in a supportive chair and place your hand on your forehead. 2. Push forward with your head and neck while pushing back with your hand. Hold for 10 seconds. 3. Relax. Then repeat the exercise 3 times. 4. Next, do thesequence again, this time putting your hand against the back of your head. Use your head and neck to push  backward against the hand pressure. 5. Finally, do the same exercise on either side of your head, pushing sideways against the pressure of your hand.  Prone Head Lifts Repeat this exercise 5 times. Do this 2 times a day or as told by your health care provider. 1. Lie face-down, resting on your elbows so that your chest and upper back are raised. 2. Start with your head facing downward, near your chest. Position your chin either on or near your chest. 3. Slowly lift your head upward. Lift until you are looking straight ahead. Then continue lifting your head as far back as you can stretch. 4. Hold your head up for 5 seconds. Then slowly lower it to your starting position.  Supine Head Lifts Repeat this exercise 8-10 times. Do this 2 times a day or as told by your health care provider. 1. Lie on your back, bending your knees to point to the ceiling and keeping your feet flat on the floor. 2. Lift your head slowly off the floor, raising your chin toward your chest. 3. Hold for 5 seconds. 4. Relax and repeat.  Scapular Retraction Repeat this exercise 5 times. Do this 2 times a day or as told by your health care provider. 1. Stand with your arms at your sides. Look straight ahead. 2. Slowly pull both shoulders backward and downward  until you feel a stretch between your shoulder blades in your upper back. 3. Hold for 10-30 seconds. 4. Relax and repeat.  Contact a health care provider if:  Your neck pain or discomfort gets much worse when you do an exercise.  Your neck pain or discomfort does not improve within 2 hours after you exercise. If you have any of these problems, stop exercising right away. Do not do the exercises again unless your health care provider says that you can. Get help right away if:  You develop sudden, severe neck pain. If this happens, stop exercising right away. Do not do the exercises again unless your health care provider says that you can. Exercises Neck  Stretch  Repeat this exercise 3-5 times. 1. Do this exercise while standing or while sitting in a chair. 2. Place your feet flat on the floor, shoulder-width apart. 3. Slowly turn your head to the right. Turn it all the way to the right so you can look over your right shoulder. Do not tilt or tip your head. 4. Hold this position for 10-30 seconds. 5. Slowly turn your head to the left, to look over your left shoulder. 6. Hold this position for 10-30 seconds.  Neck Retraction Repeat this exercise 8-10 times. Do this 3-4 times a day or as told by your health care provider. 1. Do this exercise while standing or while sitting in a sturdy chair. 2. Look straight ahead. Do not bend your neck. 3. Use your fingers to push your chin backward. Do not bend your neck for this movement. Continue to face straight ahead. If you are doing the exercise properly, you will feel a slight sensation in your throat and a stretch at the back of your neck. 4. Hold the stretch for 1-2 seconds. Relax and repeat.  This information is not intended to replace advice given to you by your health care provider. Make sure you discuss any questions you have with your health care provider. Document Released: 05/04/2015 Document Revised: 10/29/2015 Document Reviewed: 12/01/2014 Elsevier Interactive Patient Education  2018 [employer].   IF you received an x-ray today, you will receive an invoice from [facility]. Please contact [facility] at [phone] with questions or concerns regarding your invoice.   IF you received labwork today, you will receive an invoice from [facility]. Please contact [facility] at [phone] with questions or concerns regarding your invoice.   Our billing staff will not be able to assist you with questions regarding bills from these companies.  You will be contacted with the lab results as soon as they are available. The fastest way to get your results is to activate your My  Chart account. Instructions are located on the last page of this paperwork. If you have not heard from [place] regarding the results in 2 weeks, please contact this office.

## 2017-11-28 NOTE — Progress Notes (Signed)
Subjective:  By signing my name below, I, Stann Ore, attest that this documentation has been prepared under the direction and in the presence of Meredith Staggers, MD. Electronically Signed: Stann Ore, Scribe. 11/28/2017 , 2:49 PM .  Patient was seen in Room 2 .   Patient ID: Clifford Frederick, male    DOB: Dec 14, 1965, 52 y.o.   MRN: [id number] Chief Complaint  Patient presents with  . Follow-up    elbow, arm and neck pain    HPI Clifford Frederick is a 52 y.o. male Here for follow up. He was last seen on June 11th for left shoulder and arm pain follow up, previously seen on May 28th. Suspected paraspinal and trapezius spasms. Initially, he was treated with Flexeril and tylenol (avoided NSAIDs with his other medications). He also had possible early lateral epicondylitis. He had been improving but then numbness and pain radiated to his left hand middle fingers with left sided neck pain. He had a cervical spine xray which showed straightening and lordosis, DDD C5-6 and C6-7 but preserved disc spaces and vertebral body heights. He was treated with prednisone taper with tramadol and Flexeril as needed. He ws also started on glipizide 5 mg for diabetes while taking prednisone as glucose 169 in office and A1C of 8.9 in March.   Patient states his left arm pain improved after taking prednisone for 2-3 days. 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 He notices still having neck pain if he sits down for an extended period of time, which is improved with walking. Overall, he's feeling better. He hasn't been taking pain medications. He's been doing home stretches.   He's still taking the glipizide for his diabetes. He's been checking his blood sugar at home, denies hypoglycemia. His next appointment with his PCP, Wallis Bamberg PA-C, will be on July 5th.   Patient Active Problem List   Diagnosis Date Noted  . Pure  hypercholesterolemia   . Abnormal EKG   . Unstable angina (HCC)   . Acute coronary syndrome (HCC) 08/15/2017  . Chest pain 08/15/2017  . Tobacco abuse 08/15/2017  . Hypertensive urgency 08/15/2017  . Diabetes mellitus type 2 in obese (HCC) 08/15/2017  . Normocytic anemia 08/15/2017  . Essential hypertension, benign 08/08/2012  . Diabetes mellitus (HCC) 08/08/2012  . Fracture of lateral malleolus of left ankle 08/07/2012   Past Medical History:  Diagnosis Date  . CAD in native artery    A. LHC 08/16/17- DES to RCA  . Diabetes mellitus without complication (HCC)    but doesn't take his meds and hasn't in months  . High cholesterol   . Hypertension   . Sleep apnea    Past Surgical History:  Procedure Laterality Date  . CORONARY STENT INTERVENTION N/A 08/16/2017   Procedure: CORONARY STENT INTERVENTION;  Surgeon: Corky Crafts, MD;  Location: [facility] INVASIVE CV LAB;  Service: Cardiovascular;  Laterality: N/A;  . LEFT HEART CATH AND CORONARY ANGIOGRAPHY N/A 08/16/2017   Procedure: LEFT HEART CATH AND CORONARY ANGIOGRAPHY;  Surgeon: Corky Crafts, MD;  Location: [facility] INVASIVE CV LAB;  Service: Cardiovascular;  Laterality: N/A;  . ORIF ANKLE FRACTURE Left 08/07/2012   Procedure: OPEN REDUCTION INTERNAL FIXATION (ORIF) LEFT ANKLE FRACTURE;  Surgeon: Kathryne Hitch, MD;  Location: [facility] OR;  Service: Orthopedics;  Laterality: Left;   No Known Allergies Prior to Admission medications   Medication Sig Start Date  End Date Taking? Authorizing Provider  amLODipine (NORVASC) 5 MG tablet Take 1 tablet (5 mg total) by mouth daily. 08/17/17   Berton Bon, NP  aspirin 81 MG chewable tablet Chew 1 tablet (81 mg total) by mouth daily. 08/18/17   Berton Bon, NP  atorvastatin (LIPITOR) 80 MG tablet Take 1 tablet (80 mg total) by mouth daily at 6 PM. 08/17/17   Berton Bon, NP  carvedilol (COREG) 25 MG tablet Take 1 tablet (25 mg total) by mouth 2 (two) times daily. 08/31/17 11/29/17   Berton Bon, NP  cyclobenzaprine (FLEXERIL) 5 MG tablet 1 pill by mouth up to every 8 hours as needed. Start with one pill by mouth each bedtime as needed due to sedation 11/14/17   Shade Flood, MD  glipiZIDE (GLUCOTROL) 5 MG tablet Take 1 tablet (5 mg total) by mouth daily before breakfast. 11/14/17   Shade Flood, MD  lisinopril (PRINIVIL,ZESTRIL) 40 MG tablet Take 1 tablet (40 mg total) by mouth daily. 08/18/17   Berton Bon, NP  metFORMIN (GLUCOPHAGE) 1000 MG tablet Take 1 tablet (1,000 mg total) by mouth 2 (two) times daily with a meal. 09/07/17 09/07/18  Wallis Bamberg, PA-C  nitroGLYCERIN (NITROSTAT) 0.4 MG SL tablet Place 1 tablet (0.4 mg total) under the tongue every 5 (five) minutes as needed for chest pain. 08/17/17   Berton Bon, NP  predniSONE (DELTASONE) 20 MG tablet 3 by mouth for 3 days, then 2 by mouth for 2 days, then 1 by mouth for 2 days, then 1/2 by mouth for 2 days. 11/14/17   Shade Flood, MD  ticagrelor (BRILINTA) 90 MG TABS tablet Take 1 tablet (90 mg total) by mouth 2 (two) times daily. 08/17/17   Berton Bon, NP  traMADol (ULTRAM) 50 MG tablet Take 1 tablet (50 mg total) by mouth every 6 (six) hours as needed. 11/14/17   Shade Flood, MD   Social History   Socioeconomic History  . Marital status: Married    Spouse name: Not on file  . Number of children: Not on file  . Years of education: Not on file  . Highest education level: Not on file  Occupational History  . Not on file  Social Needs  . Financial resource strain: Not on file  . Food insecurity:    Worry: Not on file    Inability: Not on file  . Transportation needs:    Medical: Not on file    Non-medical: Not on file  Tobacco Use  . Smoking status: Former Smoker    Packs/day: 1.00    Types: Cigarettes    Start date: 08/16/2017  . Smokeless tobacco: Never Used  Substance and Sexual Activity  . Alcohol use: Yes    Comment: occ  . Drug use: No  . Sexual activity: Not on file    Lifestyle  . Physical activity:    Days per week: Not on file    Minutes per session: Not on file  . Stress: Not on file  Relationships  . Social connections:    Talks on phone: Not on file    Gets together: Not on file    Attends religious service: Not on file    Active member of club or organization: Not on file    Attends meetings of clubs or organizations: Not on file    Relationship status: Not on file  . Intimate partner violence:    Fear of current or ex partner: Not on file  Emotionally abused: Not on file    Physically abused: Not on file    Forced sexual activity: Not on file  Other Topics Concern  . Not on file  Social History Narrative  . Not on file   Review of Systems  Constitutional: Negative for fatigue and unexpected weight change.  Eyes: Negative for visual disturbance.  Respiratory: Negative for cough, chest tightness and shortness of breath.   Cardiovascular: Negative for chest pain, palpitations and leg swelling.  Gastrointestinal: Negative for abdominal pain and blood in stool.  Musculoskeletal: Negative for arthralgias, myalgias, neck pain and neck stiffness.  Neurological: Negative for dizziness, light-headedness and headaches.       Objective:   Physical Exam  Constitutional: He is oriented to person, place, and time. He appears well-developed and well-nourished. No distress.  HENT:  Head: Normocephalic and atraumatic.  Eyes: Pupils are equal, round, and reactive to light. EOM are normal.  Neck: Neck supple.  Cardiovascular: Normal rate.  Pulmonary/Chest: Effort normal. No respiratory distress.  Musculoskeletal: Normal range of motion.  C-spine: minimal discomfort with left rotation but ROM intact Elbows non tender, pain free ROM; grip strength intact, arm strength intact bilaterally  Neurological: He is alert and oriented to person, place, and time.  Skin: Skin is warm and dry.  Psychiatric: He has a normal mood and affect. His behavior is  normal.  Nursing note and vitals reviewed.   Vitals:   11/28/17 1401  BP: 130/83  Pulse: (!) 102  Resp: 17  Temp: 99.3 F (37.4 C)  TempSrc: Oral  SpO2: 98%  Weight: 239 lb (108.4 kg)  Height: 5\' 10"  (1.778 m)       Assessment & Plan:   Clifford LeachSteve L Frederick is a 52 y.o. male Left cervical radiculopathy  -Left-sided neck pain, arm pain with dysesthesias into hands from last visit, now improved/nearly resolved after prednisone.  Suspected cervical radiculopathy versus brachial plexopathy with spasm.  Overall reassuring cervical imaging last visit.  Decided against advanced imaging with MRI at this time as significantly improved  -Recommended continue symptomatic care with range of motion, gentle stretches throughout the day as tolerated.  If symptoms recur would recommend orthopedic evaluation.  Type 2 diabetes mellitus without complication, without long-term current use of insulin (HCC)  -Plan follow-up next week with primary care provider.  Now on glipizide for improved control, especially with recent prednisone dosing.  Denies hypoglycemia.  No orders of the defined types were placed in this encounter.  Patient Instructions   I'm glad to hear that the arm pain has improved. You may have had some pinched nerve in the neck that resolve with prednisone. No new medicines at this time, but can try some gentle streches of neck and range of motion for now. Return to the clinic or go to the nearest emergency room if any of your symptoms worsen or new symptoms occur. If neck or arm pain worsen again, I would recommend meeting with an orthopaedist.   For diabetes, ok to continue glipizide until follow up with Gurney MaxinMike Mani next week and can decide on other meds at that time. Thanks for coming in today.   Neck Exercises Neck exercises can be important for many reasons:  They can help you to improve and maintain flexibility in your neck. This can be especially important as you age.  They can help  to make your neck stronger. This can make movement easier.  They can reduce or prevent neck pain.  They may help  your upper back.  Ask your health care provider which neck exercises would be best for you. Exercises Neck Press Repeat this exercise 10 times. Do it first thing in the morning and right before bed or as told by your health care provider. 1. Lie on your back on a firm bed or on the floor with a pillow under your head. 2. Use your neck muscles to push your head down on the pillow and straighten your spine. 3. Hold the position as well as you can. Keep your head facing up and your chin tucked. 4. Slowly count to 5 while holding this position. 5. Relax for a few seconds. Then repeat.  Isometric Strengthening Do a full set of these exercises 2 times a day or as told by your health care provider. 1. Sit in a supportive chair and place your hand on your forehead. 2. Push forward with your head and neck while pushing back with your hand. Hold for 10 seconds. 3. Relax. Then repeat the exercise 3 times. 4. Next, do thesequence again, this time putting your hand against the back of your head. Use your head and neck to push backward against the hand pressure. 5. Finally, do the same exercise on either side of your head, pushing sideways against the pressure of your hand.  Prone Head Lifts Repeat this exercise 5 times. Do this 2 times a day or as told by your health care provider. 1. Lie face-down, resting on your elbows so that your chest and upper back are raised. 2. Start with your head facing downward, near your chest. Position your chin either on or near your chest. 3. Slowly lift your head upward. Lift until you are looking straight ahead. Then continue lifting your head as far back as you can stretch. 4. Hold your head up for 5 seconds. Then slowly lower it to your starting position.  Supine Head Lifts Repeat this exercise 8-10 times. Do this 2 times a day or as told by your  health care provider. 1. Lie on your back, bending your knees to point to the ceiling and keeping your feet flat on the floor. 2. Lift your head slowly off the floor, raising your chin toward your chest. 3. Hold for 5 seconds. 4. Relax and repeat.  Scapular Retraction Repeat this exercise 5 times. Do this 2 times a day or as told by your health care provider. 1. Stand with your arms at your sides. Look straight ahead. 2. Slowly pull both shoulders backward and downward until you feel a stretch between your shoulder blades in your upper back. 3. Hold for 10-30 seconds. 4. Relax and repeat.  Contact a health care provider if:  Your neck pain or discomfort gets much worse when you do an exercise.  Your neck pain or discomfort does not improve within 2 hours after you exercise. If you have any of these problems, stop exercising right away. Do not do the exercises again unless your health care provider says that you can. Get help right away if:  You develop sudden, severe neck pain. If this happens, stop exercising right away. Do not do the exercises again unless your health care provider says that you can. Exercises Neck Stretch  Repeat this exercise 3-5 times. 1. Do this exercise while standing or while sitting in a chair. 2. Place your feet flat on the floor, shoulder-width apart. 3. Slowly turn your head to the right. Turn it all the way to the right so you can  look over your right shoulder. Do not tilt or tip your head. 4. Hold this position for 10-30 seconds. 5. Slowly turn your head to the left, to look over your left shoulder. 6. Hold this position for 10-30 seconds.  Neck Retraction Repeat this exercise 8-10 times. Do this 3-4 times a day or as told by your health care provider. 1. Do this exercise while standing or while sitting in a sturdy chair. 2. Look straight ahead. Do not bend your neck. 3. Use your fingers to push your chin backward. Do not bend your neck for this  movement. Continue to face straight ahead. If you are doing the exercise properly, you will feel a slight sensation in your throat and a stretch at the back of your neck. 4. Hold the stretch for 1-2 seconds. Relax and repeat.  This information is not intended to replace advice given to you by your health care provider. Make sure you discuss any questions you have with your health care provider. Document Released: 05/04/2015 Document Revised: 10/29/2015 Document Reviewed: 12/01/2014 Elsevier Interactive Patient Education  2018 [employer].   IF you received an x-ray today, you will receive an invoice from [facility]. Please contact [facility] at [phone] with questions or concerns regarding your invoice.   IF you received labwork today, you will receive an invoice from [place]. Please contact [facility] at [phone] with questions or concerns regarding your invoice.   Our billing staff will not be able to assist you with questions regarding bills from these companies.  You will be contacted with the lab results as soon as they are available. The fastest way to get your results is to activate your My Chart account. Instructions are located on the last page of this paperwork. If you have not heard from [place] regarding the results in 2 weeks, please contact this office.         I personally performed the services described in this documentation, which was scribed in my presence. The recorded information has been reviewed and considered for accuracy and completeness, addended by me as needed, and agree with information above.  Signed,   Meredith Staggers, MD [facility] at [facility].  11/28/17 2:53 PM

## 2017-12-08 ENCOUNTER — Ambulatory Visit: Payer: 59 | Admitting: Urgent Care

## 2017-12-11 ENCOUNTER — Ambulatory Visit: Payer: 59 | Admitting: Cardiology

## 2017-12-11 DIAGNOSIS — R0989 Other specified symptoms and signs involving the circulatory and respiratory systems: Secondary | ICD-10-CM

## 2017-12-11 NOTE — Progress Notes (Deleted)
Cardiology Office Note:    Date:  12/11/2017   ID:  Clifford Frederick Rome, DOB 1966-03-05, MRN [id number]  PCP:  Clifford BambergMani, Mario, PA-C  Cardiologist:  [name], MD    Referring MD: Clifford BonHammond, Janine, NP   No chief complaint on file.   History of Present Illness:    Clifford Frederick General is a 52 y.o. male with a hx of ***  Past Medical History:  Diagnosis Date  . Abnormal EKG   . Acute coronary syndrome (HCC) 08/15/2017  . CAD in native artery    A. LHC 08/16/17- DES to RCA  . Chest pain 08/15/2017  . Diabetes mellitus (HCC) 08/08/2012  . Diabetes mellitus type 2 in obese (HCC) 08/15/2017  . Diabetes mellitus without complication (HCC)    but doesn't take his meds and hasn't in months  . Essential hypertension, benign 08/08/2012  . Fracture of lateral malleolus of left ankle 08/07/2012  . High cholesterol   . Hypertension   . Hypertensive urgency 08/15/2017  . Pure hypercholesterolemia   . Sleep apnea   . Tobacco abuse 08/15/2017  . Unstable angina [facility](HCC)     Past Surgical History:  Procedure Laterality Date  . CORONARY STENT INTERVENTION N/A 08/16/2017   Procedure: CORONARY STENT INTERVENTION;  Surgeon: Corky CraftsVaranasi, Jayadeep S, MD;  [facility];  Service: Cardiovascular;  Laterality: N/A;  . LEFT HEART CATH AND CORONARY ANGIOGRAPHY N/A 08/16/2017   Procedure: LEFT HEART CATH AND CORONARY ANGIOGRAPHY;  Surgeon: Corky CraftsVaranasi, Jayadeep S, MD;  [facility];  Service: Cardiovascular;  Laterality: N/A;  . ORIF ANKLE FRACTURE Left 08/07/2012   Procedure: OPEN REDUCTION INTERNAL FIXATION (ORIF) LEFT ANKLE FRACTURE;  Surgeon: Clifford Hitchhristopher Y Blackman, MD;  Location: [facility] OR;  Service: Orthopedics;  Laterality: Left;    Current Medications: No outpatient medications have been marked as taking for the 12/11/17 encounter (Appointment) with Clifford Reicherturner, Traci R, MD.     Allergies:   Patient has no known allergies.   Social History   Socioeconomic History  . Marital status: Married    Spouse  name: Not on file  . Number of children: Not on file  . Years of education: Not on file  . Highest education level: Not on file  Occupational History  . Not on file  Social Needs  . Financial resource strain: Not on file  . Food insecurity:    Worry: Not on file    Inability: Not on file  . Transportation needs:    Medical: Not on file    Non-medical: Not on file  Tobacco Use  . Smoking status: Former Smoker    Packs/day: 1.00    Types: Cigarettes    Start date: 08/16/2017  . Smokeless tobacco: Never Used  Substance and Sexual Activity  . Alcohol use: Yes    Comment: occ  . Drug use: No  . Sexual activity: Not on file  Lifestyle  . Physical activity:    Days per week: Not on file    Minutes per session: Not on file  . Stress: Not on file  Relationships  . Social connections:    Talks on phone: Not on file    Gets together: Not on file    Attends religious service: Not on file    Active member of club or organization: Not on file    Attends meetings of clubs or organizations: Not on file    Relationship status: Not on file  Other Topics Concern  .  Not on file  Social History Narrative  . Not on file     Family History: The patient's ***family history includes Diabetes in his brother and mother; Hypertension in his brother and mother.  ROS:   Please see the history of present illness.    ROS  All other systems reviewed and negative.   EKGs/Labs/Other Studies Reviewed:    The following studies were reviewed today: ***  EKG:  EKG is *** ordered today.  The ekg ordered today demonstrates ***  Recent Labs: 08/17/2017: BUN 11; Creatinine, Ser 1.14; Hemoglobin 13.4; Platelets 194; Potassium 3.9; Sodium 137 10/19/2017: ALT 33   Recent Lipid Panel    Component Value Date/Time   CHOL 119 10/19/2017 0802   TRIG 84 10/19/2017 0802   HDL 41 10/19/2017 0802   CHOLHDL 2.9 10/19/2017 0802   CHOLHDL 5.5 08/16/2017 0344   VLDL 57 (H) 08/16/2017 0344   LDLCALC 61  10/19/2017 0802    Physical Exam:    VS:  There were no vitals taken for this visit.    Wt Readings from Last 3 Encounters:  11/28/17 239 lb (108.4 kg)  11/14/17 235 lb 6.4 oz (106.8 kg)  10/31/17 239 lb 3.2 oz (108.5 kg)     GEN: *** Well nourished, well developed in no acute distress HEENT: Normal NECK: No JVD; No carotid bruits LYMPHATICS: No lymphadenopathy CARDIAC: ***RRR, no murmurs, rubs, gallops RESPIRATORY:  Clear to auscultation without rales, wheezing or rhonchi  ABDOMEN: Soft, non-tender, non-distended MUSCULOSKELETAL:  No edema; No deformity  SKIN: Warm and dry NEUROLOGIC:  Alert and oriented x 3 PSYCHIATRIC:  Normal affect   ASSESSMENT:    No diagnosis found. PLAN:    In order of problems listed above:  ***   Medication Adjustments/Labs and Tests Ordered: Current medicines are reviewed at length with the patient today.  Concerns regarding medicines are outlined above.  No orders of the defined types were placed in this encounter.  No orders of the defined types were placed in this encounter.   Signed, Armanda Magic, MD  12/11/2017 8:52 AM    [facility]

## 2018-03-07 ENCOUNTER — Encounter: Payer: Self-pay | Admitting: Family Medicine

## 2018-03-07 ENCOUNTER — Ambulatory Visit (INDEPENDENT_AMBULATORY_CARE_PROVIDER_SITE_OTHER): Payer: 59 | Admitting: Family Medicine

## 2018-03-07 ENCOUNTER — Other Ambulatory Visit: Payer: Self-pay

## 2018-03-07 VITALS — BP 158/94 | HR 84 | Temp 98.3°F | Resp 18 | Ht 70.0 in | Wt 235.8 lb

## 2018-03-07 DIAGNOSIS — Z23 Encounter for immunization: Secondary | ICD-10-CM | POA: Diagnosis not present

## 2018-03-07 DIAGNOSIS — M25571 Pain in right ankle and joints of right foot: Secondary | ICD-10-CM

## 2018-03-07 DIAGNOSIS — E1165 Type 2 diabetes mellitus with hyperglycemia: Secondary | ICD-10-CM

## 2018-03-07 DIAGNOSIS — L84 Corns and callosities: Secondary | ICD-10-CM | POA: Diagnosis not present

## 2018-03-07 NOTE — Progress Notes (Signed)
Subjective:  By signing my name below, I, Clifford Frederick, attest that this documentation has been prepared under the direction and in the presence of Clifford Staggers, MD. Electronically Signed: Stann Frederick, Scribe. 03/07/2018 , 10:27 AM .  Patient was seen in Room 2 .   Patient ID: Clifford Frederick, male    DOB: 02/21/1966, 52 y.o.   MRN: [id number] Chief Complaint  Patient presents with  . Foot Pain    dried skin that hurts on right foot wears steel toed boots    HPI [name] is a 52 y.o. male  Here for foot pain. He has a history of diabetes, with last A1C of 8.9 in March. He had planned follow up for diabetes in July with PCP but no showed that appointment. He does take glipizide 5 mg qd.   Patient states he's had 2 weeks of intermittent right lateral foot pain with some callus. He denies any changes in activity. He believes it's caused by his steel-toed boots he wears for work. He believes he might need to put in an extra insole. He's been applying Vaseline over the area.   Regarding his diabetes follow up appointment, he forgot about his appointment and hasn't rescheduled yet. He still has medications at home, and denies any missed doses.   Patient Active Problem List   Diagnosis Date Noted  . Pure hypercholesterolemia   . Abnormal EKG   . Unstable angina (HCC)   . Acute coronary syndrome (HCC) 08/15/2017  . Chest pain 08/15/2017  . Tobacco abuse 08/15/2017  . Hypertensive urgency 08/15/2017  . Diabetes mellitus type 2 in obese (HCC) 08/15/2017  . Normocytic anemia 08/15/2017  . Essential hypertension, benign 08/08/2012  . Diabetes mellitus (HCC) 08/08/2012  . Fracture of lateral malleolus of left ankle 08/07/2012   Past Medical History:  Diagnosis Date  . Abnormal EKG   . Acute coronary syndrome (HCC) 08/15/2017  . CAD in native artery    A. LHC 08/16/17- DES to RCA  . Chest pain 08/15/2017  . Diabetes mellitus (HCC) 08/08/2012  . Diabetes mellitus type 2 in obese  (HCC) 08/15/2017  . Diabetes mellitus without complication (HCC)    but doesn't take his meds and hasn't in months  . Essential hypertension, benign 08/08/2012  . Fracture of lateral malleolus of left ankle 08/07/2012  . High cholesterol   . Hypertension   . Hypertensive urgency 08/15/2017  . Pure hypercholesterolemia   . Sleep apnea   . Tobacco abuse 08/15/2017  . Unstable angina [facility])    Past Surgical History:  Procedure Laterality Date  . CORONARY STENT INTERVENTION N/A 08/16/2017   Procedure: CORONARY STENT INTERVENTION;  Surgeon: Corky Crafts, MD;  Location: [facility] INVASIVE CV LAB;  Service: Cardiovascular;  Laterality: N/A;  . LEFT HEART CATH AND CORONARY ANGIOGRAPHY N/A 08/16/2017   Procedure: LEFT HEART CATH AND CORONARY ANGIOGRAPHY;  Surgeon: Corky Crafts, MD;  Location: [facility] INVASIVE CV LAB;  Service: Cardiovascular;  Laterality: N/A;  . ORIF ANKLE FRACTURE Left 08/07/2012   Procedure: OPEN REDUCTION INTERNAL FIXATION (ORIF) LEFT ANKLE FRACTURE;  Surgeon: Kathryne Hitch, MD;  Location: [facility] OR;  Service: Orthopedics;  Laterality: Left;   No Known Allergies Prior to Admission medications   Medication Sig Start Date End Date Taking? Authorizing Provider  amLODipine (NORVASC) 5 MG tablet Take 1 tablet (5 mg total) by mouth daily. 08/17/17   Berton Bon, NP  aspirin 81 MG chewable tablet Chew 1 tablet (81 mg  total) by mouth daily. 08/18/17   Berton Bon, NP  atorvastatin (LIPITOR) 80 MG tablet Take 1 tablet (80 mg total) by mouth daily at 6 PM. 08/17/17   Berton Bon, NP  carvedilol (COREG) 25 MG tablet Take 1 tablet (25 mg total) by mouth 2 (two) times daily. 08/31/17 11/29/17  Berton Bon, NP  cyclobenzaprine (FLEXERIL) 5 MG tablet 1 pill by mouth up to every 8 hours as needed. Start with one pill by mouth each bedtime as needed due to sedation 11/14/17   Shade Flood, MD  glipiZIDE (GLUCOTROL) 5 MG tablet Take 1 tablet (5 mg total) by mouth daily before  breakfast. 11/14/17   Shade Flood, MD  lisinopril (PRINIVIL,ZESTRIL) 40 MG tablet Take 1 tablet (40 mg total) by mouth daily. 08/18/17   Berton Bon, NP  metFORMIN (GLUCOPHAGE) 1000 MG tablet Take 1 tablet (1,000 mg total) by mouth 2 (two) times daily with a meal. 09/07/17 09/07/18  Wallis Bamberg, PA-C  nitroGLYCERIN (NITROSTAT) 0.4 MG SL tablet Place 1 tablet (0.4 mg total) under the tongue every 5 (five) minutes as needed for chest pain. 08/17/17   Berton Bon, NP  predniSONE (DELTASONE) 20 MG tablet 3 by mouth for 3 days, then 2 by mouth for 2 days, then 1 by mouth for 2 days, then 1/2 by mouth for 2 days. 11/14/17   Shade Flood, MD  ticagrelor (BRILINTA) 90 MG TABS tablet Take 1 tablet (90 mg total) by mouth 2 (two) times daily. 08/17/17   Berton Bon, NP  traMADol (ULTRAM) 50 MG tablet Take 1 tablet (50 mg total) by mouth every 6 (six) hours as needed. 11/14/17   Shade Flood, MD   Social History   Socioeconomic History  . Marital status: Married    Spouse name: Not on file  . Number of children: Not on file  . Years of education: Not on file  . Highest education level: Not on file  Occupational History  . Not on file  Social Needs  . Financial resource strain: Not on file  . Food insecurity:    Worry: Not on file    Inability: Not on file  . Transportation needs:    Medical: Not on file    Non-medical: Not on file  Tobacco Use  . Smoking status: Current Some Day Smoker    Packs/day: 1.00    Types: Cigarettes    Start date: 08/16/2017  . Smokeless tobacco: Never Used  Substance and Sexual Activity  . Alcohol use: Yes    Comment: occ  . Drug use: No  . Sexual activity: Not on file  Lifestyle  . Physical activity:    Days per week: Not on file    Minutes per session: Not on file  . Stress: Not on file  Relationships  . Social connections:    Talks on phone: Not on file    Gets together: Not on file    Attends religious service: Not on file    Active  member of club or organization: Not on file    Attends meetings of clubs or organizations: Not on file    Relationship status: Not on file  . Intimate partner violence:    Fear of current or ex partner: Not on file    Emotionally abused: Not on file    Physically abused: Not on file    Forced sexual activity: Not on file  Other Topics Concern  . Not on file  Social History  Narrative  . Not on file   Review of Systems  Constitutional: Negative for fatigue and unexpected weight change.  Eyes: Negative for visual disturbance.  Respiratory: Negative for cough, chest tightness and shortness of breath.   Cardiovascular: Negative for chest pain, palpitations and leg swelling.  Gastrointestinal: Negative for abdominal pain and blood in stool.  Musculoskeletal: Positive for myalgias.  Neurological: Negative for dizziness, light-headedness and headaches.       Objective:   Physical Exam  Constitutional: He is oriented to person, place, and time. He appears well-developed and well-nourished. No distress.  HENT:  Head: Normocephalic and atraumatic.  Eyes: Pupils are equal, round, and reactive to light. EOM are normal.  Neck: Neck supple.  Cardiovascular: Normal rate.  Pulmonary/Chest: Effort normal. No respiratory distress.  Musculoskeletal: Normal range of motion.  Neurological: He is alert and oriented to person, place, and time.  Skin: Skin is warm and dry.  Right foot: dry skin into plantar feet with thickened toenails, skin surrounding toenails intact, no skin breakdown between the toes, dorsal and plantar of 5th toe and MTP non tender, tender over the callused area at the 5th metatarsal head but no surrounding erythema or induration, skin intact, no wound, sensation intact distally  Psychiatric: He has a normal mood and affect. His behavior is normal.  Nursing note and vitals reviewed.   Vitals:   03/07/18 0953  BP: (!) 158/94  Pulse: 84  Resp: 18  Temp: 98.3 F (36.8 C)    TempSrc: Oral  SpO2: 99%  Weight: 235 lb 12.8 oz (107 kg)  Height: 5\' 10"  (1.778 m)      Assessment & Plan:   JEMAL MISKELL is a 52 y.o. male Pain in joint involving right ankle and foot - Plan: Ambulatory referral to Podiatry Callus of foot - Plan: Ambulatory referral to Podiatry Type 2 diabetes mellitus with hyperglycemia, without long-term current use of insulin (HCC)  -Discomfort at plantar callus fifth metatarsal head.  No signs of surrounding infection, no specific bony tenderness to suggest stress injury or history.  Metatarsal pad provided with instructions on use, refer to podiatry given history of diabetes for ongoing foot care.  RTC precautions of skin erythema, wound or worsening  -Plans for follow-up for ongoing medical issues including diabetes in the next few weeks.  Flu vaccine need - Plan: Flu Vaccine QUAD 36+ mos IM   No orders of the defined types were placed in this encounter.  Patient Instructions   I will refer you to podiatrist, but you can try the shoe insert temporarily to take the pressure off of that area of the foot and callus.  The insert causes more discomfort, then remove it until you have been seen by podiatry.  Return to the clinic or go to the nearest emergency room if any of your symptoms worsen or new symptoms occur.  Please schedule appointment with new primary provider in next 2 weeks for diabetes and other chronic medical issues.   Thank you for coming in today.   If you have lab work done today you will be contacted with your lab results within the next 2 weeks.  If you have not heard from [place] then please contact us. The fastest way to get your results is to register for My Chart.   IF you received an x-ray today, you will receive an invoice from [facility]. Please contact [facility] at [phone] with questions or concerns regarding your invoice.   IF you  received labwork today, you will receive an invoice from  [facility]. Please contact [facility] at [phone] with questions or concerns regarding your invoice.   Our billing staff will not be able to assist you with questions regarding bills from these companies.  You will be contacted with the lab results as soon as they are available. The fastest way to get your results is to activate your My Chart account. Instructions are located on the last page of this paperwork. If you have not heard from [place] regarding the results in 2 weeks, please contact this office.      I personally performed the services described in this documentation, which was scribed in my presence. The recorded information has been reviewed and considered for accuracy and completeness, addended by me as needed, and agree with information above.  Signed,   Clifford Staggers, MD [facility].  03/07/18 11:00 AM

## 2018-03-07 NOTE — Patient Instructions (Addendum)
I will refer you to podiatrist, but you can try the shoe insert temporarily to take the pressure off of that area of the foot and callus.  The insert causes more discomfort, then remove it until you have been seen by podiatry.  Return to the clinic or go to the nearest emergency room if any of your symptoms worsen or new symptoms occur.  Please schedule appointment with new primary provider in next 2 weeks for diabetes and other chronic medical issues.   Thank you for coming in today.   If you have lab work done today you will be contacted with your lab results within the next 2 weeks.  If you have not heard from [place] then please contact us. The fastest way to get your results is to register for My Chart.   IF you received an x-ray today, you will receive an invoice from [facility]. Please contact [facility] at [phone] with questions or concerns regarding your invoice.   IF you received labwork today, you will receive an invoice from [facility]. Please contact [facility] at [phone] with questions or concerns regarding your invoice.   Our billing staff will not be able to assist you with questions regarding bills from these companies.  You will be contacted with the lab results as soon as they are available. The fastest way to get your results is to activate your My Chart account. Instructions are located on the last page of this paperwork. If you have not heard from [place] regarding the results in 2 weeks, please contact this office.

## 2018-03-21 ENCOUNTER — Ambulatory Visit (INDEPENDENT_AMBULATORY_CARE_PROVIDER_SITE_OTHER): Payer: 59 | Admitting: Physician Assistant

## 2018-03-21 ENCOUNTER — Other Ambulatory Visit: Payer: Self-pay

## 2018-03-21 ENCOUNTER — Encounter: Payer: Self-pay | Admitting: Physician Assistant

## 2018-03-21 VITALS — BP 152/90 | HR 81 | Temp 98.1°F | Resp 18 | Ht 70.0 in | Wt 239.6 lb

## 2018-03-21 DIAGNOSIS — E7800 Pure hypercholesterolemia, unspecified: Secondary | ICD-10-CM | POA: Diagnosis not present

## 2018-03-21 DIAGNOSIS — I10 Essential (primary) hypertension: Secondary | ICD-10-CM | POA: Diagnosis not present

## 2018-03-21 DIAGNOSIS — J309 Allergic rhinitis, unspecified: Secondary | ICD-10-CM

## 2018-03-21 DIAGNOSIS — E1165 Type 2 diabetes mellitus with hyperglycemia: Secondary | ICD-10-CM | POA: Diagnosis not present

## 2018-03-21 DIAGNOSIS — G4733 Obstructive sleep apnea (adult) (pediatric): Secondary | ICD-10-CM

## 2018-03-21 DIAGNOSIS — Z720 Tobacco use: Secondary | ICD-10-CM

## 2018-03-21 DIAGNOSIS — Z7689 Persons encountering health services in other specified circumstances: Secondary | ICD-10-CM

## 2018-03-21 DIAGNOSIS — Z23 Encounter for immunization: Secondary | ICD-10-CM

## 2018-03-21 DIAGNOSIS — I2510 Atherosclerotic heart disease of native coronary artery without angina pectoris: Secondary | ICD-10-CM

## 2018-03-21 LAB — POCT URINALYSIS DIP (MANUAL ENTRY)
Bilirubin, UA: NEGATIVE
Blood, UA: NEGATIVE
Glucose, UA: NEGATIVE mg/dL
Ketones, POC UA: NEGATIVE mg/dL
Leukocytes, UA: NEGATIVE
Nitrite, UA: NEGATIVE
Spec Grav, UA: 1.025 (ref 1.010–1.025)
Urobilinogen, UA: 1 E.U./dL
pH, UA: 6 (ref 5.0–8.0)

## 2018-03-21 LAB — POCT GLYCOSYLATED HEMOGLOBIN (HGB A1C): Hemoglobin A1C: 7.5 % — AB (ref 4.0–5.6)

## 2018-03-21 MED ORDER — DAPAGLIFLOZIN PROPANEDIOL 5 MG PO TABS: 5.0000 mg | ORAL_TABLET | Freq: Every day | ORAL | 0 refills | Status: DC

## 2018-03-21 MED ORDER — FLUTICASONE PROPIONATE 50 MCG/ACT NA SUSP: 2.0000 | Freq: Every day | NASAL | 6 refills | Status: DC

## 2018-03-21 MED ORDER — CETIRIZINE HCL 10 MG PO TABS: 10.0000 mg | ORAL_TABLET | Freq: Every day | ORAL | 0 refills | Status: DC

## 2018-03-21 MED ORDER — ZOSTER VAC RECOMB ADJUVANTED 50 MCG/0.5ML IM SUSR: 0.5000 mL | Freq: Once | INTRAMUSCULAR | 0 refills | Status: AC

## 2018-03-21 NOTE — Patient Instructions (Addendum)
For diabetes, stop glipizide.  Continue metformin.  Start [place].  If for some reason your insurance does not cover this medication, please let me know. Try to focus on decreasing the amount of carbohydrates and sugar in your diet.  Start exercising daily.  In terms of elevated blood pressure, continue medication as you are. I would like you to check your blood pressure at least a couple times over the next week outside of the office and document these values. It is best if you check the blood pressure at different times in the day. Your goal is <140/90. If your values are consistently above this goal, please return to office for further evaluation. If you start to have chest pain, blurred vision, shortness of breath, severe headache, lower leg swelling, or nausea/vomiting please seek care immediately here or at the ED.   Contact your cardiologist to see when you are supposed to follow-up.  I have placed a referral for neurology and they should contact you within the next 2 weeks for sleep apnea.  For runny nose, sneezing, itchy watery eyes, start Zyrtec and Flonase daily.  If you have lab work done today you will be contacted with your lab results within the next 2 weeks.  If you have not heard from [place] then please contact us. The fastest way to get your results is to register for My Chart.   Diabetes Mellitus and Nutrition When you have diabetes (diabetes mellitus), it is very important to have healthy eating habits because your blood sugar (glucose) levels are greatly affected by what you eat and drink. Eating healthy foods in the appropriate amounts, at about the same times every day, can help you:  Control your blood glucose.  Lower your risk of heart disease.  Improve your blood pressure.  Reach or maintain a healthy weight.  Every person with diabetes is different, and each person has different needs for a meal plan. Your health care provider may recommend that you work with a diet  and nutrition specialist (dietitian) to make a meal plan that is best for you. Your meal plan may vary depending on factors such as:  The calories you need.  The medicines you take.  Your weight.  Your blood glucose, blood pressure, and cholesterol levels.  Your activity level.  Other health conditions you have, such as heart or kidney disease.  How do carbohydrates affect me? Carbohydrates affect your blood glucose level more than any other type of food. Eating carbohydrates naturally increases the amount of glucose in your blood. Carbohydrate counting is a method for keeping track of how many carbohydrates you eat. Counting carbohydrates is important to keep your blood glucose at a healthy level, especially if you use insulin or take certain oral diabetes medicines. It is important to know how many carbohydrates you can safely have in each meal. This is different for every person. Your dietitian can help you calculate how many carbohydrates you should have at each meal and for snack. Foods that contain carbohydrates include:  Bread, cereal, rice, pasta, and crackers.  Potatoes and corn.  Peas, beans, and lentils.  Milk and yogurt.  Fruit and juice.  Desserts, such as cakes, cookies, ice cream, and candy.  How does alcohol affect me? Alcohol can cause a sudden decrease in blood glucose (hypoglycemia), especially if you use insulin or take certain oral diabetes medicines. Hypoglycemia can be a life-threatening condition. Symptoms of hypoglycemia (sleepiness, dizziness, and confusion) are similar to symptoms of having too much  alcohol. If your health care provider says that alcohol is safe for you, follow these guidelines:  Limit alcohol intake to no more than 1 drink per day for nonpregnant women and 2 drinks per day for men. One drink equals 12 oz of beer, 5 oz of wine, or 1 oz of hard liquor.  Do not drink on an empty stomach.  Keep yourself hydrated with water, diet soda,  or unsweetened iced tea.  Keep in mind that regular soda, juice, and other mixers may contain a lot of sugar and must be counted as carbohydrates.  What are tips for following this plan? Reading food labels  Start by checking the serving size on the label. The amount of calories, carbohydrates, fats, and other nutrients listed on the label are based on one serving of the food. Many foods contain more than one serving per package.  Check the total grams (g) of carbohydrates in one serving. You can calculate the number of servings of carbohydrates in one serving by dividing the total carbohydrates by 15. For example, if a food has 30 g of total carbohydrates, it would be equal to 2 servings of carbohydrates.  Check the number of grams (g) of saturated and trans fats in one serving. Choose foods that have low or no amount of these fats.  Check the number of milligrams (mg) of sodium in one serving. Most people should limit total sodium intake to less than 2,300 mg per day.  Always check the nutrition information of foods labeled as "low-fat" or "nonfat". These foods may be higher in added sugar or refined carbohydrates and should be avoided.  Talk to your dietitian to identify your daily goals for nutrients listed on the label. Shopping  Avoid buying canned, premade, or processed foods. These foods tend to be high in fat, sodium, and added sugar.  Shop around the outside edge of the grocery store. This includes fresh fruits and vegetables, bulk grains, fresh meats, and fresh dairy. Cooking  Use low-heat cooking methods, such as baking, instead of high-heat cooking methods like deep frying.  Cook using healthy oils, such as olive, canola, or sunflower oil.  Avoid cooking with butter, cream, or high-fat meats. Meal planning  Eat meals and snacks regularly, preferably at the same times every day. Avoid going long periods of time without eating.  Eat foods high in fiber, such as fresh  fruits, vegetables, beans, and whole grains. Talk to your dietitian about how many servings of carbohydrates you can eat at each meal.  Eat 4-6 ounces of lean protein each day, such as lean meat, chicken, fish, eggs, or tofu. 1 ounce is equal to 1 ounce of meat, chicken, or fish, 1 egg, or 1/4 cup of tofu.  Eat some foods each day that contain healthy fats, such as avocado, nuts, seeds, and fish. Lifestyle   Check your blood glucose regularly.  Exercise at least 30 minutes 5 or more days each week, or as told by your health care provider.  Take medicines as told by your health care provider.  Do not use any products that contain nicotine or tobacco, such as cigarettes and e-cigarettes. If you need help quitting, ask your health care provider.  Work with a Veterinary surgeon or diabetes educator to identify strategies to manage stress and any emotional and social challenges. What are some questions to ask my health care provider?  Do I need to meet with a diabetes educator?  Do I need to meet with a dietitian?  What number can I call if I have questions?  When are the best times to check my blood glucose? Where to find more information:  American Diabetes Association: diabetes.org/food-and-fitness/food  Academy of Nutrition and Dietetics: https://www.vargas.com/  [employer] of Diabetes and Digestive and Kidney Diseases (NIH): FindJewelers.cz Summary  A healthy meal plan will help you control your blood glucose and maintain a healthy lifestyle.  Working with a diet and nutrition specialist (dietitian) can help you make a meal plan that is best for you.  Keep in mind that carbohydrates and alcohol have immediate effects on your blood glucose levels. It is important to count carbohydrates and to use alcohol carefully. This information is not intended to replace advice  given to you by your health care provider. Make sure you discuss any questions you have with your health care provider. Document Released: 02/17/2005 Document Revised: 06/27/2016 Document Reviewed: 06/27/2016 Elsevier Interactive Patient Education  2018 [employer].  DASH Eating Plan DASH stands for "Dietary Approaches to Stop Hypertension." The DASH eating plan is a healthy eating plan that has been shown to reduce high blood pressure (hypertension). It may also reduce your risk for type 2 diabetes, heart disease, and stroke. The DASH eating plan may also help with weight loss. What are tips for following this plan? General guidelines  Avoid eating more than 2,300 mg (milligrams) of salt (sodium) a day. If you have hypertension, you may need to reduce your sodium intake to 1,500 mg a day.  Limit alcohol intake to no more than 1 drink a day for nonpregnant women and 2 drinks a day for men. One drink equals 12 oz of beer, 5 oz of wine, or 1 oz of hard liquor.  Work with your health care provider to maintain a healthy body weight or to lose weight. Ask what an ideal weight is for you.  Get at least 30 minutes of exercise that causes your heart to beat faster (aerobic exercise) most days of the week. Activities may include walking, swimming, or biking.  Work with your health care provider or diet and nutrition specialist (dietitian) to adjust your eating plan to your individual calorie needs. Reading food labels  Check food labels for the amount of sodium per serving. Choose foods with less than 5 percent of the Daily Value of sodium. Generally, foods with less than 300 mg of sodium per serving fit into this eating plan.  To find whole grains, look for the word "whole" as the first word in the ingredient list. Shopping  Buy products labeled as "low-sodium" or "no salt added."  Buy fresh foods. Avoid canned foods and premade or frozen meals. Cooking  Avoid adding salt when cooking. Use  salt-free seasonings or herbs instead of table salt or sea salt. Check with your health care provider or pharmacist before using salt substitutes.  Do not fry foods. Cook foods using healthy methods such as baking, boiling, grilling, and broiling instead.  Cook with heart-healthy oils, such as olive, canola, soybean, or sunflower oil. Meal planning   Eat a balanced diet that includes: ? 5 or more servings of fruits and vegetables each day. At each meal, try to fill half of your plate with fruits and vegetables. ? Up to 6-8 servings of whole grains each day. ? Less than 6 oz of lean meat, poultry, or fish each day. A 3-oz serving of meat is about the same size as a deck of cards. One egg equals 1 oz. ? 2 servings  of low-fat dairy each day. ? A serving of nuts, seeds, or beans 5 times each week. ? Heart-healthy fats. Healthy fats called Omega-3 fatty acids are found in foods such as flaxseeds and coldwater fish, like sardines, salmon, and mackerel.  Limit how much you eat of the following: ? Canned or prepackaged foods. ? Food that is high in trans fat, such as fried foods. ? Food that is high in saturated fat, such as fatty meat. ? Sweets, desserts, sugary drinks, and other foods with added sugar. ? Full-fat dairy products.  Do not salt foods before eating.  Try to eat at least 2 vegetarian meals each week.  Eat more home-cooked food and less restaurant, buffet, and fast food.  When eating at a restaurant, ask that your food be prepared with less salt or no salt, if possible. What foods are recommended? The items listed may not be a complete list. Talk with your dietitian about what dietary choices are best for you. Grains Whole-grain or whole-wheat bread. Whole-grain or whole-wheat pasta. Brown rice. Orpah Cobb. Bulgur. Whole-grain and low-sodium cereals. Pita bread. Low-fat, low-sodium crackers. Whole-wheat flour tortillas. Vegetables Fresh or frozen vegetables (raw, steamed,  roasted, or grilled). Low-sodium or reduced-sodium tomato and vegetable juice. Low-sodium or reduced-sodium tomato sauce and tomato paste. Low-sodium or reduced-sodium canned vegetables. Fruits All fresh, dried, or frozen fruit. Canned fruit in natural juice (without added sugar). Meat and other protein foods Skinless chicken or [place]. Ground chicken or [place]. Pork with fat trimmed off. Fish and seafood. Egg whites. Dried beans, peas, or lentils. Unsalted nuts, nut butters, and seeds. Unsalted canned beans. Lean cuts of beef with fat trimmed off. Low-sodium, lean deli meat. Dairy Low-fat (1%) or fat-free (skim) milk. Fat-free, low-fat, or reduced-fat cheeses. Nonfat, low-sodium ricotta or cottage cheese. Low-fat or nonfat yogurt. Low-fat, low-sodium cheese. Fats and oils Soft margarine without trans fats. Vegetable oil. Low-fat, reduced-fat, or light mayonnaise and salad dressings (reduced-sodium). Canola, safflower, olive, soybean, and sunflower oils. Avocado. Seasoning and other foods Herbs. Spices. Seasoning mixes without salt. Unsalted popcorn and pretzels. Fat-free sweets. What foods are not recommended? The items listed may not be a complete list. Talk with your dietitian about what dietary choices are best for you. Grains Baked goods made with fat, such as croissants, muffins, or some breads. Dry pasta or rice meal packs. Vegetables Creamed or fried vegetables. Vegetables in a cheese sauce. Regular canned vegetables (not low-sodium or reduced-sodium). Regular canned tomato sauce and paste (not low-sodium or reduced-sodium). Regular tomato and vegetable juice (not low-sodium or reduced-sodium). Rosita Fire. Olives. Fruits Canned fruit in a light or heavy syrup. Fried fruit. Fruit in cream or butter sauce. Meat and other protein foods Fatty cuts of meat. Ribs. Fried meat. Tomasa Blase. Sausage. Bologna and other processed lunch meats. Salami. Fatback. Hotdogs. Bratwurst. Salted nuts and seeds. Canned  beans with added salt. Canned or smoked fish. Whole eggs or egg yolks. Chicken or [place] with skin. Dairy Whole or 2% milk, cream, and half-and-half. Whole or full-fat cream cheese. Whole-fat or sweetened yogurt. Full-fat cheese. Nondairy creamers. Whipped toppings. Processed cheese and cheese spreads. Fats and oils Butter. Stick margarine. Lard. Shortening. Ghee. Bacon fat. Tropical oils, such as coconut, palm kernel, or palm oil. Seasoning and other foods Salted popcorn and pretzels. Onion salt, garlic salt, seasoned salt, table salt, and sea salt. Worcestershire sauce. Tartar sauce. Barbecue sauce. Teriyaki sauce. Soy sauce, including reduced-sodium. Steak sauce. Canned and packaged gravies. Fish sauce. Oyster sauce. Cocktail sauce. Horseradish that  you find on the shelf. Ketchup. Mustard. Meat flavorings and tenderizers. Bouillon cubes. Hot sauce and Tabasco sauce. Premade or packaged marinades. Premade or packaged taco seasonings. Relishes. Regular salad dressings. Where to find more information:  National Heart, Lung, and Blood Institute: PopSteam.is  American Heart Association: www.heart.org Summary  The DASH eating plan is a healthy eating plan that has been shown to reduce high blood pressure (hypertension). It may also reduce your risk for type 2 diabetes, heart disease, and stroke.  With the DASH eating plan, you should limit salt (sodium) intake to 2,300 mg a day. If you have hypertension, you may need to reduce your sodium intake to 1,500 mg a day.  When on the DASH eating plan, aim to eat more fresh fruits and vegetables, whole grains, lean proteins, low-fat dairy, and heart-healthy fats.  Work with your health care provider or diet and nutrition specialist (dietitian) to adjust your eating plan to your individual calorie needs. This information is not intended to replace advice given to you by your health care provider. Make sure you discuss any questions you have with your  health care provider. Document Released: 05/12/2011 Document Revised: 05/16/2016 Document Reviewed: 05/16/2016 Elsevier Interactive Patient Education  [employer].  Allergies An allergy is when your body reacts to a substance in a way that is not normal. An allergic reaction can happen after you:  Eat something.  Breathe in something.  Touch something.  You can be allergic to:  Things that are only around during certain seasons, like molds and pollens.  Foods.  Drugs.  Insects.  Animal dander.  What are the signs or symptoms?  Puffiness (swelling). This may happen on the lips, face, tongue, mouth, or throat.  Sneezing.  Coughing.  Breathing loudly (wheezing).  Stuffy nose.  Tingling in the mouth.  A rash.  Itching.  Itchy, red, puffy areas of skin (hives).  Watery eyes.  Throwing up (vomiting).  Watery poop (diarrhea).  Dizziness.  Feeling faint or fainting.  Trouble breathing or swallowing.  A tight feeling in the chest.  A fast heartbeat. How is this diagnosed? Allergies can be diagnosed with:  A medical and family history.  Skin tests.  Blood tests.  A food diary. A food diary is a record of all the foods, drinks, and symptoms you have each day.  The results of an elimination diet. This diet involves making sure not to eat certain foods and then seeing what happens when you start eating them again.  How is this treated? There is no cure for allergies, but allergic reactions can be treated with medicine. Severe reactions usually need to be treated at a hospital. How is this prevented? The best way to prevent an allergic reaction is to avoid the thing you are allergic to. Allergy shots and medicines can also help prevent reactions in some cases. This information is not intended to replace advice given to you by your health care provider. Make sure you discuss any questions you have with your health care provider. Document Released:  09/17/2012 Document Revised: 01/18/2016 Document Reviewed: 03/04/2014 Elsevier Interactive Patient Education  2018 [employer].  IF you received an x-ray today, you will receive an invoice from [facility]. Please contact [facility] at [phone] with questions or concerns regarding your invoice.   IF you received labwork today, you will receive an invoice from [facility]. Please contact [facility] at [phone] with questions or concerns regarding your invoice.   Our billing staff will not  be able to assist you with questions regarding bills from these companies.  You will be contacted with the lab results as soon as they are available. The fastest way to get your results is to activate your My Chart account. Instructions are located on the last page of this paperwork. If you have not heard from [place] regarding the results in 2 weeks, please contact this office.

## 2018-03-21 NOTE — Progress Notes (Signed)
MRN: [id number]  Subjective:   Clifford Frederick is a 52 y.o. male who presents for follow up on chronic medical conditions.  -T2DM: "a long time."  Last A1C in 08/2017 was 8.9.Patient is currently managed with metformin '1000mg'$  BID and glipizide '5mg'$  daily. Has been consistent on this dose for ~5 months.  Admits good compliance. Denies adverse effects including metallic taste, hypoglycemia, nausea, vomiting. Patient is not checking home blood sugars.  Current symptoms include none. Patient denies foot ulcerations, nausea, paresthesia of the feet, polydipsia, polyuria, visual disturbances, vomiting and weight loss. Patient is checking their feet daily. No foot concerns. Last diabetic eye exam eye exam: years ago. Diet includes meat and veggies. Some desserts. Drinks soda occasionally. Drinks water. No structured exercise. Smokes about 3 cigs a day. Drinks about a 6 pack per week.  Known diabetic complications: Immunizations: Flu vaccine: 03/07/18 shingles: never, pneumococal vaccine 09/07/2017. No PMH of pancreatitis or thyroid cancer.   -HTN: since ~2005. Takes norvasc '5mg'$  daily, lisinopril '40mg'$  daily,  and coreg '25mg'$  BID. Checks it at home and it is typically lower than 140/90.   -HLD: Takes lipitor '80mg'$  daily.   -OSA: Had sleep study years ago, dx with OSA. Could not afford CPAP. Snores nightly and some daytime somnolence. No witnessed apneic episodes.   -CAD: Followed by cardiology, "it's been a while since I have seen them." No recent chest pain, SOB, palpitations. Had one stent placed. Takes brilinta '90mg'$  BID and coreg '25mg'$  BID. Takes aspirin '81mg'$  daily.   Heart cath 08/16/17  Left Anterior Descending  The vessel exhibits minimal luminal irregularities.  Left Circumflex  Prox Cx to Mid Cx lesion 25% stenosed  Prox Cx to Mid Cx lesion is 25% stenosed.  Right Coronary Artery  Dist RCA lesion 80% stenosed  Dist RCA lesion is 80% stenosed. Vessel is the culprit lesion.   Echo 08/17/17 time  with EF of 60-65% - Left ventricle: The cavity size was normal. There was moderate   concentric hypertrophy. Systolic function was normal. The   estimated ejection fraction was in the range of 60% to 65%. There   is hypokinesis of the apicalinferolateral myocardium. Features   are consistent with a pseudonormal left ventricular filling   pattern, with concomitant abnormal relaxation and increased   filling pressure (grade 2 diastolic dysfunction). Doppler   parameters are consistent with high ventricular filling pressure. - Aortic valve: Trileaflet; mildly thickened, mildly calcified   leaflets. - Mitral valve: Calcified annulus. There was trivial regurgitation. - Left atrium: The atrium was mildly dilated. - Pulmonary arteries: Systolic pressure could not be accurately   estimated.  Other concerns: -Runny nose, sneezing, itchy watery eyes daily x 1 month. They keep the door open all day at work and he notices it most often after work when he gets home and is sitting down. Has never had dx of seasonal allergies. Denies fever, chills, sinus pain, productive cough.  Patient Active Problem List   Diagnosis Date Noted  . CAD in native artery   . Pure hypercholesterolemia   . Tobacco abuse 08/15/2017  . Diabetes mellitus type 2 in obese ([place]) 08/15/2017  . Normocytic anemia 08/15/2017  . Essential hypertension, benign 08/08/2012   Past Medical History:  Diagnosis Date  . Abnormal EKG   . Acute coronary syndrome ([place]) 08/15/2017  . CAD in native artery    A. LHC 08/16/17- DES to RCA  . Chest pain 08/15/2017  . Diabetes mellitus type 2 in obese (  [place]) 08/15/2017  . Essential hypertension, benign 08/08/2012  . Fracture of lateral malleolus of left ankle 08/07/2012  . High cholesterol   . Hypertensive urgency 08/15/2017  . Sleep apnea   . Tobacco abuse 08/15/2017  . Unstable angina (HCC)    Family History  Problem Relation Age of Onset  . Diabetes Mother   . Hypertension Mother   .  Diabetes Brother   . Hypertension Brother    Social History   Socioeconomic History  . Marital status: Married    Spouse name: Not on file  . Number of children: Not on file  . Years of education: Not on file  . Highest education level: Not on file  Occupational History  . Not on file  Social Needs  . Financial resource strain: Not on file  . Food insecurity:    Worry: Not on file    Inability: Not on file  . Transportation needs:    Medical: Not on file    Non-medical: Not on file  Tobacco Use  . Smoking status: Current Some Day Smoker    Packs/day: 0.25    Years: 20.00    Pack years: 5.00    Types: Cigarettes    Start date: 08/16/2017  . Smokeless tobacco: Never Used  . Tobacco comment: currently smokes 3 cigarettes a day  Substance and Sexual Activity  . Alcohol use: Yes    Alcohol/week: 6.0 standard drinks    Types: 6 Cans of beer per week    Comment: occ  . Drug use: No  . Sexual activity: Not on file  Lifestyle  . Physical activity:    Days per week: Not on file    Minutes per session: Not on file  . Stress: Not on file  Relationships  . Social connections:    Talks on phone: Not on file    Gets together: Not on file    Attends religious service: Not on file    Active member of club or organization: Not on file    Attends meetings of clubs or organizations: Not on file    Relationship status: Not on file  . Intimate partner violence:    Fear of current or ex partner: Not on file    Emotionally abused: Not on file    Physically abused: Not on file    Forced sexual activity: Not on file  Other Topics Concern  . Not on file  Social History Narrative  . Not on file      Objective:   PHYSICAL EXAM BP (!) 152/90   Pulse 81   Temp 98.1 F (36.7 C) (Oral)   Resp 18   Ht '5\' 10"'$  (1.778 m)   Wt 239 lb 9.6 Clifford (108.7 kg)   SpO2 98%   BMI 34.38 kg/m   Physical Exam  Constitutional: He is oriented to person, place, and time. He appears well-developed  and well-nourished. No distress.  HENT:  Head: Normocephalic and atraumatic.  Right Ear: External ear and ear canal normal. Tympanic membrane is not erythematous and not bulging. A middle ear effusion is present.  Left Ear: External ear and ear canal normal. Tympanic membrane is not erythematous and not bulging. A middle ear effusion is present.  Nose: Mucosal edema present. Right sinus exhibits no maxillary sinus tenderness and no frontal sinus tenderness. Left sinus exhibits no maxillary sinus tenderness and no frontal sinus tenderness.  Mouth/Throat: Uvula is midline and mucous membranes are normal. Posterior oropharyngeal erythema present. Tonsils  are 1+ on the right. Tonsils are 1+ on the left. No tonsillar exudate.  Eyes: Pupils are equal, round, and reactive to light. EOM and lids are normal. Right eye exhibits discharge (watery). Left eye exhibits discharge (watery). Right conjunctiva is injected (mild). Left conjunctiva is injected (mild).  Neck: Normal range of motion.  Cardiovascular: Normal rate, regular rhythm, normal heart sounds and intact distal pulses.  Pulmonary/Chest: Effort normal and breath sounds normal. He has no wheezes. He has no rhonchi. He has no rales.  Abdominal: Soft. Normal appearance and bowel sounds are normal. There is no tenderness.  Musculoskeletal:       Right lower leg: He exhibits no edema.       Left lower leg: He exhibits no edema.  Neurological: He is alert and oriented to person, place, and time.  Skin: Skin is warm and dry.  Psychiatric: He has a normal mood and affect.  Vitals reviewed.   BP Readings from Last 3 Encounters:  03/23/18 133/81  03/21/18 (!) 152/90  03/07/18 (!) 158/94   Wt Readings from Last 3 Encounters:  03/21/18 239 lb 9.6 Clifford (108.7 kg)  03/07/18 235 lb 12.8 Clifford (107 kg)  11/28/17 239 lb (108.4 kg)     Assessment and Plan :  1. Type 2 diabetes mellitus with hyperglycemia, without long-term current use of insulin (HCC) A1c  has improved from 8.9->7.5.  Due to cardiovascular history and weight, patient with benefit from being on SGLT2 inhibitor as second agent.  Recommend discontinuing glipizide.  Continue metformin.  Start [place].  Follow-up in 3 months for reevaluation.  Educated on dietary changes and exercise.  Labs pending. - CBC with Differential/Platelet - CMP14+EGFR - Lipid panel - TSH - POCT urinalysis dipstick - POCT glycosylated hemoglobin (Hb A1C) - Microalbumin/Creatinine Ratio, Urine - Ambulatory referral to Ophthalmology  2. Essential hypertension, benign Slightly uncontrolled today.  Reported home BP readings are within normal limits.  Encouraged to continue checking BP frequently at home, if consistently greater than 140/90, please return to office.  Continue medications as prescribed.  3. OSA (obstructive sleep apnea) - Ambulatory referral to Neurology  4. Pure hypercholesterolemia - Lipid panel  5. CAD in native artery Followed by cardiology.  Does not have a follow-up appointment scheduled.  Recommended he contact them today and schedule follow-up appointment 6. Tobacco abuse Educated on smoking cessation, patient is not ready to quit at this time.  7. Allergic rhinitis, unspecified seasonality, unspecified trigger Physical exam findings consistent with a left.  Given Rx for oral antihistamine and nasal steroid.  Follow-up as needed. - cetirizine (ZYRTEC) 10 MG tablet; Take 1 tablet (10 mg total) by mouth daily.  Dispense: 90 tablet; Refill: 0 - fluticasone (FLONASE) 50 MCG/ACT nasal spray; Place 2 sprays into both nostrils daily.  Dispense: 16 g; Refill: 6  8. Need for shingles vaccine - Zoster Vaccine Adjuvanted [facility]) injection; Inject 0.5 mLs into the muscle once for 1 dose.  Dispense: 0.5 mL; Refill: 0  9. Encounter to establish care I am happy to take over patient's care.  I have thoroughly reviewed patient's history with him in office today.  Plan for above.  A total  of 40 minutes was spent in the room with the patient, greater than 50% of which was in counseling/coordination of care regarding chronic medical conditions and acute issues as above.   Tenna Delaine, PA-C  [facility] 03/23/2018 11:19 AM

## 2018-03-22 ENCOUNTER — Telehealth: Payer: Self-pay | Admitting: Physician Assistant

## 2018-03-22 LAB — MICROALBUMIN / CREATININE URINE RATIO
Creatinine, Urine: 198.3 mg/dL
Microalb/Creat Ratio: 30.8 mg/g creat — ABNORMAL HIGH (ref 0.0–30.0)
Microalbumin, Urine: 61.1 ug/mL

## 2018-03-22 LAB — CBC WITH DIFFERENTIAL/PLATELET
Basophils Absolute: 0.1 10*3/uL (ref 0.0–0.2)
Basos: 1 %
EOS (ABSOLUTE): 0.2 10*3/uL (ref 0.0–0.4)
Eos: 2 %
Hematocrit: 37.5 % (ref 37.5–51.0)
Hemoglobin: 11.9 g/dL — ABNORMAL LOW (ref 13.0–17.7)
Immature Grans (Abs): 0 10*3/uL (ref 0.0–0.1)
Immature Granulocytes: 0 %
Lymphocytes Absolute: 2.8 10*3/uL (ref 0.7–3.1)
Lymphs: 29 %
MCH: 28.1 pg (ref 26.6–33.0)
MCHC: 31.7 g/dL (ref 31.5–35.7)
MCV: 88 fL (ref 79–97)
Monocytes Absolute: 0.6 10*3/uL (ref 0.1–0.9)
Monocytes: 7 %
Neutrophils Absolute: 6 10*3/uL (ref 1.4–7.0)
Neutrophils: 61 %
Platelets: 215 10*3/uL (ref 150–450)
RBC: 4.24 x10E6/uL (ref 4.14–5.80)
RDW: 14.1 % (ref 12.3–15.4)
WBC: 9.6 10*3/uL (ref 3.4–10.8)

## 2018-03-22 LAB — CMP14+EGFR
ALT: 29 IU/L (ref 0–44)
AST: 24 IU/L (ref 0–40)
Albumin/Globulin Ratio: 2 (ref 1.2–2.2)
Albumin: 4.6 g/dL (ref 3.5–5.5)
Alkaline Phosphatase: 50 IU/L (ref 39–117)
BUN/Creatinine Ratio: 13 (ref 9–20)
BUN: 12 mg/dL (ref 6–24)
Bilirubin Total: 0.4 mg/dL (ref 0.0–1.2)
CO2: 20 mmol/L (ref 20–29)
Calcium: 9.6 mg/dL (ref 8.7–10.2)
Chloride: 103 mmol/L (ref 96–106)
Creatinine, Ser: 0.94 mg/dL (ref 0.76–1.27)
GFR calc Af Amer: 107 mL/min/{1.73_m2} (ref >59)
GFR calc non Af Amer: 93 mL/min/{1.73_m2} (ref >59)
Globulin, Total: 2.3 g/dL (ref 1.5–4.5)
Glucose: 113 mg/dL — ABNORMAL HIGH (ref 65–99)
Potassium: 4.1 mmol/L (ref 3.5–5.2)
Sodium: 141 mmol/L (ref 134–144)
Total Protein: 6.9 g/dL (ref 6.0–8.5)

## 2018-03-22 LAB — LIPID PANEL
Chol/HDL Ratio: 2.7 ratio (ref 0.0–5.0)
Cholesterol, Total: 107 mg/dL (ref 100–199)
HDL: 39 mg/dL — ABNORMAL LOW (ref >39)
LDL Calculated: 48 mg/dL (ref 0–99)
Triglycerides: 100 mg/dL (ref 0–149)
VLDL Cholesterol Cal: 20 mg/dL (ref 5–40)

## 2018-03-22 LAB — TSH: TSH: 1.55 u[IU]/mL (ref 0.450–4.500)

## 2018-03-22 MED ORDER — EMPAGLIFLOZIN 10 MG PO TABS: 10.0000 mg | ORAL_TABLET | Freq: Every day | ORAL | 0 refills | Status: DC

## 2018-03-22 NOTE — Telephone Encounter (Signed)
Patient was denied the medication Marcelline Deist by his insurance company. Letter states that he needs to try invokana or jardiance first.

## 2018-03-22 NOTE — Telephone Encounter (Signed)
Please call patient and let him know that I have sent in a new prescription for Jardiance, which is similar to [place], since his insurance would not cover the [place].  Please let me know if he has any questions or concerns.  If he has any issues getting this covered by insurance, please have him contact his insurance company and ask what medications are covered but are similar to this.

## 2018-03-23 ENCOUNTER — Ambulatory Visit: Payer: 59

## 2018-03-23 ENCOUNTER — Ambulatory Visit (INDEPENDENT_AMBULATORY_CARE_PROVIDER_SITE_OTHER): Payer: 59 | Admitting: Podiatry

## 2018-03-23 ENCOUNTER — Encounter: Payer: Self-pay | Admitting: Podiatry

## 2018-03-23 ENCOUNTER — Encounter: Payer: Self-pay | Admitting: Physician Assistant

## 2018-03-23 ENCOUNTER — Ambulatory Visit (INDEPENDENT_AMBULATORY_CARE_PROVIDER_SITE_OTHER): Payer: 59

## 2018-03-23 VITALS — BP 133/81 | HR 95 | Resp 16

## 2018-03-23 DIAGNOSIS — M79675 Pain in left toe(s): Secondary | ICD-10-CM | POA: Diagnosis not present

## 2018-03-23 DIAGNOSIS — M2041 Other hammer toe(s) (acquired), right foot: Secondary | ICD-10-CM

## 2018-03-23 DIAGNOSIS — L84 Corns and callosities: Secondary | ICD-10-CM

## 2018-03-23 DIAGNOSIS — I2510 Atherosclerotic heart disease of native coronary artery without angina pectoris: Secondary | ICD-10-CM | POA: Insufficient documentation

## 2018-03-23 DIAGNOSIS — B351 Tinea unguium: Secondary | ICD-10-CM | POA: Diagnosis not present

## 2018-03-23 DIAGNOSIS — E1140 Type 2 diabetes mellitus with diabetic neuropathy, unspecified: Secondary | ICD-10-CM | POA: Diagnosis not present

## 2018-03-23 DIAGNOSIS — M25571 Pain in right ankle and joints of right foot: Secondary | ICD-10-CM

## 2018-03-23 DIAGNOSIS — E1149 Type 2 diabetes mellitus with other diabetic neurological complication: Secondary | ICD-10-CM | POA: Diagnosis not present

## 2018-03-23 DIAGNOSIS — M79674 Pain in right toe(s): Secondary | ICD-10-CM

## 2018-03-23 NOTE — Progress Notes (Signed)
   Subjective:    Patient ID: Clifford Frederick, male    DOB: August 16, 1965, 52 y.o.   MRN: [id number]  HPI    Review of Systems  All other systems reviewed and are negative.      Objective:   Physical Exam        Assessment & Plan:

## 2018-03-23 NOTE — Telephone Encounter (Signed)
Called and informed pt of new Rx sent to pharmacy.

## 2018-03-23 NOTE — Progress Notes (Signed)
Subjective:   Patient ID: Clifford Frederick, male   DOB: 52 y.o.   MRN: [id number]   HPI Patient presents stating that he has a lot of pain in the outside of his right foot and his nails and he is a long-term diabetic who tries to take control of it and had a brother who lost the leg secondary to diabetes.  Patient smokes quarter pack per day and would like to be more active   Review of Systems  All other systems reviewed and are negative.       Objective:  Physical Exam  Constitutional: He appears well-developed and well-nourished.  Cardiovascular: Intact distal pulses.  Pulmonary/Chest: Effort normal.  Musculoskeletal: Normal range of motion.  Neurological: He is alert.  Skin: Skin is warm.  Nursing note and vitals reviewed.   Neurovascular status unchanged with patient found to have severe keratotic lesion sub-fifth metatarsal right thick yellow brittle nailbeds 1-5 both feet with diminishment of sharp dull vibratory bilateral.  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  Patient has good digital perfusion and is well oriented x3     Assessment:  At risk diabetic with thick yellow brittle nailbeds 1-5 both feet and lesion fifth metatarsal right     Plan:  H&P condition reviewed and today nail debridement accomplished with no iatrogenic bleeding and debrided lesion right with no iatrogenic bleeding and I discussed good diabetic foot care advised him on observations and patient will be seen back again for regular visit or earlier if any issues were to occur with his feet

## 2018-04-03 ENCOUNTER — Other Ambulatory Visit: Payer: Self-pay

## 2018-04-03 ENCOUNTER — Encounter (HOSPITAL_BASED_OUTPATIENT_CLINIC_OR_DEPARTMENT_OTHER): Payer: Self-pay

## 2018-04-03 ENCOUNTER — Emergency Department (HOSPITAL_BASED_OUTPATIENT_CLINIC_OR_DEPARTMENT_OTHER)
Admission: EM | Admit: 2018-04-03 | Discharge: 2018-04-03 | Disposition: A | Discharge status: Home / Self Care | Payer: 59 | Attending: Emergency Medicine | Admitting: Emergency Medicine

## 2018-04-03 ENCOUNTER — Emergency Department (HOSPITAL_BASED_OUTPATIENT_CLINIC_OR_DEPARTMENT_OTHER): Payer: 59

## 2018-04-03 DIAGNOSIS — I10 Essential (primary) hypertension: Secondary | ICD-10-CM | POA: Insufficient documentation

## 2018-04-03 DIAGNOSIS — Z7982 Long term (current) use of aspirin: Secondary | ICD-10-CM | POA: Diagnosis not present

## 2018-04-03 DIAGNOSIS — F17210 Nicotine dependence, cigarettes, uncomplicated: Secondary | ICD-10-CM | POA: Insufficient documentation

## 2018-04-03 DIAGNOSIS — R0602 Shortness of breath: Secondary | ICD-10-CM | POA: Diagnosis present

## 2018-04-03 DIAGNOSIS — I2510 Atherosclerotic heart disease of native coronary artery without angina pectoris: Secondary | ICD-10-CM | POA: Diagnosis not present

## 2018-04-03 DIAGNOSIS — R0789 Other chest pain: Secondary | ICD-10-CM

## 2018-04-03 DIAGNOSIS — Z79899 Other long term (current) drug therapy: Secondary | ICD-10-CM | POA: Diagnosis not present

## 2018-04-03 DIAGNOSIS — Z7984 Long term (current) use of oral hypoglycemic drugs: Secondary | ICD-10-CM | POA: Insufficient documentation

## 2018-04-03 DIAGNOSIS — E119 Type 2 diabetes mellitus without complications: Secondary | ICD-10-CM | POA: Diagnosis not present

## 2018-04-03 LAB — CBC
HCT: 37 % — ABNORMAL LOW (ref 39.0–52.0)
Hemoglobin: 12.1 g/dL — ABNORMAL LOW (ref 13.0–17.0)
MCH: 28.4 pg (ref 26.0–34.0)
MCHC: 32.7 g/dL (ref 30.0–36.0)
MCV: 86.9 fL (ref 80.0–100.0)
Platelets: 208 10*3/uL (ref 150–400)
RBC: 4.26 MIL/uL (ref 4.22–5.81)
RDW: 15.1 % (ref 11.5–15.5)
WBC: 9.8 10*3/uL (ref 4.0–10.5)
nRBC: 0 % (ref 0.0–0.2)

## 2018-04-03 LAB — BASIC METABOLIC PANEL
Anion gap: 12 (ref 5–15)
BUN: 17 mg/dL (ref 6–20)
CO2: 20 mmol/L — ABNORMAL LOW (ref 22–32)
Calcium: 9.4 mg/dL (ref 8.9–10.3)
Chloride: 105 mmol/L (ref 98–111)
Creatinine, Ser: 1.15 mg/dL (ref 0.61–1.24)
GFR calc Af Amer: >60 mL/min (ref >60)
GFR calc non Af Amer: >60 mL/min (ref >60)
Glucose, Bld: 110 mg/dL — ABNORMAL HIGH (ref 70–99)
Potassium: 3.4 mmol/L — ABNORMAL LOW (ref 3.5–5.1)
Sodium: 137 mmol/L (ref 135–145)

## 2018-04-03 LAB — TROPONIN I
Troponin I: <0.03 ng/mL (ref <0.03)
Troponin I: <0.03 ng/mL (ref <0.03)

## 2018-04-03 NOTE — ED Provider Notes (Signed)
I received this patient in signout from Dr. Adela Lank.  Patient with cardiac history presented with atypical symptoms, discussed with cardiology who recommended second troponin and if negative can be discharged to follow-up with outpatient cardiologist.  Second troponin was normal.  Discussed work-up findings and follow-up plan with patient.  Reviewed return precautions.  He voiced understanding.   Little, Ambrose Finland, MD 04/03/18 [phone]

## 2018-04-03 NOTE — ED Triage Notes (Signed)
C/o SOB started last night-states he has intermittent dry cough-states "feels like when I had a blocked artery"--NAD-steady gait

## 2018-04-03 NOTE — Discharge Instructions (Signed)
Return for worsening symptoms.  Follow up with your cardiologist.  

## 2018-04-03 NOTE — ED Notes (Signed)
Pt verbalizes understanding of d/c instructions and denies any further needs at this time. 

## 2018-04-03 NOTE — ED Provider Notes (Signed)
[facility] EMERGENCY DEPARTMENT Provider Note   CSN: [id number] Arrival date & time: 04/03/18  1233     History   Chief Complaint Chief Complaint  Patient presents with  . Shortness of Breath    HPI Clifford Frederick is a 52 y.o. male.  52 yo M with a cc of sob.  Going on for the past 3 days.  Seems to come and go.  Not exertional.  [place], has sob with it.  Denies trauma.  Denies n/v.  Denies worsening with eating.   The history is provided by the patient.  Shortness of Breath  This is a new problem. The average episode lasts 2 days. The problem occurs continuously.The current episode started yesterday. The problem has been rapidly worsening. Pertinent negatives include no fever, no headaches, no chest pain, no vomiting, no abdominal pain and no rash. He has tried nothing for the symptoms. The treatment provided no relief. He has had no prior hospitalizations.    Past Medical History:  Diagnosis Date  . Abnormal EKG   . Acute coronary syndrome (HCC) 08/15/2017  . CAD in native artery    A. LHC 08/16/17- DES to RCA  . Chest pain 08/15/2017  . Diabetes mellitus type 2 in obese (HCC) 08/15/2017  . Essential hypertension, benign 08/08/2012  . Fracture of lateral malleolus of left ankle 08/07/2012  . High cholesterol   . Hypertensive urgency 08/15/2017  . Sleep apnea   . Tobacco abuse 08/15/2017  . Unstable angina [facility])     Patient Active Problem List   Diagnosis Date Noted  . CAD in native artery   . Pure hypercholesterolemia   . Tobacco abuse 08/15/2017  . Diabetes mellitus type 2 in obese (HCC) 08/15/2017  . Normocytic anemia 08/15/2017  . Essential hypertension, benign 08/08/2012    Past Surgical History:  Procedure Laterality Date  . CORONARY STENT INTERVENTION N/A 08/16/2017   Procedure: CORONARY STENT INTERVENTION;  Surgeon: Corky Crafts, MD;  Location: [facility] INVASIVE CV LAB;  Service: Cardiovascular;  Laterality: N/A;  . LEFT HEART CATH AND CORONARY  ANGIOGRAPHY N/A 08/16/2017   Procedure: LEFT HEART CATH AND CORONARY ANGIOGRAPHY;  Surgeon: Corky Crafts, MD;  Location: [facility] INVASIVE CV LAB;  Service: Cardiovascular;  Laterality: N/A;  . ORIF ANKLE FRACTURE Left 08/07/2012   Procedure: OPEN REDUCTION INTERNAL FIXATION (ORIF) LEFT ANKLE FRACTURE;  Surgeon: Kathryne Hitch, MD;  Location: [facility] OR;  Service: Orthopedics;  Laterality: Left;        Home Medications    Prior to Admission medications   Medication Sig Start Date End Date Taking? Authorizing Provider  amLODipine (NORVASC) 5 MG tablet Take 1 tablet (5 mg total) by mouth daily. 08/17/17   Berton Bon, NP  aspirin 81 MG chewable tablet Chew 1 tablet (81 mg total) by mouth daily. 08/18/17   Berton Bon, NP  atorvastatin (LIPITOR) 80 MG tablet Take 1 tablet (80 mg total) by mouth daily at 6 PM. 08/17/17   Berton Bon, NP  carvedilol (COREG) 25 MG tablet Take 1 tablet (25 mg total) by mouth 2 (two) times daily. 08/31/17 11/29/17  Berton Bon, NP  cetirizine (ZYRTEC) 10 MG tablet Take 1 tablet (10 mg total) by mouth daily. 03/21/18   Benjiman Core D, PA-C  cyclobenzaprine (FLEXERIL) 5 MG tablet 1 pill by mouth up to every 8 hours as needed. Start with one pill by mouth each bedtime as needed due to sedation 11/14/17   Shade Flood, MD  empagliflozin (JARDIANCE) 10 MG TABS tablet Take 10 mg by mouth daily. 03/22/18   Benjiman Core D, PA-C  fluticasone (FLONASE) 50 MCG/ACT nasal spray Place 2 sprays into both nostrils daily. 03/21/18   Benjiman Core D, PA-C  lisinopril (PRINIVIL,ZESTRIL) 40 MG tablet Take 1 tablet (40 mg total) by mouth daily. 08/18/17   Berton Bon, NP  metFORMIN (GLUCOPHAGE) 1000 MG tablet Take 1 tablet (1,000 mg total) by mouth 2 (two) times daily with a meal. 09/07/17 09/07/18  Wallis Bamberg, PA-C  nitroGLYCERIN (NITROSTAT) 0.4 MG SL tablet Place 1 tablet (0.4 mg total) under the tongue every 5 (five) minutes as needed for chest pain.  08/17/17   Berton Bon, NP  predniSONE (DELTASONE) 20 MG tablet 3 by mouth for 3 days, then 2 by mouth for 2 days, then 1 by mouth for 2 days, then 1/2 by mouth for 2 days. 11/14/17   Shade Flood, MD  ticagrelor (BRILINTA) 90 MG TABS tablet Take 1 tablet (90 mg total) by mouth 2 (two) times daily. 08/17/17   Berton Bon, NP  traMADol (ULTRAM) 50 MG tablet Take 1 tablet (50 mg total) by mouth every 6 (six) hours as needed. 11/14/17   Shade Flood, MD    Family History Family History  Problem Relation Age of Onset  . Diabetes Mother   . Hypertension Mother   . Diabetes Brother   . Hypertension Brother     Social History Social History   Tobacco Use  . Smoking status: Current Some Day Smoker    Packs/day: 0.25    Years: 20.00    Pack years: 5.00    Types: Cigarettes    Start date: 08/16/2017  . Smokeless tobacco: Never Used  . Tobacco comment: currently smokes 3 cigarettes a day  Substance Use Topics  . Alcohol use: Yes    Alcohol/week: 6.0 standard drinks    Types: 6 Cans of beer per week    Comment: occ  . Drug use: No     Allergies   Patient has no known allergies.   Review of Systems Review of Systems  Constitutional: Negative for chills and fever.  HENT: Negative for congestion and facial swelling.   Eyes: Negative for discharge and visual disturbance.  Respiratory: Positive for shortness of breath.   Cardiovascular: Negative for chest pain and palpitations.  Gastrointestinal: Negative for abdominal pain, diarrhea and vomiting.  Musculoskeletal: Negative for arthralgias and myalgias.  Skin: Negative for color change and rash.  Neurological: Negative for tremors, syncope and headaches.  Psychiatric/Behavioral: Negative for confusion and dysphoric mood.     Physical Exam Updated Vital Signs BP 132/76   Pulse 77   Temp 98.4 F (36.9 C) (Oral)   Resp 10   Ht 5\' 10"  (1.778 m)   Wt 106.6 kg   SpO2 100%   BMI 33.72 kg/m   Physical Exam    Constitutional: He is oriented to person, place, and time. He appears well-developed and well-nourished.  HENT:  Head: Normocephalic and atraumatic.  Eyes: Pupils are equal, round, and reactive to light. EOM are normal.  Neck: Normal range of motion. Neck supple. No JVD present.  Cardiovascular: Normal rate and regular rhythm. Exam reveals no gallop and no friction rub.  No murmur heard. Pulmonary/Chest: No respiratory distress. He has no wheezes.  Abdominal: He exhibits no distension and no mass. There is no tenderness. There is no rebound and no guarding.  Musculoskeletal: Normal range of motion.  Neurological: He is  alert and oriented to person, place, and time.  Skin: No rash noted. No pallor.  Psychiatric: He has a normal mood and affect. His behavior is normal.  Nursing note and vitals reviewed.    ED Treatments / Results  Labs (all labs ordered are listed, but only abnormal results are displayed) Labs Reviewed  BASIC METABOLIC PANEL - Abnormal; Notable for the following components:      Result Value   Potassium 3.4 (*)    CO2 20 (*)    Glucose, Bld 110 (*)    All other components within normal limits  CBC - Abnormal; Notable for the following components:   Hemoglobin 12.1 (*)    HCT 37.0 (*)    All other components within normal limits  TROPONIN I  TROPONIN I    EKG EKG Interpretation  Date/Time:  Tuesday April 03 2018 12:53:56 EDT Ventricular Rate:  84 PR Interval:  154 QRS Duration: 84 QT Interval:  348 QTC Calculation: 411 R Axis:   47 Text Interpretation:  Normal sinus rhythm ST & T wave abnormality, consider inferolateral ischemia Abnormal ECG No significant change since last tracing Confirmed by Melene Plan [phone]) on 04/03/2018 1:03:00 PM Also confirmed by Melene Plan [id number]), editor Sheppard Evens ([id number])  on 04/04/2018 1:44:35 PM   Radiology Dg Chest 2 View  Result Date: 04/03/2018 CLINICAL DATA:  Shortness of breath and chest pain began today  while at work. History of hypertension, diabetes, coronary artery disease, current smoker. EXAM: CHEST - 2 VIEW COMPARISON:  PA and lateral chest x-ray dated August 15, 2017 FINDINGS: The lungs are adequately inflated. There is no focal infiltrate. There is no pleural effusion. The heart and pulmonary vascularity are normal. The mediastinum is normal in width. The trachea is midline. The bony thorax exhibits no acute abnormality. IMPRESSION: There is no pneumonia nor other acute cardiopulmonary abnormality. Electronically Signed   By: David  [place] M.D.   On: 04/03/2018 13:10    Procedures Procedures (including critical care time)  Medications Ordered in ED Medications - No data to display   Initial Impression / Assessment and Plan / ED Course  I have reviewed the triage vital signs and the nursing notes.  Pertinent labs & imaging results that were available during my care of the patient were reviewed by me and considered in my medical decision making (see chart for details).     52 yo M with a chief complaint of shortness of breath.  Seems to come and go.  He says this feels like the last time that he had a stent placed.  Symptoms are very atypical.  I discussed the case with Dr. Rennis Golden from cards, recommended delta trop and outpatient follow up.    Turned over to Dr. Clarene Duke, please see her note for further details of care.   The patients results and plan were reviewed and discussed.   Any x-rays performed were independently reviewed by myself.   Differential diagnosis were considered with the presenting HPI.  Medications - No data to display  Vitals:   04/03/18 1242 04/03/18 1500 04/03/18 1600  BP: 123/84 128/82 132/76  Pulse: 88 75 77  Resp: 20 16 10   Temp: 98.4 F (36.9 C)    TempSrc: Oral    SpO2: 100% 99% 100%  Weight: 106.6 kg    Height: 5\' 10"  (1.778 m)      Final diagnoses:  SOB (shortness of breath)  Atypical chest pain       Final Clinical  Impressions(s) /  ED Diagnoses   Final diagnoses:  SOB (shortness of breath)  Atypical chest pain    ED Discharge Orders    None       Melene Plan, DO 04/04/18 1502

## 2018-04-06 ENCOUNTER — Ambulatory Visit (INDEPENDENT_AMBULATORY_CARE_PROVIDER_SITE_OTHER): Payer: 59 | Admitting: Cardiology

## 2018-04-06 VITALS — BP 128/78 | HR 86 | Ht 70.0 in | Wt 237.0 lb

## 2018-04-06 DIAGNOSIS — I2510 Atherosclerotic heart disease of native coronary artery without angina pectoris: Secondary | ICD-10-CM | POA: Diagnosis not present

## 2018-04-06 DIAGNOSIS — E7800 Pure hypercholesterolemia, unspecified: Secondary | ICD-10-CM | POA: Diagnosis not present

## 2018-04-06 DIAGNOSIS — R079 Chest pain, unspecified: Secondary | ICD-10-CM

## 2018-04-06 DIAGNOSIS — I10 Essential (primary) hypertension: Secondary | ICD-10-CM

## 2018-04-06 DIAGNOSIS — Z720 Tobacco use: Secondary | ICD-10-CM

## 2018-04-06 NOTE — Progress Notes (Signed)
Cardiology Office Note:    Date:  04/09/2018   ID:  Clifford Frederick, DOB 04-24-1966, MRN [id number]  PCP:  Magdalene River, PA-C  Cardiologist:  Armanda Magic, MD    Referring MD: Magdalene River, PA*   Chief Complaint  Patient presents with  . Coronary Artery Disease  . Hypertension  . Hyperlipidemia    History of Present Illness:    Clifford Frederick is a 52 y.o. male with a hx of type 2 DM, hyperlipidemia, HTN, tobacco use and newly dx CAD last March.  He presented 08/2017 with complaints of sudden onset of SOB while smoking a cigarette at work.  He started to walk around and then developed chest tightness which resolved after sitting down.  He went to the ER where BP was markedly elevated at 190/140mmHg.  EKG showed inferolateral ST abnormality.  Trop was negative.  Cath showed  80% distal RCA and 25% mid to proximal left circumflex.  He is status post drug-eluting Synergy stent to the RCA.  He was discharged on high dose statin, amlodipine, BB and ACE I.  Since then he has done well with no further CP until the other day when he decided to smoke a cigarette and had the identical sx as in March.  He threw away the cigarette and sat down and pain resolved.  He went to the ER because his sx were identical to prior event with severe SOB and CP.  In ER trop was negative and EKG was unchanged.  He was discharged home and instructed to followup with Cardiology.  He has not had any CP since then.  He has been compliant with his meds.  Past Medical History:  Diagnosis Date  . Abnormal EKG   . Acute coronary syndrome (HCC) 08/15/2017  . CAD in native artery    A. LHC 08/16/17- DES to RCA  . Chest pain 08/15/2017  . Diabetes mellitus type 2 in obese (HCC) 08/15/2017  . Essential hypertension, benign 08/08/2012  . Fracture of lateral malleolus of left ankle 08/07/2012  . High cholesterol   . Hypertensive urgency 08/15/2017  . Sleep apnea   . Tobacco abuse 08/15/2017  . Unstable angina [facility])      Past Surgical History:  Procedure Laterality Date  . CORONARY STENT INTERVENTION N/A 08/16/2017   Procedure: CORONARY STENT INTERVENTION;  Surgeon: Corky Crafts, MD;  Location: [facility] INVASIVE CV LAB;  Service: Cardiovascular;  Laterality: N/A;  . LEFT HEART CATH AND CORONARY ANGIOGRAPHY N/A 08/16/2017   Procedure: LEFT HEART CATH AND CORONARY ANGIOGRAPHY;  Surgeon: Corky Crafts, MD;  Location: [facility] INVASIVE CV LAB;  Service: Cardiovascular;  Laterality: N/A;  . ORIF ANKLE FRACTURE Left 08/07/2012   Procedure: OPEN REDUCTION INTERNAL FIXATION (ORIF) LEFT ANKLE FRACTURE;  Surgeon: Kathryne Hitch, MD;  Location: [facility] OR;  Service: Orthopedics;  Laterality: Left;    Current Medications: Current Meds  Medication Sig  . amLODipine (NORVASC) 5 MG tablet Take 1 tablet (5 mg total) by mouth daily.  Marland Kitchen aspirin 81 MG chewable tablet Chew 1 tablet (81 mg total) by mouth daily.  Marland Kitchen atorvastatin (LIPITOR) 80 MG tablet Take 1 tablet (80 mg total) by mouth daily at 6 PM.  . cetirizine (ZYRTEC) 10 MG tablet Take 1 tablet (10 mg total) by mouth daily.  . cyclobenzaprine (FLEXERIL) 5 MG tablet 1 pill by mouth up to every 8 hours as needed. Start with one pill by mouth each bedtime as needed due to  sedation  . empagliflozin (JARDIANCE) 10 MG TABS tablet Take 10 mg by mouth daily.  . fluticasone (FLONASE) 50 MCG/ACT nasal spray Place 2 sprays into both nostrils daily.  Marland Kitchen lisinopril (PRINIVIL,ZESTRIL) 40 MG tablet Take 1 tablet (40 mg total) by mouth daily.  . metFORMIN (GLUCOPHAGE) 1000 MG tablet Take 1 tablet (1,000 mg total) by mouth 2 (two) times daily with a meal.  . nitroGLYCERIN (NITROSTAT) 0.4 MG SL tablet Place 1 tablet (0.4 mg total) under the tongue every 5 (five) minutes as needed for chest pain.  . predniSONE (DELTASONE) 20 MG tablet 3 by mouth for 3 days, then 2 by mouth for 2 days, then 1 by mouth for 2 days, then 1/2 by mouth for 2 days.  . ticagrelor (BRILINTA) 90 MG TABS tablet  Take 1 tablet (90 mg total) by mouth 2 (two) times daily.  . traMADol (ULTRAM) 50 MG tablet Take 1 tablet (50 mg total) by mouth every 6 (six) hours as needed.     Allergies:   Patient has no known allergies.   Social History   Socioeconomic History  . Marital status: Married    Spouse name: Not on file  . Number of children: Not on file  . Years of education: Not on file  . Highest education level: Not on file  Occupational History  . Not on file  Social Needs  . Financial resource strain: Not on file  . Food insecurity:    Worry: Not on file    Inability: Not on file  . Transportation needs:    Medical: Not on file    Non-medical: Not on file  Tobacco Use  . Smoking status: Current Some Day Smoker    Packs/day: 0.25    Years: 20.00    Pack years: 5.00    Types: Cigarettes    Start date: 08/16/2017  . Smokeless tobacco: Never Used  . Tobacco comment: currently smokes 3 cigarettes a day  Substance and Sexual Activity  . Alcohol use: Yes    Alcohol/week: 6.0 standard drinks    Types: 6 Cans of beer per week    Comment: occ  . Drug use: No  . Sexual activity: Not on file  Lifestyle  . Physical activity:    Days per week: Not on file    Minutes per session: Not on file  . Stress: Not on file  Relationships  . Social connections:    Talks on phone: Not on file    Gets together: Not on file    Attends religious service: Not on file    Active member of club or organization: Not on file    Attends meetings of clubs or organizations: Not on file    Relationship status: Not on file  Other Topics Concern  . Not on file  Social History Narrative  . Not on file     Family History: The patient's family history includes Diabetes in his brother and mother; Hypertension in his brother and mother.  ROS:   Please see the history of present illness.    ROS  All other systems reviewed and negative.   EKGs/Labs/Other Studies Reviewed:    The following studies were  reviewed today: ER records from recent visit  EKG:  EKG is not ordered today.   Recent Labs: 03/21/2018: ALT 29; TSH 1.550 04/03/2018: BUN 17; Creatinine, Ser 1.15; Hemoglobin 12.1; Platelets 208; Potassium 3.4; Sodium 137   Recent Lipid Panel    Component Value Date/Time  CHOL 107 03/21/2018 1123   TRIG 100 03/21/2018 1123   HDL 39 (L) 03/21/2018 1123   CHOLHDL 2.7 03/21/2018 1123   CHOLHDL 5.5 08/16/2017 0344   VLDL 57 (H) 08/16/2017 0344   LDLCALC 48 03/21/2018 1123    Physical Exam:    VS:  BP 128/78   Pulse 86   Ht 5\' 10"  (1.778 m)   Wt 237 lb (107.5 kg)   BMI 34.01 kg/m     Wt Readings from Last 3 Encounters:  04/06/18 237 lb (107.5 kg)  04/03/18 235 lb (106.6 kg)  03/21/18 239 lb 9.6 oz (108.7 kg)     GEN:  Well nourished, well developed in no acute distress HEENT: Normal NECK: No JVD; No carotid bruits LYMPHATICS: No lymphadenopathy CARDIAC: RRR, no murmurs, rubs, gallops RESPIRATORY:  Clear to auscultation without rales, wheezing or rhonchi  ABDOMEN: Soft, non-tender, non-distended MUSCULOSKELETAL:  No edema; No deformity  SKIN: Warm and dry NEUROLOGIC:  Alert and oriented x 3 PSYCHIATRIC:  Normal affect   ASSESSMENT:    1. Chest pain, unspecified type   2. Coronary artery disease involving native coronary artery of native heart without angina pectoris   3. Pure hypercholesterolemia   4. Essential hypertension, benign   5. Tobacco abuse    PLAN:    In order of problems listed above:  1.  Chest pain - his sx are very concerning for angina.  His sx prior to his MI a year ago occurred after smoking a cigarette and consisted of CP and SOB.  Sx identical this time.  Concerned about restenosis of distal RCA stent.  I have recommended a stress myoview to rule out ischemia.    2.  ASCAD - cath showed  80% distal RCA and 25% mid to proximal left circumflex.  He is status post drug-eluting Synergy stent to the RCA 08/16/2017.  He will continue on ASA 81mg   daily, Brilinta 90mg  BID, BB and statin.   3.  Hyperlipidemia -  LDL is at goal at 48 and HDL 39 with TAGs 100.  He will continue on Atorvastatin 80mg  daily.    4.  HTN - BP is controlled on current meds.  He will continue on amlodipine 5mg  daily, Lisinopril 40mg  daily and Carvedilol 25mg  BID.    5.  Tobacco abuse counseling - I have encouraged him to completely get off cigarettes. He tells me that after this last episode of CP and SOB he has completely stopped.  I encouraged him not to go back on tobacco.   Medication Adjustments/Labs and Tests Ordered: Current medicines are reviewed at length with the patient today.  Concerns regarding medicines are outlined above.  Orders Placed This Encounter  Procedures  . MYOCARDIAL PERFUSION IMAGING   No orders of the defined types were placed in this encounter.   Signed, Armanda Magic, MD  04/09/2018 5:53 PM    [facility]

## 2018-04-06 NOTE — Patient Instructions (Signed)
Medication Instructions:  Your physician recommends that you continue on your current medications as directed. Please refer to the Current Medication list given to you today.  If you need a refill on your cardiac medications before your next appointment, please call your pharmacy.   Lab work: None Ordered  If you have labs (blood work) drawn today and your tests are completely normal, you will receive your results only by: Marland Kitchen MyChart Message (if you have MyChart) OR . A paper copy in the mail If you have any lab test that is abnormal or we need to change your treatment, we will call you to review the results.  Testing/Procedures: Your physician has requested that you have en exercise stress myoview. For further information please visit [email or web address] Please follow instruction sheet, as given.  Follow-Up: At [facility], you and your health needs are our priority.  As part of our continuing mission to provide you with exceptional heart care, we have created designated Provider Care Teams.  These Care Teams include your primary Cardiologist (physician) and Advanced Practice Providers (APPs -  Physician Assistants and Nurse Practitioners) who all work together to provide you with the care you need, when you need it. . You will need a follow up appointment in 1 year.  Please call our office 2 months in advance to schedule this appointment.  You may see Armanda Magic, MD or one of the following Advanced Practice Providers on your designated Care Team:   . Robbie Lis, PA-C . Dayna Dunn, PA-C . Jacolyn Reedy, PA-C  Any Other Special Instructions Will Be Listed Below (If Applicable).  Cardiac Nuclear Scan A cardiac nuclear scan is a test that measures blood flow to the heart when a person is resting and when he or she is exercising. The test looks for problems such as:  Not enough blood reaching a portion of the heart.  The heart muscle not working normally.  You may need this  test if:  You have heart disease.  You have had abnormal lab results.  You have had heart surgery or angioplasty.  You have chest pain.  You have shortness of breath.  In this test, a radioactive dye (tracer) is injected into your bloodstream. After the tracer has traveled to your heart, an imaging device is used to measure how much of the tracer is absorbed by or distributed to various areas of your heart. This procedure is usually done at a hospital and takes 2-4 hours. Tell a health care provider about:  Any allergies you have.  All medicines you are taking, including vitamins, herbs, eye drops, creams, and over-the-counter medicines.  Any problems you or family members have had with the use of anesthetic medicines.  Any blood disorders you have.  Any surgeries you have had.  Any medical conditions you have.  Whether you are pregnant or may be pregnant. What are the risks? Generally, this is a safe procedure. However, problems may occur, including:  Serious chest pain and heart attack. This is only a risk if the stress portion of the test is done.  Rapid heartbeat.  Sensation of warmth in your chest. This usually passes quickly.  What happens before the procedure?  Ask your health care provider about changing or stopping your regular medicines. This is especially important if you are taking diabetes medicines or blood thinners.  Remove your jewelry on the day of the procedure. What happens during the procedure?  An IV tube will be inserted into one of  your veins.  Your health care provider will inject a small amount of radioactive tracer through the tube.  You will wait for 20-40 minutes while the tracer travels through your bloodstream.  Your heart activity will be monitored with an electrocardiogram (ECG).  You will lie down on an exam table.  Images of your heart will be taken for about 15-20 minutes.  You may be asked to exercise on a treadmill or  stationary bike. While you exercise, your heart's activity will be monitored with an ECG, and your blood pressure will be checked. If you are unable to exercise, you may be given a medicine to increase blood flow to parts of your heart.  When blood flow to your heart has peaked, a tracer will again be injected through the IV tube.  After 20-40 minutes, you will get back on the exam table and have more images taken of your heart.  When the procedure is over, your IV tube will be removed. The procedure may vary among health care providers and hospitals. Depending on the type of tracer used, scans may need to be repeated 3-4 hours later. What happens after the procedure?  Unless your health care provider tells you otherwise, you may return to your normal schedule, including diet, activities, and medicines.  Unless your health care provider tells you otherwise, you may increase your fluid intake. This will help flush the contrast dye from your body. Drink enough fluid to keep your urine clear or pale yellow.  It is up to you to get your test results. Ask your health care provider, or the department that is doing the test, when your results will be ready. Summary  A cardiac nuclear scan measures the blood flow to the heart when a person is resting and when he or she is exercising.  You may need this test if you are at risk for heart disease.  Tell your health care provider if you are pregnant.  Unless your health care provider tells you otherwise, increase your fluid intake. This will help flush the contrast dye from your body. Drink enough fluid to keep your urine clear or pale yellow. This information is not intended to replace advice given to you by your health care provider. Make sure you discuss any questions you have with your health care provider. Document Released: 06/17/2004 Document Revised: 05/25/2016 Document Reviewed: 05/01/2013 Elsevier Interactive Patient Education  2017 [employer].

## 2018-04-16 ENCOUNTER — Telehealth (HOSPITAL_COMMUNITY): Payer: Self-pay | Admitting: *Deleted

## 2018-04-16 NOTE — Telephone Encounter (Signed)
Patient given detailed instructions per Myocardial Perfusion Study Information Sheet for the test on 04/18/18 at 8:00. Patient notified to arrive 15 minutes early and that it is imperative to arrive on time for appointment to keep from having the test rescheduled.  If you need to cancel or reschedule your appointment, please call the office within 24 hours of your appointment. . Patient verbalized understanding.[name]

## 2018-04-18 ENCOUNTER — Ambulatory Visit (HOSPITAL_COMMUNITY): Payer: 59 | Attending: Cardiovascular Disease

## 2018-04-18 DIAGNOSIS — R079 Chest pain, unspecified: Secondary | ICD-10-CM

## 2018-04-18 LAB — MYOCARDIAL PERFUSION IMAGING
Estimated workload: 10.1 METS
Exercise duration (min): 9 min
Exercise duration (sec): 15 s
LV dias vol: 90 mL (ref 62–150)
LV sys vol: 45 mL
MPHR: 168 {beats}/min
Peak HR: 150 {beats}/min
Percent HR: 89 %
RPE: 19
Rest HR: 78 {beats}/min
SDS: 0
SRS: 0
SSS: 0
TID: 0.98

## 2018-04-18 MED ORDER — TECHNETIUM TC 99M TETROFOSMIN IV KIT
33.0000 | PACK | Freq: Once | INTRAVENOUS | Status: AC | PRN
  Administered 2018-04-18: 33 via INTRAVENOUS
  Filled 2018-04-18: qty 33

## 2018-04-18 MED ORDER — TECHNETIUM TC 99M TETROFOSMIN IV KIT
10.4000 | PACK | Freq: Once | INTRAVENOUS | Status: AC | PRN
  Administered 2018-04-18: 10.4 via INTRAVENOUS
  Filled 2018-04-18: qty 11

## 2018-04-30 ENCOUNTER — Telehealth: Payer: Self-pay | Admitting: Physician Assistant

## 2018-04-30 NOTE — Telephone Encounter (Signed)
Patient is requesting an alternative medication: Farxiga 5 mg: too expensive.   Needs new medication- he is out now.

## 2018-04-30 NOTE — Telephone Encounter (Signed)
Copied from CRM [id number]. Topic: Quick Communication - Rx Refill/Question >> Apr 30, 2018 10:10 AM Mickel BaasMcGee, Kalani Sthilaire B, NT wrote: **Patient calling and states that he cannot afford this medication any longer. States that is it going to be $500. Would like to know if there is another medication that could be taken in place. States that he is out of this medication. **  Medication: dapagliflozin propanediol (FARXIGA) 5 MG TABS tablet [[id number]][[id number]]  DISCONTINUED   Has the patient contacted their pharmacy? Yes.   (Agent: If no, request that the patient contact the pharmacy for the refill.) (Agent: If yes, when and what did the pharmacy advise?)  Preferred Pharmacy (with phone number or street name): [id number] - [place] ([place]), [place] - [address]  Agent: Please be advised that RX refills may take up to 3 business days. We ask that you follow-up with your pharmacy.

## 2018-05-01 NOTE — Telephone Encounter (Signed)
Please clarify with patient. I see that he is on jardiance. Also these meds have saving cards, coupons that he can use. Please help patient navigate this system. thanks

## 2018-05-07 MED ORDER — EMPAGLIFLOZIN 10 MG PO TABS: 10.0000 mg | ORAL_TABLET | Freq: Every day | ORAL | 0 refills | Status: DC

## 2018-05-07 NOTE — Telephone Encounter (Signed)
Per Brittany's note on 03/21/18 it doesn't mention Clifford DeistFarxiga as a medication. It does say pt may benefit from SGLT2 inhibitor. Pt is currently taking Jardiance 10 mg daily. He stated that he is not sure if she wanted him to take [place]. pls advise. thanks

## 2018-05-07 NOTE — Addendum Note (Signed)
Addended by: Myles LippsSANTIAGO, Shamari Lofquist M on: 05/07/2018 12:48 PM   Modules accepted: Orders

## 2018-06-04 ENCOUNTER — Other Ambulatory Visit: Payer: Self-pay

## 2018-06-04 ENCOUNTER — Ambulatory Visit: Payer: No Typology Code available for payment source | Admitting: Family Medicine

## 2018-06-04 ENCOUNTER — Encounter: Payer: Self-pay | Admitting: Family Medicine

## 2018-06-04 VITALS — BP 109/75 | HR 98 | Temp 98.1°F | Ht 70.0 in | Wt 226.6 lb

## 2018-06-04 DIAGNOSIS — R112 Nausea with vomiting, unspecified: Secondary | ICD-10-CM

## 2018-06-04 DIAGNOSIS — R197 Diarrhea, unspecified: Secondary | ICD-10-CM

## 2018-06-04 DIAGNOSIS — R6889 Other general symptoms and signs: Secondary | ICD-10-CM | POA: Diagnosis not present

## 2018-06-04 DIAGNOSIS — B349 Viral infection, unspecified: Secondary | ICD-10-CM | POA: Diagnosis not present

## 2018-06-04 DIAGNOSIS — E1165 Type 2 diabetes mellitus with hyperglycemia: Secondary | ICD-10-CM

## 2018-06-04 LAB — POC INFLUENZA A&B (BINAX/QUICKVUE)
Influenza A, POC: NEGATIVE
Influenza B, POC: NEGATIVE

## 2018-06-04 MED ORDER — BLOOD GLUCOSE METER KIT: PACK | 0 refills | Status: AC

## 2018-06-04 NOTE — Patient Instructions (Addendum)
  Drink plenty of fluids get enough rest  Stay off work through tomorrow  Return if needed  I recommend you see Clifford Frederick to follow-up on your diabetes in about February.  I have prescribed for your new glucose meter.   If you have lab work done today you will be contacted with your lab results within the next 2 weeks.  If you have not heard from us then please contact us. The fastest way to get your results is to register for My Chart.   IF you received an x-ray today, you will receive an invoice from [facility]. Please contact [facility] at [phone] with questions or concerns regarding your invoice.   IF you received labwork today, you will receive an invoice from [facility]. Please contact [facility] at [phone] with questions or concerns regarding your invoice.   Our billing staff will not be able to assist you with questions regarding bills from these companies.  You will be contacted with the lab results as soon as they are available. The fastest way to get your results is to activate your My Chart account. Instructions are located on the last page of this paperwork. If you have not heard from us regarding the results in 2 weeks, please contact this office.

## 2018-06-04 NOTE — Progress Notes (Signed)
Patient ID: Clifford Frederick, male    DOB: 1966-03-27  Age: 52 y.o. MRN: [id number]  Chief Complaint  Patient presents with  . Emesis    with chills/hot, Using gatorade, pedialite. Not much of an apppetitie  . Diarrhea    Subjective:   52 year old man who got sick on Friday.  He developed diarrhea and vomiting through the night.  He used Gatorade and Pedialyte.  The diarrhea and vomiting have subsided.  He did have aching across his chest, especially if he coughed or sneezed.  He did not document fevers.  He is diabetic but does not have a functional glucose meter.  This morning he still felt bad but is able to eat a little breakfast.  He works as a Freight forwarder, outside.  Current allergies, medications, problem list, past/family and social histories reviewed.  Objective:  BP 109/75 (BP Location: Left Arm, Patient Position: Sitting, Cuff Size: Normal)   Pulse 98   Temp 98.1 F (36.7 C) (Oral)   Ht 5' 10" (1.778 m)   Wt 226 lb 9.6 oz (102.8 kg)   SpO2 99%   BMI 32.51 kg/m   No major distress.  Alert and oriented.  TMs normal.  Throat clear.  Neck supple without nodes.  Chest clear to auscultation.  Heart rate without murmurs.  Abdomen has normal bowel sounds, soft without masses  Assessment & Plan:   Assessment: 1. Flu-like symptoms   2. Acute viral syndrome   3. Diarrhea of presumed infectious origin   4. Nausea and vomiting, intractability of vomiting not specified, unspecified vomiting type   5. Type 2 diabetes mellitus with hyperglycemia, without long-term current use of insulin ([place])       Plan: Leave him off work through tomorrow to let him recuperate his energy and get things back to normal since he is a diabetic and has had this viral illness.  Orders Placed This Encounter  Procedures  . POC Influenza A&B(BINAX/QUICKVUE)    Meds ordered this encounter  Medications  . blood glucose meter kit and supplies    Sig: Dispense based on patient and insurance  preference. Use up to four times daily as directed. (FOR ICD-10 E10.9, E11.9).    Dispense:  1 each    Refill:  0    Order Specific Question:   Number of strips    Answer:   100    Order Specific Question:   Number of lancets    Answer:   100         Patient Instructions    Drink plenty of fluids get enough rest  Stay off work through tomorrow  Return if needed  I recommend you see Garvin Fila to follow-up on your diabetes in about February.  I have prescribed for your new glucose meter.   If you have lab work done today you will be contacted with your lab results within the next 2 weeks.  If you have not heard from [place] then please contact us. The fastest way to get your results is to register for My Chart.   IF you received an x-ray today, you will receive an invoice from [facility]. Please contact [facility] at [phone] with questions or concerns regarding your invoice.   IF you received labwork today, you will receive an invoice from [facility]. Please contact [facility] at [phone] with questions or concerns regarding your invoice.   Our billing staff will not be able to assist you with questions regarding bills from  these companies.  You will be contacted with the lab results as soon as they are available. The fastest way to get your results is to activate your My Chart account. Instructions are located on the last page of this paperwork. If you have not heard from [place] regarding the results in 2 weeks, please contact this office.        Return in about 6 weeks (around 07/16/2018), or Timmothy Euler, for diabetic check up.   Ruben Reason, MD 06/04/2018

## 2018-06-22 ENCOUNTER — Ambulatory Visit: Payer: No Typology Code available for payment source | Admitting: Podiatry

## 2018-07-10 ENCOUNTER — Telehealth: Payer: Self-pay | Admitting: Emergency Medicine

## 2018-07-10 NOTE — Telephone Encounter (Signed)
Copied from CRM [id number]. Topic: Quick Communication - Rx Refill/Question >> Jul 10, 2018  4:51 PM Lyn Hollingshead, [facility] wrote: Medication: ticagrelor (BRILINTA) 90 MG TABS tablet  Medication is too expensive and not covered by insurance.  Pt is completely out and would like to know what he needs to do.  Has the patient contacted their pharmacy? Yes - medication too expensive (Agent: If no, request that the patient contact the pharmacy for the refill.) (Agent: If yes, when and what did the pharmacy advise?)  Preferred Pharmacy (with phone number or street name): [facility] [address] ([address]), [place] - [address] [phone] (Phone) [phone] (Fax)  Agent: Please be advised that RX refills may take up to 3 business days. We ask that you follow-up with your pharmacy.

## 2018-07-10 NOTE — Telephone Encounter (Signed)
Medication: ticagrelor (BRILINTA) 90 MG TABS tablet  Medication is too expensive and not covered by insurance.  Pt is completely out. Requesting advise, alternate medication.   [facility] [address] ([address]), [place] - [address]        [phone] (Phone) [phone] (Fax)

## 2018-07-11 NOTE — Telephone Encounter (Signed)
Do not know this patient at all.  Please send to Dr. Neva Seat.  Thanks.

## 2018-07-11 NOTE — Telephone Encounter (Signed)
Please advise 

## 2018-07-12 ENCOUNTER — Telehealth: Payer: Self-pay | Admitting: Cardiology

## 2018-07-12 NOTE — Telephone Encounter (Signed)
Pt c/o medication issue:  1. Name of Medication: ticagrelor (BRILINTA) 90 MG TABS tablet  2. How are you currently taking this medication (dosage and times per day)? Pt has not taken medication in 3 days  3. Are you having a reaction (difficulty breathing--STAT)? No   4. What is your medication issue? Too expensive. Insurance no longer covers it  Pt would like a cheaper medication

## 2018-07-12 NOTE — Telephone Encounter (Signed)
Dr. Mayford Knife and her nurse Romeo Apple is aware of this matter and Dr. Mayford Knife has already address this issue. Thanks

## 2018-07-12 NOTE — Telephone Encounter (Signed)
Patient calling the office for samples of medication:   1.  What medication and dosage are you requesting samples for?  ticagrelor (BRILINTA) 90 MG TABS tablet 2.  Are you currently out of this  medication? Yes. Out for 3 days

## 2018-07-12 NOTE — Telephone Encounter (Signed)
Left the patient a voicemail to call back.  

## 2018-07-12 NOTE — Telephone Encounter (Signed)
Change to Plavix 75mg daily 

## 2018-07-13 MED ORDER — CLOPIDOGREL BISULFATE 75 MG PO TABS: 75.0000 mg | ORAL_TABLET | Freq: Every day | ORAL | 3 refills | Status: DC

## 2018-07-13 NOTE — Telephone Encounter (Signed)
Pt called back and I explained to pt per Dr. Mayford Knife that pt has been prescribed clopidogrel (plavix) 75 mg tablet, in place of Brilinta, because Brilinta was too expensive for pt to get. I informed pt that his new medication was already at his pharmacy for him to pick up and if he has any other problems, questions or concerns to give our office a call back. Pt verbalized understanding and wanted to thank Dr. Mayford Knife for taking care of the matter. FYI

## 2018-07-13 NOTE — Telephone Encounter (Signed)
Spoke with the patient, he expressed understanding about his medication and had no further questions.

## 2018-07-16 NOTE — Telephone Encounter (Signed)
Please advise 

## 2018-07-17 NOTE — Telephone Encounter (Signed)
This has been handled by patient's cardiologist on 07/12/18.

## 2018-07-18 ENCOUNTER — Other Ambulatory Visit: Payer: Self-pay

## 2018-07-18 ENCOUNTER — Encounter: Payer: Self-pay | Admitting: Emergency Medicine

## 2018-07-18 ENCOUNTER — Ambulatory Visit: Payer: No Typology Code available for payment source | Admitting: Emergency Medicine

## 2018-07-18 VITALS — BP 128/79 | HR 85 | Temp 98.5°F | Resp 16 | Ht 71.0 in | Wt 226.4 lb

## 2018-07-18 DIAGNOSIS — I10 Essential (primary) hypertension: Secondary | ICD-10-CM

## 2018-07-18 DIAGNOSIS — E7800 Pure hypercholesterolemia, unspecified: Secondary | ICD-10-CM

## 2018-07-18 DIAGNOSIS — E1165 Type 2 diabetes mellitus with hyperglycemia: Secondary | ICD-10-CM

## 2018-07-18 DIAGNOSIS — I2510 Atherosclerotic heart disease of native coronary artery without angina pectoris: Secondary | ICD-10-CM

## 2018-07-18 DIAGNOSIS — Z7689 Persons encountering health services in other specified circumstances: Secondary | ICD-10-CM

## 2018-07-18 LAB — POCT GLYCOSYLATED HEMOGLOBIN (HGB A1C): Hemoglobin A1C: 6.6 % — AB (ref 4.0–5.6)

## 2018-07-18 LAB — GLUCOSE, POCT (MANUAL RESULT ENTRY): POC Glucose: 140 mg/dl — AB (ref 70–99)

## 2018-07-18 NOTE — Patient Instructions (Addendum)
   If you have lab work done today you will be contacted with your lab results within the next 2 weeks.  If you have not heard from us then please contact us. The fastest way to get your results is to register for My Chart.   IF you received an x-ray today, you will receive an invoice from [facility]. Please contact [facility] at [phone] with questions or concerns regarding your invoice.   IF you received labwork today, you will receive an invoice from [facility]. Please contact [facility] at [phone] with questions or concerns regarding your invoice.   Our billing staff will not be able to assist you with questions regarding bills from these companies.  You will be contacted with the lab results as soon as they are available. The fastest way to get your results is to activate your My Chart account. Instructions are located on the last page of this paperwork. If you have not heard from us regarding the results in 2 weeks, please contact this office.     Diabetes Mellitus and Nutrition, Adult When you have diabetes (diabetes mellitus), it is very important to have healthy eating habits because your blood sugar (glucose) levels are greatly affected by what you eat and drink. Eating healthy foods in the appropriate amounts, at about the same times every day, can help you:  Control your blood glucose.  Lower your risk of heart disease.  Improve your blood pressure.  Reach or maintain a healthy weight. Every person with diabetes is different, and each person has different needs for a meal plan. Your health care provider may recommend that you work with a diet and nutrition specialist (dietitian) to make a meal plan that is best for you. Your meal plan may vary depending on factors such as:  The calories you need.  The medicines you take.  Your weight.  Your blood glucose, blood pressure, and cholesterol levels.  Your activity level.  Other health conditions  you have, such as heart or kidney disease. How do carbohydrates affect me? Carbohydrates, also called carbs, affect your blood glucose level more than any other type of food. Eating carbs naturally raises the amount of glucose in your blood. Carb counting is a method for keeping track of how many carbs you eat. Counting carbs is important to keep your blood glucose at a healthy level, especially if you use insulin or take certain oral diabetes medicines. It is important to know how many carbs you can safely have in each meal. This is different for every person. Your dietitian can help you calculate how many carbs you should have at each meal and for each snack. Foods that contain carbs include:  Bread, cereal, rice, pasta, and crackers.  Potatoes and corn.  Peas, beans, and lentils.  Milk and yogurt.  Fruit and juice.  Desserts, such as cakes, cookies, ice cream, and candy. How does alcohol affect me? Alcohol can cause a sudden decrease in blood glucose (hypoglycemia), especially if you use insulin or take certain oral diabetes medicines. Hypoglycemia can be a life-threatening condition. Symptoms of hypoglycemia (sleepiness, dizziness, and confusion) are similar to symptoms of having too much alcohol. If your health care provider says that alcohol is safe for you, follow these guidelines:  Limit alcohol intake to no more than 1 drink per day for nonpregnant women and 2 drinks per day for men. One drink equals 12 oz of beer, 5 oz of wine, or 1 oz of hard liquor.    Do not drink on an empty stomach.  Keep yourself hydrated with water, diet soda, or unsweetened iced tea.  Keep in mind that regular soda, juice, and other mixers may contain a lot of sugar and must be counted as carbs. What are tips for following this plan?  Reading food labels  Start by checking the serving size on the "Nutrition Facts" label of packaged foods and drinks. The amount of calories, carbs, fats, and other  nutrients listed on the label is based on one serving of the item. Many items contain more than one serving per package.  Check the total grams (g) of carbs in one serving. You can calculate the number of servings of carbs in one serving by dividing the total carbs by 15. For example, if a food has 30 g of total carbs, it would be equal to 2 servings of carbs.  Check the number of grams (g) of saturated and trans fats in one serving. Choose foods that have low or no amount of these fats.  Check the number of milligrams (mg) of salt (sodium) in one serving. Most people should limit total sodium intake to less than 2,300 mg per day.  Always check the nutrition information of foods labeled as "low-fat" or "nonfat". These foods may be higher in added sugar or refined carbs and should be avoided.  Talk to your dietitian to identify your daily goals for nutrients listed on the label. Shopping  Avoid buying canned, premade, or processed foods. These foods tend to be high in fat, sodium, and added sugar.  Shop around the outside edge of the grocery store. This includes fresh fruits and vegetables, bulk grains, fresh meats, and fresh dairy. Cooking  Use low-heat cooking methods, such as baking, instead of high-heat cooking methods like deep frying.  Cook using healthy oils, such as olive, canola, or sunflower oil.  Avoid cooking with butter, cream, or high-fat meats. Meal planning  Eat meals and snacks regularly, preferably at the same times every day. Avoid going long periods of time without eating.  Eat foods high in fiber, such as fresh fruits, vegetables, beans, and whole grains. Talk to your dietitian about how many servings of carbs you can eat at each meal.  Eat 4-6 ounces (oz) of lean protein each day, such as lean meat, chicken, fish, eggs, or tofu. One oz of lean protein is equal to: ? 1 oz of meat, chicken, or fish. ? 1 egg. ?  cup of tofu.  Eat some foods each day that contain  healthy fats, such as avocado, nuts, seeds, and fish. Lifestyle  Check your blood glucose regularly.  Exercise regularly as told by your health care provider. This may include: ? 150 minutes of moderate-intensity or vigorous-intensity exercise each week. This could be brisk walking, biking, or water aerobics. ? Stretching and doing strength exercises, such as yoga or weightlifting, at least 2 times a week.  Take medicines as told by your health care provider.  Do not use any products that contain nicotine or tobacco, such as cigarettes and e-cigarettes. If you need help quitting, ask your health care provider.  Work with a counselor or diabetes educator to identify strategies to manage stress and any emotional and social challenges. Questions to ask a health care provider  Do I need to meet with a diabetes educator?  Do I need to meet with a dietitian?  What number can I call if I have questions?  When are the best times to   check my blood glucose? Where to find more information:  American Diabetes Association: diabetes.org  Academy of Nutrition and Dietetics: www.eatright.org  National Institute of Diabetes and Digestive and Kidney Diseases (NIH): www.niddk.nih.gov Summary  A healthy meal plan will help you control your blood glucose and maintain a healthy lifestyle.  Working with a diet and nutrition specialist (dietitian) can help you make a meal plan that is best for you.  Keep in mind that carbohydrates (carbs) and alcohol have immediate effects on your blood glucose levels. It is important to count carbs and to use alcohol carefully. This information is not intended to replace advice given to you by your health care provider. Make sure you discuss any questions you have with your health care provider. Document Released: 02/17/2005 Document Revised: 12/21/2016 Document Reviewed: 06/27/2016 Elsevier Interactive Patient Education  2019 [employer]  

## 2018-07-18 NOTE — Assessment & Plan Note (Signed)
Well-controlled hypertension.  Continue present medications and follow-up in 6 months.

## 2018-07-18 NOTE — Progress Notes (Signed)
Lab Results  Component Value Date   HGBA1C 7.5 (A) 03/21/2018   BP Readings from Last 3 Encounters:  07/18/18 128/79  06/04/18 109/75  04/06/18 128/78   Clifford Frederick 53 y.o.   Chief Complaint  Patient presents with  . Establish Care  . Diabetes    follow up 6 months    HISTORY OF PRESENT ILLNESS: This is a 53 y.o. male here to establish care with me.  Has a history of hypertension and diabetes.  Here for follow-up.  Compliant with medications. Also has a history of coronary artery disease.  On Plavix and baby aspirin daily.  Stable. Doing well.  Has no complaints or medical concerns today.  HPI   Prior to Admission medications   Medication Sig Start Date End Date Taking? Authorizing Provider  amLODipine (NORVASC) 5 MG tablet Take 1 tablet (5 mg total) by mouth daily. 08/17/17  Yes Daune Perch, NP  aspirin 81 MG chewable tablet Chew 1 tablet (81 mg total) by mouth daily. 08/18/17  Yes Daune Perch, NP  atorvastatin (LIPITOR) 80 MG tablet Take 1 tablet (80 mg total) by mouth daily at 6 PM. 08/17/17  Yes Daune Perch, NP  blood glucose meter kit and supplies Dispense based on patient and insurance preference. Use up to four times daily as directed. (FOR ICD-10 E10.9, E11.9). 06/04/18  Yes Posey Boyer, MD  clopidogrel (PLAVIX) 75 MG tablet Take 1 tablet (75 mg total) by mouth daily. 07/13/18 07/13/19 Yes Turner, Eber Hong, MD  empagliflozin (JARDIANCE) 10 MG TABS tablet Take 10 mg by mouth daily. 05/07/18  Yes Rutherford Guys, MD  lisinopril (PRINIVIL,ZESTRIL) 40 MG tablet Take 1 tablet (40 mg total) by mouth daily. 08/18/17  Yes Daune Perch, NP  metFORMIN (GLUCOPHAGE) 1000 MG tablet Take 1 tablet (1,000 mg total) by mouth 2 (two) times daily with a meal. 09/07/17 09/07/18 Yes Jaynee Eagles, PA-C  nitroGLYCERIN (NITROSTAT) 0.4 MG SL tablet Place 1 tablet (0.4 mg total) under the tongue every 5 (five) minutes as needed for chest pain. 08/17/17  Yes Daune Perch, NP  carvedilol  (COREG) 25 MG tablet Take 1 tablet (25 mg total) by mouth 2 (two) times daily. 08/31/17 11/29/17  Daune Perch, NP  cetirizine (ZYRTEC) 10 MG tablet Take 1 tablet (10 mg total) by mouth daily. Patient not taking: Reported on 07/18/2018 03/21/18   Tenna Delaine D, PA-C  cyclobenzaprine (FLEXERIL) 5 MG tablet 1 pill by mouth up to every 8 hours as needed. Start with one pill by mouth each bedtime as needed due to sedation Patient not taking: Reported on 07/18/2018 11/14/17   Wendie Agreste, MD  fluticasone [facility]) 50 MCG/ACT nasal spray Place 2 sprays into both nostrils daily. Patient not taking: Reported on 07/18/2018 03/21/18   Tenna Delaine D, PA-C  predniSONE (DELTASONE) 20 MG tablet 3 by mouth for 3 days, then 2 by mouth for 2 days, then 1 by mouth for 2 days, then 1/2 by mouth for 2 days. Patient not taking: Reported on 07/18/2018 11/14/17   Wendie Agreste, MD  traMADol (ULTRAM) 50 MG tablet Take 1 tablet (50 mg total) by mouth every 6 (six) hours as needed. Patient not taking: Reported on 07/18/2018 11/14/17   Wendie Agreste, MD    No Known Allergies  Patient Active Problem List   Diagnosis Date Noted  . CAD in native artery   . Pure hypercholesterolemia   . Tobacco abuse 08/15/2017  . Diabetes mellitus type 2  in obese ([place]) 08/15/2017  . Normocytic anemia 08/15/2017  . Essential hypertension, benign 08/08/2012    Past Medical History:  Diagnosis Date  . Abnormal EKG   . Acute coronary syndrome ([place]) 08/15/2017  . CAD in native artery    A. LHC 08/16/17- DES to RCA  . Chest pain 08/15/2017  . Diabetes mellitus type 2 in obese ([place]) 08/15/2017  . Essential hypertension, benign 08/08/2012  . Fracture of lateral malleolus of left ankle 08/07/2012  . High cholesterol   . Hypertensive urgency 08/15/2017  . Sleep apnea   . Tobacco abuse 08/15/2017  . Unstable angina [facility])     Past Surgical History:  Procedure Laterality Date  . CORONARY STENT INTERVENTION N/A 08/16/2017    Procedure: CORONARY STENT INTERVENTION;  Surgeon: Jettie Booze, MD;  Location: [place] [facility];  Service: Cardiovascular;  Laterality: N/A;  . LEFT HEART CATH AND CORONARY ANGIOGRAPHY N/A 08/16/2017   Procedure: LEFT HEART CATH AND CORONARY ANGIOGRAPHY;  Surgeon: Jettie Booze, MD;  Location: [place] [facility];  Service: Cardiovascular;  Laterality: N/A;  . ORIF ANKLE FRACTURE Left 08/07/2012   Procedure: OPEN REDUCTION INTERNAL FIXATION (ORIF) LEFT ANKLE FRACTURE;  Surgeon: Mcarthur Rossetti, MD;  Location: [place];  Service: Orthopedics;  Laterality: Left;    Social History   Socioeconomic History  . Marital status: Married    Spouse name: Not on file  . Number of children: Not on file  . Years of education: Not on file  . Highest education level: Not on file  Occupational History  . Not on file  Social Needs  . Financial resource strain: Not on file  . Food insecurity:    Worry: Not on file    Inability: Not on file  . Transportation needs:    Medical: Not on file    Non-medical: Not on file  Tobacco Use  . Smoking status: Current Some Day Smoker    Packs/day: 0.25    Years: 20.00    Pack years: 5.00    Types: Cigarettes    Start date: 08/16/2017  . Smokeless tobacco: Never Used  . Tobacco comment: currently smokes 3 cigarettes a day  Substance and Sexual Activity  . Alcohol use: Yes    Alcohol/week: 6.0 standard drinks    Types: 6 Cans of beer per week    Comment: occ  . Drug use: No  . Sexual activity: Not on file  Lifestyle  . Physical activity:    Days per week: Not on file    Minutes per session: Not on file  . Stress: Not on file  Relationships  . Social connections:    Talks on phone: Not on file    Gets together: Not on file    Attends religious service: Not on file    Active member of club or organization: Not on file    Attends meetings of clubs or organizations: Not on file    Relationship status: Not on file  . Intimate partner  violence:    Fear of current or ex partner: Not on file    Emotionally abused: Not on file    Physically abused: Not on file    Forced sexual activity: Not on file  Other Topics Concern  . Not on file  Social History Narrative  . Not on file    Family History  Problem Relation Age of Onset  . Diabetes Mother   . Hypertension Mother   . Diabetes Brother   .  Hypertension Brother      Review of Systems  Constitutional: Negative.  Negative for chills, fever and weight loss.  HENT: Negative.  Negative for hearing loss.   Eyes: Negative.  Negative for blurred vision and double vision.  Respiratory: Negative.  Negative for cough and shortness of breath.   Cardiovascular: Negative.  Negative for chest pain, palpitations, orthopnea and leg swelling.  Gastrointestinal: Negative.  Negative for abdominal pain, diarrhea, nausea and vomiting.  Genitourinary: Negative for dysuria and hematuria.  Musculoskeletal: Negative.  Negative for myalgias and neck pain.  Skin: Negative.  Negative for rash.  Neurological: Negative.  Negative for dizziness and headaches.  Endo/Heme/Allergies: Negative.   All other systems reviewed and are negative.  Vitals:   07/18/18 0843  BP: 128/79  Pulse: 85  Resp: 16  Temp: 98.5 F (36.9 C)  SpO2: 99%     Physical Exam Vitals signs reviewed.  Constitutional:      Appearance: Normal appearance.  HENT:     Head: Normocephalic and atraumatic.     Nose: Nose normal.     Mouth/Throat:     Mouth: Mucous membranes are moist.     Pharynx: Oropharynx is clear.  Eyes:     Extraocular Movements: Extraocular movements intact.     Conjunctiva/sclera: Conjunctivae normal.     Pupils: Pupils are equal, round, and reactive to light.  Neck:     Musculoskeletal: Normal range of motion and neck supple.  Cardiovascular:     Rate and Rhythm: Normal rate and regular rhythm.     Heart sounds: Normal heart sounds.  Pulmonary:     Effort: Pulmonary effort is normal.      Breath sounds: Normal breath sounds.  Abdominal:     General: There is no distension.     Palpations: Abdomen is soft. There is no mass.     Tenderness: There is no abdominal tenderness.  Musculoskeletal: Normal range of motion.     Right lower leg: No edema.     Left lower leg: No edema.  Skin:    General: Skin is warm and dry.     Capillary Refill: Capillary refill takes less than 2 seconds.  Neurological:     General: No focal deficit present.     Mental Status: He is alert and oriented to person, place, and time.     Sensory: No sensory deficit.     Motor: No weakness.  Psychiatric:        Mood and Affect: Mood normal.        Behavior: Behavior normal.     Results for orders placed or performed in visit on 07/18/18 (from the past 24 hour(s))  POCT glucose (manual entry)     Status: Abnormal   Collection Time: 07/18/18  9:11 AM  Result Value Ref Range   POC Glucose 140 (A) 70 - 99 mg/dl  POCT glycosylated hemoglobin (Hb A1C)     Status: Abnormal   Collection Time: 07/18/18  9:17 AM  Result Value Ref Range   Hemoglobin A1C 6.6 (A) 4.0 - 5.6 %   HbA1c POC (<> result, manual entry)     HbA1c, POC (prediabetic range)     HbA1c, POC (controlled diabetic range)     A total of 25 minutes was spent in the room with the patient, greater than 50% of which was in counseling/coordination of care regarding chronic medical problems including diabetes and hypertension, treatment and management, diet and nutrition, exercise and lifestyle choices, medications  and side effects, review of previous labs and history, prognosis, and need for follow-up.  ASSESSMENT & PLAN: Type 2 diabetes mellitus with hyperglycemia, without long-term current use of insulin (HCC) Well-controlled diabetes with hemoglobin A1c at 6.6 today.  Continue present medications, diet and exercise.  Follow-up in 6 months.  Essential hypertension, benign Well-controlled hypertension.  Continue present medications and  follow-up in 6 months.  Clifford Frederick was seen today for establish care and diabetes.  Diagnoses and all orders for this visit:  Type 2 diabetes mellitus with hyperglycemia, without long-term current use of insulin (HCC) -     POCT glucose (manual entry) -     POCT glycosylated hemoglobin (Hb A1C) -     CBC with Differential/Platelet -     Comprehensive metabolic panel -     Lipid panel  Essential hypertension, benign  Pure hypercholesterolemia  CAD in native artery  Encounter to establish care    Patient Instructions       If you have lab work done today you will be contacted with your lab results within the next 2 weeks.  If you have not heard from [place] then please contact us. The fastest way to get your results is to register for My Chart.   IF you received an x-ray today, you will receive an invoice from [facility]. Please contact [facility] at [phone] with questions or concerns regarding your invoice.   IF you received labwork today, you will receive an invoice from [facility]. Please contact [facility] at [phone] with questions or concerns regarding your invoice.   Our billing staff will not be able to assist you with questions regarding bills from these companies.  You will be contacted with the lab results as soon as they are available. The fastest way to get your results is to activate your My Chart account. Instructions are located on the last page of this paperwork. If you have not heard from [place] regarding the results in 2 weeks, please contact this office.     Diabetes Mellitus and Nutrition, Adult When you have diabetes (diabetes mellitus), it is very important to have healthy eating habits because your blood sugar (glucose) levels are greatly affected by what you eat and drink. Eating healthy foods in the appropriate amounts, at about the same times every day, can help you:  Control your blood glucose.  Lower your risk of heart  disease.  Improve your blood pressure.  Reach or maintain a healthy weight. Every person with diabetes is different, and each person has different needs for a meal plan. Your health care provider may recommend that you work with a diet and nutrition specialist (dietitian) to make a meal plan that is best for you. Your meal plan may vary depending on factors such as:  The calories you need.  The medicines you take.  Your weight.  Your blood glucose, blood pressure, and cholesterol levels.  Your activity level.  Other health conditions you have, such as heart or kidney disease. How do carbohydrates affect me? Carbohydrates, also called carbs, affect your blood glucose level more than any other type of food. Eating carbs naturally raises the amount of glucose in your blood. Carb counting is a method for keeping track of how many carbs you eat. Counting carbs is important to keep your blood glucose at a healthy level, especially if you use insulin or take certain oral diabetes medicines. It is important to know how many carbs you can safely have in each meal.  This is different for every person. Your dietitian can help you calculate how many carbs you should have at each meal and for each snack. Foods that contain carbs include:  Bread, cereal, rice, pasta, and crackers.  Potatoes and corn.  Peas, beans, and lentils.  Milk and yogurt.  Fruit and juice.  Desserts, such as cakes, cookies, ice cream, and candy. How does alcohol affect me? Alcohol can cause a sudden decrease in blood glucose (hypoglycemia), especially if you use insulin or take certain oral diabetes medicines. Hypoglycemia can be a life-threatening condition. Symptoms of hypoglycemia (sleepiness, dizziness, and confusion) are similar to symptoms of having too much alcohol. If your health care provider says that alcohol is safe for you, follow these guidelines:  Limit alcohol intake to no more than 1 drink per day for  nonpregnant women and 2 drinks per day for men. One drink equals 12 oz of beer, 5 oz of wine, or 1 oz of hard liquor.  Do not drink on an empty stomach.  Keep yourself hydrated with water, diet soda, or unsweetened iced tea.  Keep in mind that regular soda, juice, and other mixers may contain a lot of sugar and must be counted as carbs. What are tips for following this plan?  Reading food labels  Start by checking the serving size on the "Nutrition Facts" label of packaged foods and drinks. The amount of calories, carbs, fats, and other nutrients listed on the label is based on one serving of the item. Many items contain more than one serving per package.  Check the total grams (g) of carbs in one serving. You can calculate the number of servings of carbs in one serving by dividing the total carbs by 15. For example, if a food has 30 g of total carbs, it would be equal to 2 servings of carbs.  Check the number of grams (g) of saturated and trans fats in one serving. Choose foods that have low or no amount of these fats.  Check the number of milligrams (mg) of salt (sodium) in one serving. Most people should limit total sodium intake to less than 2,300 mg per day.  Always check the nutrition information of foods labeled as "low-fat" or "nonfat". These foods may be higher in added sugar or refined carbs and should be avoided.  Talk to your dietitian to identify your daily goals for nutrients listed on the label. Shopping  Avoid buying canned, premade, or processed foods. These foods tend to be high in fat, sodium, and added sugar.  Shop around the outside edge of the grocery store. This includes fresh fruits and vegetables, bulk grains, fresh meats, and fresh dairy. Cooking  Use low-heat cooking methods, such as baking, instead of high-heat cooking methods like deep frying.  Cook using healthy oils, such as olive, canola, or sunflower oil.  Avoid cooking with butter, cream, or  high-fat meats. Meal planning  Eat meals and snacks regularly, preferably at the same times every day. Avoid going long periods of time without eating.  Eat foods high in fiber, such as fresh fruits, vegetables, beans, and whole grains. Talk to your dietitian about how many servings of carbs you can eat at each meal.  Eat 4-6 ounces (oz) of lean protein each day, such as lean meat, chicken, fish, eggs, or tofu. One oz of lean protein is equal to: ? 1 oz of meat, chicken, or fish. ? 1 egg. ?  cup of tofu.  Eat some foods each  day that contain healthy fats, such as avocado, nuts, seeds, and fish. Lifestyle  Check your blood glucose regularly.  Exercise regularly as told by your health care provider. This may include: ? 150 minutes of moderate-intensity or vigorous-intensity exercise each week. This could be brisk walking, biking, or water aerobics. ? Stretching and doing strength exercises, such as yoga or weightlifting, at least 2 times a week.  Take medicines as told by your health care provider.  Do not use any products that contain nicotine or tobacco, such as cigarettes and e-cigarettes. If you need help quitting, ask your health care provider.  Work with a Social worker or diabetes educator to identify strategies to manage stress and any emotional and social challenges. Questions to ask a health care provider  Do I need to meet with a diabetes educator?  Do I need to meet with a dietitian?  What number can I call if I have questions?  When are the best times to check my blood glucose? Where to find more information:  American Diabetes Association: diabetes.org  Academy of Nutrition and Dietetics: www.eatright.[employer] of Diabetes and Digestive and Kidney Diseases (NIH): [employer] Summary  A healthy meal plan will help you control your blood glucose and maintain a healthy lifestyle.  Working with a diet and nutrition specialist (dietitian) can help  you make a meal plan that is best for you.  Keep in mind that carbohydrates (carbs) and alcohol have immediate effects on your blood glucose levels. It is important to count carbs and to use alcohol carefully. This information is not intended to replace advice given to you by your health care provider. Make sure you discuss any questions you have with your health care provider. Document Released: 02/17/2005 Document Revised: 12/21/2016 Document Reviewed: 06/27/2016 Elsevier Interactive Patient Education  2019 Elsevier Inc.      Agustina Caroli, MD [facility]

## 2018-07-18 NOTE — Assessment & Plan Note (Signed)
Well-controlled diabetes with hemoglobin A1c at 6.6 today.  Continue present medications, diet and exercise.  Follow-up in 6 months.

## 2018-07-19 LAB — CBC WITH DIFFERENTIAL/PLATELET
Basophils Absolute: 0 10*3/uL (ref 0.0–0.2)
Basos: 0 %
EOS (ABSOLUTE): 0.2 10*3/uL (ref 0.0–0.4)
Eos: 2 %
Hematocrit: 37.8 % (ref 37.5–51.0)
Hemoglobin: 12.4 g/dL — ABNORMAL LOW (ref 13.0–17.7)
Immature Grans (Abs): 0 10*3/uL (ref 0.0–0.1)
Immature Granulocytes: 0 %
Lymphocytes Absolute: 2.3 10*3/uL (ref 0.7–3.1)
Lymphs: 31 %
MCH: 29.3 pg (ref 26.6–33.0)
MCHC: 32.8 g/dL (ref 31.5–35.7)
MCV: 89 fL (ref 79–97)
Monocytes Absolute: 0.5 10*3/uL (ref 0.1–0.9)
Monocytes: 6 %
Neutrophils Absolute: 4.5 10*3/uL (ref 1.4–7.0)
Neutrophils: 61 %
Platelets: 211 10*3/uL (ref 150–450)
RBC: 4.23 x10E6/uL (ref 4.14–5.80)
RDW: 14.5 % (ref 11.6–15.4)
WBC: 7.5 10*3/uL (ref 3.4–10.8)

## 2018-07-19 LAB — COMPREHENSIVE METABOLIC PANEL
ALT: 24 IU/L (ref 0–44)
AST: 22 IU/L (ref 0–40)
Albumin/Globulin Ratio: 1.9 (ref 1.2–2.2)
Albumin: 4.5 g/dL (ref 3.8–4.9)
Alkaline Phosphatase: 55 IU/L (ref 39–117)
BUN/Creatinine Ratio: 10 (ref 9–20)
BUN: 11 mg/dL (ref 6–24)
Bilirubin Total: 0.6 mg/dL (ref 0.0–1.2)
CO2: 20 mmol/L (ref 20–29)
Calcium: 9.2 mg/dL (ref 8.7–10.2)
Chloride: 105 mmol/L (ref 96–106)
Creatinine, Ser: 1.09 mg/dL (ref 0.76–1.27)
GFR calc Af Amer: 90 mL/min/{1.73_m2} (ref >59)
GFR calc non Af Amer: 78 mL/min/{1.73_m2} (ref >59)
Globulin, Total: 2.4 g/dL (ref 1.5–4.5)
Glucose: 130 mg/dL — ABNORMAL HIGH (ref 65–99)
Potassium: 4.2 mmol/L (ref 3.5–5.2)
Sodium: 143 mmol/L (ref 134–144)
Total Protein: 6.9 g/dL (ref 6.0–8.5)

## 2018-07-19 LAB — LIPID PANEL
Chol/HDL Ratio: 2.7 ratio (ref 0.0–5.0)
Cholesterol, Total: 125 mg/dL (ref 100–199)
HDL: 47 mg/dL (ref >39)
LDL Calculated: 60 mg/dL (ref 0–99)
Triglycerides: 92 mg/dL (ref 0–149)
VLDL Cholesterol Cal: 18 mg/dL (ref 5–40)

## 2018-08-20 ENCOUNTER — Other Ambulatory Visit (HOSPITAL_COMMUNITY): Payer: Self-pay | Admitting: Cardiology

## 2018-09-03 ENCOUNTER — Other Ambulatory Visit: Payer: Self-pay | Admitting: Cardiology

## 2018-09-16 ENCOUNTER — Other Ambulatory Visit: Payer: Self-pay | Admitting: Cardiology

## 2018-09-28 ENCOUNTER — Other Ambulatory Visit: Payer: Self-pay | Admitting: Family Medicine

## 2018-09-28 NOTE — Telephone Encounter (Signed)
Requested Prescriptions  Pending Prescriptions Disp Refills  . metFORMIN (GLUCOPHAGE) 1000 MG tablet [Pharmacy Med Name: metFORMIN HCl 1000 MG Oral Tablet] 180 tablet 0    Sig: TAKE 1 TABLET BY MOUTH TWICE DAILY WITH A MEAL     Endocrinology:  Diabetes - Biguanides Passed - 09/28/2018  9:20 AM      Passed - Cr in normal range and within 360 days    Creatinine, Ser  Date Value Ref Range Status  07/18/2018 1.09 0.76 - 1.27 mg/dL Final         Passed - HBA1C is between 0 and 7.9 and within 180 days    Hemoglobin A1C  Date Value Ref Range Status  07/18/2018 6.6 (A) 4.0 - 5.6 % Final   Hgb A1c MFr Bld  Date Value Ref Range Status  08/16/2017 8.9 (H) 4.8 - 5.6 % Final    Comment:    (NOTE) Pre diabetes:          5.7%-6.4% Diabetes:              >6.4% Glycemic control for   <7.0% adults with diabetes          Passed - eGFR in normal range and within 360 days    GFR calc Af Amer  Date Value Ref Range Status  07/18/2018 90 >59 mL/min/1.73 Final   GFR calc non Af Amer  Date Value Ref Range Status  07/18/2018 78 >59 mL/min/1.73 Final         Passed - Valid encounter within last 6 months    Recent Outpatient Visits          2 months ago Type 2 diabetes mellitus with hyperglycemia, without long-term current use of insulin ([place])   Primary Care at [facility], Ines Bloomer, MD   3 months ago Flu-like symptoms   Primary Care at [facility], Fenton Malling, MD   6 months ago Type 2 diabetes mellitus with hyperglycemia, without long-term current use of insulin [facility])   Primary Care at [place], [place] D, PA-C   6 months ago Pain in joint involving right ankle and foot   Primary Care at [facility], Ranell Patrick, MD   10 months ago Left cervical radiculopathy   Primary Care at [facility], Ranell Patrick, MD      Future Appointments            In 3 months [place], Ines Bloomer, MD Primary Care at [place], [facility]

## 2018-10-06 ENCOUNTER — Other Ambulatory Visit: Payer: Self-pay | Admitting: Cardiology

## 2018-10-16 ENCOUNTER — Other Ambulatory Visit: Payer: Self-pay | Admitting: Family Medicine
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
## 2018-11-01 ENCOUNTER — Ambulatory Visit: Payer: No Typology Code available for payment source | Admitting: Emergency Medicine

## 2018-11-01 ENCOUNTER — Encounter: Payer: Self-pay | Admitting: Emergency Medicine

## 2018-11-01 ENCOUNTER — Other Ambulatory Visit: Payer: Self-pay

## 2018-11-01 VITALS — BP 140/90 | HR 84 | Temp 98.7°F | Resp 16 | Ht 71.0 in | Wt 232.2 lb

## 2018-11-01 DIAGNOSIS — I10 Essential (primary) hypertension: Secondary | ICD-10-CM | POA: Diagnosis not present

## 2018-11-01 DIAGNOSIS — R0600 Dyspnea, unspecified: Secondary | ICD-10-CM

## 2018-11-01 DIAGNOSIS — E1165 Type 2 diabetes mellitus with hyperglycemia: Secondary | ICD-10-CM | POA: Diagnosis not present

## 2018-11-01 LAB — POCT GLYCOSYLATED HEMOGLOBIN (HGB A1C): Hemoglobin A1C: 7.1 % — AB (ref 4.0–5.6)

## 2018-11-01 LAB — GLUCOSE, POCT (MANUAL RESULT ENTRY): POC Glucose: 86 mg/dl (ref 70–99)

## 2018-11-01 NOTE — Patient Instructions (Addendum)
   If you have lab work done today you will be contacted with your lab results within the next 2 weeks.  If you have not heard from us then please contact us. The fastest way to get your results is to register for My Chart.   IF you received an x-ray today, you will receive an invoice from [facility]. Please contact [facility] at [phone] with questions or concerns regarding your invoice.   IF you received labwork today, you will receive an invoice from [facility]. Please contact [facility] at [phone] with questions or concerns regarding your invoice.   Our billing staff will not be able to assist you with questions regarding bills from these companies.  You will be contacted with the lab results as soon as they are available. The fastest way to get your results is to activate your My Chart account. Instructions are located on the last page of this paperwork. If you have not heard from us regarding the results in 2 weeks, please contact this office.     Shortness of Breath, Adult Shortness of breath means you have trouble breathing. Shortness of breath could be a sign of a medical problem. Follow these instructions at home:   Watch for any changes in your symptoms.  Do not use any products that contain nicotine or tobacco, such as cigarettes, e-cigarettes, and chewing tobacco.  Do not smoke. Smoking can cause shortness of breath. If you need help to quit smoking, ask your doctor.  Avoid things that can make it harder to breathe, such as: ? Mold. ? Dust. ? Air pollution. ? Chemical smells. ? Things that can cause allergy symptoms (allergens), if you have allergies.  Keep your living space clean. Use products that help remove mold and dust.  Rest as needed. Slowly return to your normal activities.  Take over-the-counter and prescription medicines only as told by your doctor. This includes oxygen therapy and inhaled medicines.  Keep all follow-up visits  as told by your doctor. This is important. Contact a doctor if:  Your condition does not get better as soon as expected.  You have a hard time doing your normal activities, even after you rest.  You have new symptoms. Get help right away if:  Your shortness of breath gets worse.  You have trouble breathing when you are resting.  You feel light-headed or you pass out (faint).  You have a cough that is not helped by medicines.  You cough up blood.  You have pain with breathing.  You have pain in your chest, arms, shoulders, or belly (abdomen).  You have a fever.  You cannot walk up stairs.  You cannot exercise the way you normally do. These symptoms may represent a serious problem that is an emergency. Do not wait to see if the symptoms will go away. Get medical help right away. Call your local emergency services (911 in the [place] Do not drive yourself to the hospital. Summary  Shortness of breath is when you have trouble breathing enough air. It can be a sign of a medical problem.  Avoid things that make it hard for you to breathe, such as smoking, pollution, mold, and dust.  Watch for any changes in your symptoms. Contact your doctor if you do not get better or you get worse. This information is not intended to replace advice given to you by your health care provider. Make sure you discuss any questions you have with your health care provider. Document   Document Released: 11/09/2007 Document Revised: 10/23/2017 Document Reviewed: 10/23/2017 Elsevier Interactive Patient Education  2019 [employer].

## 2018-11-01 NOTE — Progress Notes (Signed)
Lab Results  Component Value Date   HGBA1C 6.6 (A) 07/18/2018   BP Readings from Last 3 Encounters:  07/18/18 128/79  06/04/18 109/75  04/06/18 128/78   Lab Results  Component Value Date   CHOL 125 07/18/2018   HDL 47 07/18/2018   LDLCALC 60 07/18/2018   TRIG 92 07/18/2018   CHOLHDL 2.7 07/18/2018   Lab Results  Component Value Date   CREATININE 1.09 07/18/2018   BUN 11 07/18/2018   NA 143 07/18/2018   K 4.2 07/18/2018   CL 105 07/18/2018   CO2 20 07/18/2018   GFR calc Af Amer >59 mL/min/1.73 90  >60 R, CM 107   >60 R, CM >60 R, CM >60 R, CM   Clifford Frederick 53 y.o.   Chief Complaint  Patient presents with  . Shortness of Breath    Patient stated he just started a one a day vit and after taking this medication been having sob about 40-40 min later. The sob go away after a while. Not sure if it this medication or what.     HISTORY OF PRESENT ILLNESS: This is a 53 y.o. male diabetic and hypertensive who has been getting fleeting short acting episodes of shortness of breath at work for the past several days.  Episodes do not happen at home.  Not associated with any other symptoms.  Denies fever or chills.  Denies flulike symptoms.  HPI   Prior to Admission medications   Medication Sig Start Date End Date Taking? Authorizing Provider  amLODipine (NORVASC) 5 MG tablet Take 1 tablet by mouth once daily 09/17/18  Yes Turner, Eber Hong, MD  aspirin 81 MG chewable tablet Chew 1 tablet (81 mg total) by mouth daily. 08/18/17  Yes Daune Perch, NP  atorvastatin (LIPITOR) 80 MG tablet TAKE 1 TABLET BY MOUTH ONCE DAILY AT [facility] 09/04/18  Yes Daune Perch, NP  blood glucose meter kit and supplies Dispense based on patient and insurance preference. Use up to four times daily as directed. (FOR ICD-10 E10.9, E11.9). 06/04/18  Yes Posey Boyer, MD  carvedilol (COREG) 25 MG tablet Take 1 tablet by mouth twice daily 10/08/18  Yes Turner, Eber Hong, MD  clopidogrel (PLAVIX) 75 MG tablet  Take 1 tablet (75 mg total) by mouth daily. 07/13/18 07/13/19 Yes Sueanne Margarita, MD  JARDIANCE 10 MG TABS tablet Take 1 tablet by mouth once daily 10/16/18  Yes Rutherford Guys, MD  lisinopril (PRINIVIL,ZESTRIL) 40 MG tablet Take 1 tablet by mouth once daily 08/20/18  Yes Daune Perch, NP  metFORMIN (GLUCOPHAGE) 1000 MG tablet TAKE 1 TABLET BY MOUTH TWICE DAILY WITH A MEAL 09/28/18  Yes Robbie Rideaux, Ines Bloomer, MD  nitroGLYCERIN (NITROSTAT) 0.4 MG SL tablet Place 1 tablet (0.4 mg total) under the tongue every 5 (five) minutes as needed for chest pain. 08/17/17  Yes Daune Perch, NP    No Known Allergies  Patient Active Problem List   Diagnosis Date Noted  . CAD in native artery   . Pure hypercholesterolemia   . Tobacco abuse 08/15/2017  . Type 2 diabetes mellitus with hyperglycemia, without long-term current use of insulin ([place]) 08/15/2017  . Normocytic anemia 08/15/2017  . Essential hypertension, benign 08/08/2012    Past Medical History:  Diagnosis Date  . Abnormal EKG   . Acute coronary syndrome ([place]) 08/15/2017  . CAD in native artery    A. LHC 08/16/17- DES to RCA  . Chest pain 08/15/2017  . Diabetes mellitus type  2 in obese ([place]) 08/15/2017  . Essential hypertension, benign 08/08/2012  . Fracture of lateral malleolus of left ankle 08/07/2012  . High cholesterol   . Hypertensive urgency 08/15/2017  . Sleep apnea   . Tobacco abuse 08/15/2017  . Unstable angina [facility])     Past Surgical History:  Procedure Laterality Date  . CORONARY STENT INTERVENTION N/A 08/16/2017   Procedure: CORONARY STENT INTERVENTION;  Surgeon: Jettie Booze, MD;  Location: [place] [facility];  Service: Cardiovascular;  Laterality: N/A;  . LEFT HEART CATH AND CORONARY ANGIOGRAPHY N/A 08/16/2017   Procedure: LEFT HEART CATH AND CORONARY ANGIOGRAPHY;  Surgeon: Jettie Booze, MD;  Location: [place] [facility];  Service: Cardiovascular;  Laterality: N/A;  . ORIF ANKLE FRACTURE Left 08/07/2012    Procedure: OPEN REDUCTION INTERNAL FIXATION (ORIF) LEFT ANKLE FRACTURE;  Surgeon: Mcarthur Rossetti, MD;  Location: [place];  Service: Orthopedics;  Laterality: Left;    Social History   Socioeconomic History  . Marital status: Married    Spouse name: Not on file  . Number of children: Not on file  . Years of education: Not on file  . Highest education level: Not on file  Occupational History  . Not on file  Social Needs  . Financial resource strain: Not on file  . Food insecurity:    Worry: Not on file    Inability: Not on file  . Transportation needs:    Medical: Not on file    Non-medical: Not on file  Tobacco Use  . Smoking status: Current Some Day Smoker    Packs/day: 0.25    Years: 20.00    Pack years: 5.00    Types: Cigarettes    Start date: 08/16/2017  . Smokeless tobacco: Never Used  . Tobacco comment: currently smokes 3 cigarettes a day  Substance and Sexual Activity  . Alcohol use: Yes    Alcohol/week: 6.0 standard drinks    Types: 6 Cans of beer per week    Comment: occ  . Drug use: No  . Sexual activity: Not on file  Lifestyle  . Physical activity:    Days per week: Not on file    Minutes per session: Not on file  . Stress: Not on file  Relationships  . Social connections:    Talks on phone: Not on file    Gets together: Not on file    Attends religious service: Not on file    Active member of club or organization: Not on file    Attends meetings of clubs or organizations: Not on file    Relationship status: Not on file  . Intimate partner violence:    Fear of current or ex partner: Not on file    Emotionally abused: Not on file    Physically abused: Not on file    Forced sexual activity: Not on file  Other Topics Concern  . Not on file  Social History Narrative  . Not on file    Family History  Problem Relation Age of Onset  . Diabetes Mother   . Hypertension Mother   . Diabetes Brother   . Hypertension Brother      Review of  Systems  Constitutional: Negative.  Negative for chills and fever.  HENT: Negative.  Negative for congestion and sore throat.   Eyes: Negative.   Respiratory: Positive for shortness of breath (Intermittent episodes). Negative for cough.   Cardiovascular: Negative.  Negative for chest pain and palpitations.  Gastrointestinal: Negative.  Negative for abdominal pain, diarrhea, nausea and vomiting.  Genitourinary: Negative.   Musculoskeletal: Negative.   Skin: Negative.  Negative for rash.  Neurological: Negative.  Negative for dizziness and headaches.  All other systems reviewed and are negative.  Vitals:   11/01/18 1417  BP: 140/90  Pulse: 84  Resp: 16  Temp: 98.7 F (37.1 C)  SpO2: 97%     Physical Exam Vitals signs reviewed.  Constitutional:      Appearance: He is well-developed.  HENT:     Head: Normocephalic and atraumatic.     Nose: Nose normal.     Mouth/Throat:     Mouth: Mucous membranes are moist.     Pharynx: Oropharynx is clear.  Eyes:     Extraocular Movements: Extraocular movements intact.     Conjunctiva/sclera: Conjunctivae normal.     Pupils: Pupils are equal, round, and reactive to light.  Neck:     Musculoskeletal: Normal range of motion and neck supple.  Cardiovascular:     Rate and Rhythm: Normal rate and regular rhythm.     Pulses: Normal pulses.     Heart sounds: Normal heart sounds.  Pulmonary:     Effort: Pulmonary effort is normal.     Breath sounds: Normal breath sounds.  Abdominal:     Palpations: Abdomen is soft.     Tenderness: There is no abdominal tenderness.  Musculoskeletal: Normal range of motion.     Right lower leg: No edema.     Left lower leg: No edema.  Skin:    General: Skin is warm and dry.     Capillary Refill: Capillary refill takes less than 2 seconds.  Neurological:     General: No focal deficit present.     Mental Status: He is alert and oriented to person, place, and time.  Psychiatric:        Mood and Affect:  Mood normal.        Behavior: Behavior normal.    Results for orders placed or performed in visit on 11/01/18 (from the past 24 hour(s))  POCT glucose (manual entry)     Status: None   Collection Time: 11/01/18  3:05 PM  Result Value Ref Range   POC Glucose 86 70 - 99 mg/dl  POCT glycosylated hemoglobin (Hb A1C)     Status: Abnormal   Collection Time: 11/01/18  3:05 PM  Result Value Ref Range   Hemoglobin A1C 7.1 (A) 4.0 - 5.6 %   HbA1c POC (<> result, manual entry)     HbA1c, POC (prediabetic range)     HbA1c, POC (controlled diabetic range)       ASSESSMENT & PLAN: Kashden was seen today for shortness of breath.  Diagnoses and all orders for this visit:  Dyspnea, unspecified type  Type 2 diabetes mellitus with hyperglycemia, without long-term current use of insulin (HCC) -     POCT glucose (manual entry) -     POCT glycosylated hemoglobin (Hb A1C) -     Comprehensive metabolic panel -     CBC with Differential/Platelet  Essential hypertension, benign    Patient Instructions       If you have lab work done today you will be contacted with your lab results within the next 2 weeks.  If you have not heard from [place] then please contact us. The fastest way to get your results is to register for My Chart.   IF you received an x-ray today, you will  receive an Pharmacologist from [facility]. Please contact [facility] at [phone] with questions or concerns regarding your invoice.   IF you received labwork today, you will receive an invoice from [facility]. Please contact [facility] at [phone] with questions or concerns regarding your invoice.   Our billing staff will not be able to assist you with questions regarding bills from these companies.  You will be contacted with the lab results as soon as they are available. The fastest way to get your results is to activate your My Chart account. Instructions are located on the last page of this paperwork. If you  have not heard from [place] regarding the results in 2 weeks, please contact this office.      Shortness of Breath, Adult Shortness of breath means you have trouble breathing. Shortness of breath could be a sign of a medical problem. Follow these instructions at home:   Watch for any changes in your symptoms.  Do not use any products that contain nicotine or tobacco, such as cigarettes, e-cigarettes, and chewing tobacco.  Do not smoke. Smoking can cause shortness of breath. If you need help to quit smoking, ask your doctor.  Avoid things that can make it harder to breathe, such as: ? Mold. ? Dust. ? Air pollution. ? Chemical smells. ? Things that can cause allergy symptoms (allergens), if you have allergies.  Keep your living space clean. Use products that help remove mold and dust.  Rest as needed. Slowly return to your normal activities.  Take over-the-counter and prescription medicines only as told by your doctor. This includes oxygen therapy and inhaled medicines.  Keep all follow-up visits as told by your doctor. This is important. Contact a doctor if:  Your condition does not get better as soon as expected.  You have a hard time doing your normal activities, even after you rest.  You have new symptoms. Get help right away if:  Your shortness of breath gets worse.  You have trouble breathing when you are resting.  You feel light-headed or you pass out (faint).  You have a cough that is not helped by medicines.  You cough up blood.  You have pain with breathing.  You have pain in your chest, arms, shoulders, or belly (abdomen).  You have a fever.  You cannot walk up stairs.  You cannot exercise the way you normally do. These symptoms may represent a serious problem that is an emergency. Do not wait to see if the symptoms will go away. Get medical help right away. Call your local emergency services (911 in the [place] Do not drive yourself to the  hospital. Summary  Shortness of breath is when you have trouble breathing enough air. It can be a sign of a medical problem.  Avoid things that make it hard for you to breathe, such as smoking, pollution, mold, and dust.  Watch for any changes in your symptoms. Contact your doctor if you do not get better or you get worse. This information is not intended to replace advice given to you by your health care provider. Make sure you discuss any questions you have with your health care provider. Document Released: 11/09/2007 Document Revised: 10/23/2017 Document Reviewed: 10/23/2017 Elsevier Interactive Patient Education  2019 [employer]       Agustina Caroli, MD [facility]

## 2018-11-02 LAB — COMPREHENSIVE METABOLIC PANEL
ALT: 27 IU/L (ref 0–44)
AST: 25 IU/L (ref 0–40)
Albumin/Globulin Ratio: 2 (ref 1.2–2.2)
Albumin: 4.5 g/dL (ref 3.8–4.9)
Alkaline Phosphatase: 44 IU/L (ref 39–117)
BUN/Creatinine Ratio: 11 (ref 9–20)
BUN: 12 mg/dL (ref 6–24)
Bilirubin Total: 0.5 mg/dL (ref 0.0–1.2)
CO2: 21 mmol/L (ref 20–29)
Calcium: 9.7 mg/dL (ref 8.7–10.2)
Chloride: 104 mmol/L (ref 96–106)
Creatinine, Ser: 1.07 mg/dL (ref 0.76–1.27)
GFR calc Af Amer: 92 mL/min/{1.73_m2} (ref >59)
GFR calc non Af Amer: 79 mL/min/{1.73_m2} (ref >59)
Globulin, Total: 2.3 g/dL (ref 1.5–4.5)
Glucose: 83 mg/dL (ref 65–99)
Potassium: 3.8 mmol/L (ref 3.5–5.2)
Sodium: 143 mmol/L (ref 134–144)
Total Protein: 6.8 g/dL (ref 6.0–8.5)

## 2018-11-02 LAB — CBC WITH DIFFERENTIAL/PLATELET
Basophils Absolute: 0 10*3/uL (ref 0.0–0.2)
Basos: 0 %
EOS (ABSOLUTE): 0.2 10*3/uL (ref 0.0–0.4)
Eos: 2 %
Hematocrit: 39.8 % (ref 37.5–51.0)
Hemoglobin: 13 g/dL (ref 13.0–17.7)
Immature Grans (Abs): 0 10*3/uL (ref 0.0–0.1)
Immature Granulocytes: 0 %
Lymphocytes Absolute: 3 10*3/uL (ref 0.7–3.1)
Lymphs: 34 %
MCH: 29 pg (ref 26.6–33.0)
MCHC: 32.7 g/dL (ref 31.5–35.7)
MCV: 89 fL (ref 79–97)
Monocytes Absolute: 0.6 10*3/uL (ref 0.1–0.9)
Monocytes: 7 %
Neutrophils Absolute: 5 10*3/uL (ref 1.4–7.0)
Neutrophils: 57 %
Platelets: 196 10*3/uL (ref 150–450)
RBC: 4.49 x10E6/uL (ref 4.14–5.80)
RDW: 14.5 % (ref 11.6–15.4)
WBC: 8.8 10*3/uL (ref 3.4–10.8)

## 2018-11-05 ENCOUNTER — Ambulatory Visit: Payer: No Typology Code available for payment source | Admitting: Emergency Medicine

## 2018-11-20 ENCOUNTER — Other Ambulatory Visit: Payer: Self-pay | Admitting: Cardiology

## 2019-01-05 ENCOUNTER — Other Ambulatory Visit: Payer: Self-pay | Admitting: Emergency Medicine

## 2019-01-05 NOTE — Telephone Encounter (Signed)
Requested Prescriptions  Pending Prescriptions Disp Refills  . metFORMIN (GLUCOPHAGE) 1000 MG tablet [Pharmacy Med Name: metFORMIN HCl 1000 MG Oral Tablet] 180 tablet 0    Sig: TAKE 1 TABLET BY MOUTH TWICE DAILY WITH A MEAL     Endocrinology:  Diabetes - Biguanides Passed - 01/05/2019 11:45 AM      Passed - Cr in normal range and within 360 days    Creatinine, Ser  Date Value Ref Range Status  11/01/2018 1.07 0.76 - 1.27 mg/dL Final         Passed - HBA1C is between 0 and 7.9 and within 180 days    Hemoglobin A1C  Date Value Ref Range Status  11/01/2018 7.1 (A) 4.0 - 5.6 % Final   Hgb A1c MFr Bld  Date Value Ref Range Status  08/16/2017 8.9 (H) 4.8 - 5.6 % Final    Comment:    (NOTE) Pre diabetes:          5.7%-6.4% Diabetes:              >6.4% Glycemic control for   <7.0% adults with diabetes          Passed - eGFR in normal range and within 360 days    GFR calc Af Amer  Date Value Ref Range Status  11/01/2018 92 >59 mL/min/1.73 Final   GFR calc non Af Amer  Date Value Ref Range Status  11/01/2018 79 >59 mL/min/1.73 Final         Passed - Valid encounter within last 6 months    Recent Outpatient Visits          2 months ago Dyspnea, unspecified type   [facility] at [place], Maxwell, MD   5 months ago Type 2 diabetes mellitus with hyperglycemia, without long-term current use of insulin [facility])   Primary Care at [facility], Ines Bloomer, MD   7 months ago Flu-like symptoms   [facility] at [facility], Fenton Malling, MD   9 months ago Type 2 diabetes mellitus with hyperglycemia, without long-term current use of insulin [facility])   [facility] at [place], [place] D, PA-C   10 months ago Pain in joint involving right ankle and foot   [facility] at [facility], Ranell Patrick, MD      Future Appointments            In 1 week [place], Ines Bloomer, MD Primary Care at [place], [facility]

## 2019-01-15 ENCOUNTER — Encounter: Payer: Self-pay | Admitting: Emergency Medicine

## 2019-01-15 ENCOUNTER — Other Ambulatory Visit: Payer: Self-pay

## 2019-01-15 ENCOUNTER — Ambulatory Visit (INDEPENDENT_AMBULATORY_CARE_PROVIDER_SITE_OTHER): Payer: No Typology Code available for payment source | Admitting: Emergency Medicine

## 2019-01-15 VITALS — BP 129/79 | HR 89 | Temp 98.7°F | Resp 16 | Ht 71.0 in | Wt 229.2 lb

## 2019-01-15 DIAGNOSIS — I2510 Atherosclerotic heart disease of native coronary artery without angina pectoris: Secondary | ICD-10-CM

## 2019-01-15 DIAGNOSIS — I10 Essential (primary) hypertension: Secondary | ICD-10-CM | POA: Diagnosis not present

## 2019-01-15 DIAGNOSIS — I152 Hypertension secondary to endocrine disorders: Secondary | ICD-10-CM

## 2019-01-15 DIAGNOSIS — E7800 Pure hypercholesterolemia, unspecified: Secondary | ICD-10-CM

## 2019-01-15 DIAGNOSIS — E1159 Type 2 diabetes mellitus with other circulatory complications: Secondary | ICD-10-CM | POA: Diagnosis not present

## 2019-01-15 NOTE — Progress Notes (Signed)
BP Readings from Last 3 Encounters:  01/15/19 129/79  11/01/18 140/90  07/18/18 128/79   Lab Results  Component Value Date   CHOL 125 07/18/2018   HDL 47 07/18/2018   LDLCALC 60 07/18/2018   TRIG 92 07/18/2018   CHOLHDL 2.7 07/18/2018   Lab Results  Component Value Date   HGBA1C 7.1 (A) 11/01/2018   Wt Readings from Last 3 Encounters:  01/15/19 229 lb 3.2 oz (104 kg)  11/01/18 232 lb 3.2 oz (105.3 kg)  07/18/18 226 lb 6.4 oz (102.7 kg)   Clifford Frederick 53 y.o.   Chief Complaint  Patient presents with  . Diabetes    follow up x 6 months  . Hypertension    HISTORY OF PRESENT ILLNESS: This is a 53 y.o. male with history of diabetes and hypertension here for follow-up.  Doing well.  Has no complaints or medical concerns today.  Has the following medical problems: 1.  Hypertension: On Norvasc and Coreg and lisinopril.  Doing well.  Normal blood pressures at home. 2.  Diabetes: On Jardiance and metformin.  Doing well.  Does not check blood sugars at home.  Physically active and following proper nutrition. 3.  Coronary artery disease: Stent in place.  On aspirin and Plavix.  Has not had to use sublingual nitroglycerin in a long while.  Denies chest pain or dyspnea on exertion. 4.  Dyslipidemia: On Lipitor 80 mg a day. 5.  Still smoking: 3 to 5 cigarettes/day.  States he does not smoke during the weekends.  Only smokes at work.  Never at home.   HPI   Prior to Admission medications   Medication Sig Start Date End Date Taking? Authorizing Provider  amLODipine (NORVASC) 5 MG tablet Take 1 tablet (5 mg total) by mouth daily. 11/21/18  Yes Sueanne Margarita, MD  aspirin 81 MG chewable tablet Chew 1 tablet (81 mg total) by mouth daily. 08/18/17  Yes Daune Perch, NP  atorvastatin (LIPITOR) 80 MG tablet TAKE 1 TABLET BY MOUTH ONCE DAILY AT [facility] 09/04/18  Yes Daune Perch, NP  carvedilol (COREG) 25 MG tablet Take 1 tablet by mouth twice daily 10/08/18  Yes Turner, Eber Hong, MD    clopidogrel (PLAVIX) 75 MG tablet Take 1 tablet (75 mg total) by mouth daily. 07/13/18 07/13/19 Yes Sueanne Margarita, MD  JARDIANCE 10 MG TABS tablet Take 1 tablet by mouth once daily 10/16/18  Yes Rutherford Guys, MD  lisinopril (PRINIVIL,ZESTRIL) 40 MG tablet Take 1 tablet by mouth once daily 08/20/18  Yes Daune Perch, NP  metFORMIN (GLUCOPHAGE) 1000 MG tablet TAKE 1 TABLET BY MOUTH TWICE DAILY WITH A MEAL 01/05/19  Yes Madelin Weseman, Ines Bloomer, MD  nitroGLYCERIN (NITROSTAT) 0.4 MG SL tablet Place 1 tablet (0.4 mg total) under the tongue every 5 (five) minutes as needed for chest pain. 08/17/17  Yes Daune Perch, NP  blood glucose meter kit and supplies Dispense based on patient and insurance preference. Use up to four times daily as directed. (FOR ICD-10 E10.9, E11.9). 06/04/18   Posey Boyer, MD    No Known Allergies  Patient Active Problem List   Diagnosis Date Noted  . CAD in native artery   . Pure hypercholesterolemia   . Tobacco abuse 08/15/2017  . Type 2 diabetes mellitus with hyperglycemia, without long-term current use of insulin ([place]) 08/15/2017  . Normocytic anemia 08/15/2017  . Essential hypertension, benign 08/08/2012    Past Medical History:  Diagnosis Date  . Abnormal EKG   .  Acute coronary syndrome ([place]) 08/15/2017  . CAD in native artery    A. LHC 08/16/17- DES to RCA  . Chest pain 08/15/2017  . Diabetes mellitus type 2 in obese ([place]) 08/15/2017  . Essential hypertension, benign 08/08/2012  . Fracture of lateral malleolus of left ankle 08/07/2012  . High cholesterol   . Hypertensive urgency 08/15/2017  . Sleep apnea   . Tobacco abuse 08/15/2017  . Unstable angina [facility])     Past Surgical History:  Procedure Laterality Date  . CORONARY STENT INTERVENTION N/A 08/16/2017   Procedure: CORONARY STENT INTERVENTION;  Surgeon: Jettie Booze, MD;  Location: [place] [facility];  Service: Cardiovascular;  Laterality: N/A;  . LEFT HEART CATH AND CORONARY ANGIOGRAPHY N/A  08/16/2017   Procedure: LEFT HEART CATH AND CORONARY ANGIOGRAPHY;  Surgeon: Jettie Booze, MD;  Location: [place] [facility];  Service: Cardiovascular;  Laterality: N/A;  . ORIF ANKLE FRACTURE Left 08/07/2012   Procedure: OPEN REDUCTION INTERNAL FIXATION (ORIF) LEFT ANKLE FRACTURE;  Surgeon: Mcarthur Rossetti, MD;  Location: [place];  Service: Orthopedics;  Laterality: Left;    Social History   Socioeconomic History  . Marital status: Married    Spouse name: Not on file  . Number of children: Not on file  . Years of education: Not on file  . Highest education level: Not on file  Occupational History  . Not on file  Social Needs  . Financial resource strain: Not on file  . Food insecurity    Worry: Not on file    Inability: Not on file  . Transportation needs    Medical: Not on file    Non-medical: Not on file  Tobacco Use  . Smoking status: Current Some Day Smoker    Packs/day: 0.25    Years: 20.00    Pack years: 5.00    Types: Cigarettes    Start date: 08/16/2017  . Smokeless tobacco: Never Used  . Tobacco comment: currently smokes 3 cigarettes a day  Substance and Sexual Activity  . Alcohol use: Yes    Alcohol/week: 6.0 standard drinks    Types: 6 Cans of beer per week    Comment: occ  . Drug use: No  . Sexual activity: Not on file  Lifestyle  . Physical activity    Days per week: Not on file    Minutes per session: Not on file  . Stress: Not on file  Relationships  . Social Herbalist on phone: Not on file    Gets together: Not on file    Attends religious service: Not on file    Active member of club or organization: Not on file    Attends meetings of clubs or organizations: Not on file    Relationship status: Not on file  . Intimate partner violence    Fear of current or ex partner: Not on file    Emotionally abused: Not on file    Physically abused: Not on file    Forced sexual activity: Not on file  Other Topics Concern  . Not on file    Social History Narrative  . Not on file    Family History  Problem Relation Age of Onset  . Diabetes Mother   . Hypertension Mother   . Diabetes Brother   . Hypertension Brother      Review of Systems  Constitutional: Negative.  Negative for chills and fever.  HENT: Negative.  Negative for congestion and  sore throat.   Eyes: Negative.   Respiratory: Negative.  Negative for cough, shortness of breath and wheezing.   Cardiovascular: Negative.  Negative for chest pain, palpitations and leg swelling.  Gastrointestinal: Negative.  Negative for abdominal pain, blood in stool, melena, nausea and vomiting.  Genitourinary: Negative.  Negative for dysuria, frequency and hematuria.  Musculoskeletal: Negative.  Negative for back pain, myalgias and neck pain.  Skin: Negative.  Negative for rash.  Neurological: Negative.  Negative for dizziness and headaches.  All other systems reviewed and are negative.  Today's Vitals   01/15/19 0811  BP: 129/79  Pulse: 89  Resp: 16  Temp: 98.7 F (37.1 C)  TempSrc: Oral  SpO2: 97%  Weight: 229 lb 3.2 oz (104 kg)  Height: '5\' 11"'$  (1.803 m)   Body mass index is 31.97 kg/m.   Physical Exam Vitals signs reviewed.  Constitutional:      Appearance: Normal appearance.  HENT:     Head: Normocephalic and atraumatic.  Eyes:     Extraocular Movements: Extraocular movements intact.     Conjunctiva/sclera: Conjunctivae normal.     Pupils: Pupils are equal, round, and reactive to light.  Neck:     Musculoskeletal: Normal range of motion and neck supple.  Cardiovascular:     Rate and Rhythm: Normal rate and regular rhythm.     Pulses: Normal pulses.     Heart sounds: Normal heart sounds.  Pulmonary:     Effort: Pulmonary effort is normal.     Breath sounds: Normal breath sounds.  Abdominal:     Palpations: Abdomen is soft.     Tenderness: There is no abdominal tenderness.  Musculoskeletal: Normal range of motion.     Right lower leg: No edema.      Left lower leg: No edema.  Skin:    General: Skin is warm and dry.     Capillary Refill: Capillary refill takes less than 2 seconds.  Neurological:     General: No focal deficit present.     Mental Status: He is alert and oriented to person, place, and time.  Psychiatric:        Mood and Affect: Mood normal.        Behavior: Behavior normal.      ASSESSMENT & PLAN: Jaamal was seen today for diabetes and hypertension.  Diagnoses and all orders for this visit:  Hypertension associated with type 2 diabetes mellitus ([place]) -     CBC with Differential/Platelet -     Comprehensive metabolic panel -     Hemoglobin A1c -     Lipid panel  CAD in native artery  Pure hypercholesterolemia   Clinically stable.  No medical concerns identified during this visit. Continue present medications.  No changes. Follow-up in 6 months. Blood work done today.   Patient Instructions       If you have lab work done today you will be contacted with your lab results within the next 2 weeks.  If you have not heard from [place] then please contact us. The fastest way to get your results is to register for My Chart.   IF you received an x-ray today, you will receive an invoice from [facility]. Please contact [facility] at [phone] with questions or concerns regarding your invoice.   IF you received labwork today, you will receive an invoice from [place]. Please contact [facility] at [phone] with questions or concerns regarding your invoice.   Our billing staff will not be  able to assist you with questions regarding bills from these companies.  You will be contacted with the lab results as soon as they are available. The fastest way to get your results is to activate your My Chart account. Instructions are located on the last page of this paperwork. If you have not heard from [place] regarding the results in 2 weeks, please contact this office.     Diabetes Mellitus and  Nutrition, Adult When you have diabetes (diabetes mellitus), it is very important to have healthy eating habits because your blood sugar (glucose) levels are greatly affected by what you eat and drink. Eating healthy foods in the appropriate amounts, at about the same times every day, can help you:  Control your blood glucose.  Lower your risk of heart disease.  Improve your blood pressure.  Reach or maintain a healthy weight. Every person with diabetes is different, and each person has different needs for a meal plan. Your health care provider may recommend that you work with a diet and nutrition specialist (dietitian) to make a meal plan that is best for you. Your meal plan may vary depending on factors such as:  The calories you need.  The medicines you take.  Your weight.  Your blood glucose, blood pressure, and cholesterol levels.  Your activity level.  Other health conditions you have, such as heart or kidney disease. How do carbohydrates affect me? Carbohydrates, also called carbs, affect your blood glucose level more than any other type of food. Eating carbs naturally raises the amount of glucose in your blood. Carb counting is a method for keeping track of how many carbs you eat. Counting carbs is important to keep your blood glucose at a healthy level, especially if you use insulin or take certain oral diabetes medicines. It is important to know how many carbs you can safely have in each meal. This is different for every person. Your dietitian can help you calculate how many carbs you should have at each meal and for each snack. Foods that contain carbs include:  Bread, cereal, rice, pasta, and crackers.  Potatoes and corn.  Peas, beans, and lentils.  Milk and yogurt.  Fruit and juice.  Desserts, such as cakes, cookies, ice cream, and candy. How does alcohol affect me? Alcohol can cause a sudden decrease in blood glucose (hypoglycemia), especially if you use insulin  or take certain oral diabetes medicines. Hypoglycemia can be a life-threatening condition. Symptoms of hypoglycemia (sleepiness, dizziness, and confusion) are similar to symptoms of having too much alcohol. If your health care provider says that alcohol is safe for you, follow these guidelines:  Limit alcohol intake to no more than 1 drink per day for nonpregnant women and 2 drinks per day for men. One drink equals 12 oz of beer, 5 oz of wine, or 1 oz of hard liquor.  Do not drink on an empty stomach.  Keep yourself hydrated with water, diet soda, or unsweetened iced tea.  Keep in mind that regular soda, juice, and other mixers may contain a lot of sugar and must be counted as carbs. What are tips for following this plan?  Reading food labels  Start by checking the serving size on the "Nutrition Facts" label of packaged foods and drinks. The amount of calories, carbs, fats, and other nutrients listed on the label is based on one serving of the item. Many items contain more than one serving per package.  Check the total grams (g) of carbs in  one serving. You can calculate the number of servings of carbs in one serving by dividing the total carbs by 15. For example, if a food has 30 g of total carbs, it would be equal to 2 servings of carbs.  Check the number of grams (g) of saturated and trans fats in one serving. Choose foods that have low or no amount of these fats.  Check the number of milligrams (mg) of salt (sodium) in one serving. Most people should limit total sodium intake to less than 2,300 mg per day.  Always check the nutrition information of foods labeled as "low-fat" or "nonfat". These foods may be higher in added sugar or refined carbs and should be avoided.  Talk to your dietitian to identify your daily goals for nutrients listed on the label. Shopping  Avoid buying canned, premade, or processed foods. These foods tend to be high in fat, sodium, and added sugar.  Shop  around the outside edge of the grocery store. This includes fresh fruits and vegetables, bulk grains, fresh meats, and fresh dairy. Cooking  Use low-heat cooking methods, such as baking, instead of high-heat cooking methods like deep frying.  Cook using healthy oils, such as olive, canola, or sunflower oil.  Avoid cooking with butter, cream, or high-fat meats. Meal planning  Eat meals and snacks regularly, preferably at the same times every day. Avoid going long periods of time without eating.  Eat foods high in fiber, such as fresh fruits, vegetables, beans, and whole grains. Talk to your dietitian about how many servings of carbs you can eat at each meal.  Eat 4-6 ounces (oz) of lean protein each day, such as lean meat, chicken, fish, eggs, or tofu. One oz of lean protein is equal to: ? 1 oz of meat, chicken, or fish. ? 1 egg. ?  cup of tofu.  Eat some foods each day that contain healthy fats, such as avocado, nuts, seeds, and fish. Lifestyle  Check your blood glucose regularly.  Exercise regularly as told by your health care provider. This may include: ? 150 minutes of moderate-intensity or vigorous-intensity exercise each week. This could be brisk walking, biking, or water aerobics. ? Stretching and doing strength exercises, such as yoga or weightlifting, at least 2 times a week.  Take medicines as told by your health care provider.  Do not use any products that contain nicotine or tobacco, such as cigarettes and e-cigarettes. If you need help quitting, ask your health care provider.  Work with a Social worker or diabetes educator to identify strategies to manage stress and any emotional and social challenges. Questions to ask a health care provider  Do I need to meet with a diabetes educator?  Do I need to meet with a dietitian?  What number can I call if I have questions?  When are the best times to check my blood glucose? Where to find more information:  American  Diabetes Association: diabetes.org  Academy of Nutrition and Dietetics: www.eatright.[employer] of Diabetes and Digestive and Kidney Diseases (NIH): [employer] Summary  A healthy meal plan will help you control your blood glucose and maintain a healthy lifestyle.  Working with a diet and nutrition specialist (dietitian) can help you make a meal plan that is best for you.  Keep in mind that carbohydrates (carbs) and alcohol have immediate effects on your blood glucose levels. It is important to count carbs and to use alcohol carefully. This information is not intended to replace advice given  to you by your health care provider. Make sure you discuss any questions you have with your health care provider. Document Released: 02/17/2005 Document Revised: 05/05/2017 Document Reviewed: 06/27/2016 Elsevier Patient Education  2020 [employer].  Hypertension, Adult High blood pressure (hypertension) is when the force of blood pumping through the arteries is too strong. The arteries are the blood vessels that carry blood from the heart throughout the body. Hypertension forces the heart to work harder to pump blood and may cause arteries to become narrow or stiff. Untreated or uncontrolled hypertension can cause a heart attack, heart failure, a stroke, kidney disease, and other problems. A blood pressure reading consists of a higher number over a lower number. Ideally, your blood pressure should be below 120/80. The first ("top") number is called the systolic pressure. It is a measure of the pressure in your arteries as your heart beats. The second ("bottom") number is called the diastolic pressure. It is a measure of the pressure in your arteries as the heart relaxes. What are the causes? The exact cause of this condition is not known. There are some conditions that result in or are related to high blood pressure. What increases the risk? Some risk factors for high blood pressure are  under your control. The following factors may make you more likely to develop this condition:  Smoking.  Having type 2 diabetes mellitus, high cholesterol, or both.  Not getting enough exercise or physical activity.  Being overweight.  Having too much fat, sugar, calories, or salt (sodium) in your diet.  Drinking too much alcohol. Some risk factors for high blood pressure may be difficult or impossible to change. Some of these factors include:  Having chronic kidney disease.  Having a family history of high blood pressure.  Age. Risk increases with age.  Race. You may be at higher risk if you are African American.  Gender. Men are at higher risk than women before age 35. After age 58, women are at higher risk than men.  Having obstructive sleep apnea.  Stress. What are the signs or symptoms? High blood pressure may not cause symptoms. Very high blood pressure (hypertensive crisis) may cause:  Headache.  Anxiety.  Shortness of breath.  Nosebleed.  Nausea and vomiting.  Vision changes.  Severe chest pain.  Seizures. How is this diagnosed? This condition is diagnosed by measuring your blood pressure while you are seated, with your arm resting on a flat surface, your legs uncrossed, and your feet flat on the floor. The cuff of the blood pressure monitor will be placed directly against the skin of your upper arm at the level of your heart. It should be measured at least twice using the same arm. Certain conditions can cause a difference in blood pressure between your right and left arms. Certain factors can cause blood pressure readings to be lower or higher than normal for a short period of time:  When your blood pressure is higher when you are in a health care provider's office than when you are at home, this is called white coat hypertension. Most people with this condition do not need medicines.  When your blood pressure is higher at home than when you are in a  health care provider's office, this is called masked hypertension. Most people with this condition may need medicines to control blood pressure. If you have a high blood pressure reading during one visit or you have normal blood pressure with other risk factors, you may be asked  to:  Return on a different day to have your blood pressure checked again.  Monitor your blood pressure at home for 1 week or longer. If you are diagnosed with hypertension, you may have other blood or imaging tests to help your health care provider understand your overall risk for other conditions. How is this treated? This condition is treated by making healthy lifestyle changes, such as eating healthy foods, exercising more, and reducing your alcohol intake. Your health care provider may prescribe medicine if lifestyle changes are not enough to get your blood pressure under control, and if:  Your systolic blood pressure is above 130.  Your diastolic blood pressure is above 80. Your personal target blood pressure may vary depending on your medical conditions, your age, and other factors. Follow these instructions at home: Eating and drinking   Eat a diet that is high in fiber and potassium, and low in sodium, added sugar, and fat. An example eating plan is called the DASH (Dietary Approaches to Stop Hypertension) diet. To eat this way: ? Eat plenty of fresh fruits and vegetables. Try to fill one half of your plate at each meal with fruits and vegetables. ? Eat whole grains, such as whole-wheat pasta, brown rice, or whole-grain bread. Fill about one fourth of your plate with whole grains. ? Eat or drink low-fat dairy products, such as skim milk or low-fat yogurt. ? Avoid fatty cuts of meat, processed or cured meats, and poultry with skin. Fill about one fourth of your plate with lean proteins, such as fish, chicken without skin, beans, eggs, or tofu. ? Avoid pre-made and processed foods. These tend to be higher in  sodium, added sugar, and fat.  Reduce your daily sodium intake. Most people with hypertension should eat less than 1,500 mg of sodium a day.  Do not drink alcohol if: ? Your health care provider tells you not to drink. ? You are pregnant, may be pregnant, or are planning to become pregnant.  If you drink alcohol: ? Limit how much you use to:  0-1 drink a day for women.  0-2 drinks a day for men. ? Be aware of how much alcohol is in your drink. In the [place] one drink equals one 12 oz bottle of beer (355 mL), one 5 oz glass of wine (148 mL), or one 1 oz glass of hard liquor (44 mL). Lifestyle   Work with your health care provider to maintain a healthy body weight or to lose weight. Ask what an ideal weight is for you.  Get at least 30 minutes of exercise most days of the week. Activities may include walking, swimming, or biking.  Include exercise to strengthen your muscles (resistance exercise), such as Pilates or lifting weights, as part of your weekly exercise routine. Try to do these types of exercises for 30 minutes at least 3 days a week.  Do not use any products that contain nicotine or tobacco, such as cigarettes, e-cigarettes, and chewing tobacco. If you need help quitting, ask your health care provider.  Monitor your blood pressure at home as told by your health care provider.  Keep all follow-up visits as told by your health care provider. This is important. Medicines  Take over-the-counter and prescription medicines only as told by your health care provider. Follow directions carefully. Blood pressure medicines must be taken as prescribed.  Do not skip doses of blood pressure medicine. Doing this puts you at risk for problems and can make the medicine less  effective.  Ask your health care provider about side effects or reactions to medicines that you should watch for. Contact a health care provider if you:  Think you are having a reaction to a medicine you are  taking.  Have headaches that keep coming back (recurring).  Feel dizzy.  Have swelling in your ankles.  Have trouble with your vision. Get help right away if you:  Develop a severe headache or confusion.  Have unusual weakness or numbness.  Feel faint.  Have severe pain in your chest or abdomen.  Vomit repeatedly.  Have trouble breathing. Summary  Hypertension is when the force of blood pumping through your arteries is too strong. If this condition is not controlled, it may put you at risk for serious complications.  Your personal target blood pressure may vary depending on your medical conditions, your age, and other factors. For most people, a normal blood pressure is less than 120/80.  Hypertension is treated with lifestyle changes, medicines, or a combination of both. Lifestyle changes include losing weight, eating a healthy, low-sodium diet, exercising more, and limiting alcohol. This information is not intended to replace advice given to you by your health care provider. Make sure you discuss any questions you have with your health care provider. Document Released: 05/23/2005 Document Revised: 01/31/2018 Document Reviewed: 01/31/2018 Elsevier Patient Education  2020 [employer].      Agustina Caroli, MD [facility]

## 2019-01-15 NOTE — Patient Instructions (Addendum)
   If you have lab work done today you will be contacted with your lab results within the next 2 weeks.  If you have not heard from us then please contact us. The fastest way to get your results is to register for My Chart.   IF you received an x-ray today, you will receive an invoice from [facility]. Please contact [facility] at [phone] with questions or concerns regarding your invoice.   IF you received labwork today, you will receive an invoice from [facility]. Please contact [facility] at [phone] with questions or concerns regarding your invoice.   Our billing staff will not be able to assist you with questions regarding bills from these companies.  You will be contacted with the lab results as soon as they are available. The fastest way to get your results is to activate your My Chart account. Instructions are located on the last page of this paperwork. If you have not heard from us regarding the results in 2 weeks, please contact this office.     Diabetes Mellitus and Nutrition, Adult When you have diabetes (diabetes mellitus), it is very important to have healthy eating habits because your blood sugar (glucose) levels are greatly affected by what you eat and drink. Eating healthy foods in the appropriate amounts, at about the same times every day, can help you:  Control your blood glucose.  Lower your risk of heart disease.  Improve your blood pressure.  Reach or maintain a healthy weight. Every person with diabetes is different, and each person has different needs for a meal plan. Your health care provider may recommend that you work with a diet and nutrition specialist (dietitian) to make a meal plan that is best for you. Your meal plan may vary depending on factors such as:  The calories you need.  The medicines you take.  Your weight.  Your blood glucose, blood pressure, and cholesterol levels.  Your activity level.  Other health conditions  you have, such as heart or kidney disease. How do carbohydrates affect me? Carbohydrates, also called carbs, affect your blood glucose level more than any other type of food. Eating carbs naturally raises the amount of glucose in your blood. Carb counting is a method for keeping track of how many carbs you eat. Counting carbs is important to keep your blood glucose at a healthy level, especially if you use insulin or take certain oral diabetes medicines. It is important to know how many carbs you can safely have in each meal. This is different for every person. Your dietitian can help you calculate how many carbs you should have at each meal and for each snack. Foods that contain carbs include:  Bread, cereal, rice, pasta, and crackers.  Potatoes and corn.  Peas, beans, and lentils.  Milk and yogurt.  Fruit and juice.  Desserts, such as cakes, cookies, ice cream, and candy. How does alcohol affect me? Alcohol can cause a sudden decrease in blood glucose (hypoglycemia), especially if you use insulin or take certain oral diabetes medicines. Hypoglycemia can be a life-threatening condition. Symptoms of hypoglycemia (sleepiness, dizziness, and confusion) are similar to symptoms of having too much alcohol. If your health care provider says that alcohol is safe for you, follow these guidelines:  Limit alcohol intake to no more than 1 drink per day for nonpregnant women and 2 drinks per day for men. One drink equals 12 oz of beer, 5 oz of wine, or 1 oz of hard liquor.    Do not drink on an empty stomach.  Keep yourself hydrated with water, diet soda, or unsweetened iced tea.  Keep in mind that regular soda, juice, and other mixers may contain a lot of sugar and must be counted as carbs. What are tips for following this plan?  Reading food labels  Start by checking the serving size on the "Nutrition Facts" label of packaged foods and drinks. The amount of calories, carbs, fats, and other  nutrients listed on the label is based on one serving of the item. Many items contain more than one serving per package.  Check the total grams (g) of carbs in one serving. You can calculate the number of servings of carbs in one serving by dividing the total carbs by 15. For example, if a food has 30 g of total carbs, it would be equal to 2 servings of carbs.  Check the number of grams (g) of saturated and trans fats in one serving. Choose foods that have low or no amount of these fats.  Check the number of milligrams (mg) of salt (sodium) in one serving. Most people should limit total sodium intake to less than 2,300 mg per day.  Always check the nutrition information of foods labeled as "low-fat" or "nonfat". These foods may be higher in added sugar or refined carbs and should be avoided.  Talk to your dietitian to identify your daily goals for nutrients listed on the label. Shopping  Avoid buying canned, premade, or processed foods. These foods tend to be high in fat, sodium, and added sugar.  Shop around the outside edge of the grocery store. This includes fresh fruits and vegetables, bulk grains, fresh meats, and fresh dairy. Cooking  Use low-heat cooking methods, such as baking, instead of high-heat cooking methods like deep frying.  Cook using healthy oils, such as olive, canola, or sunflower oil.  Avoid cooking with butter, cream, or high-fat meats. Meal planning  Eat meals and snacks regularly, preferably at the same times every day. Avoid going long periods of time without eating.  Eat foods high in fiber, such as fresh fruits, vegetables, beans, and whole grains. Talk to your dietitian about how many servings of carbs you can eat at each meal.  Eat 4-6 ounces (oz) of lean protein each day, such as lean meat, chicken, fish, eggs, or tofu. One oz of lean protein is equal to: ? 1 oz of meat, chicken, or fish. ? 1 egg. ?  cup of tofu.  Eat some foods each day that contain  healthy fats, such as avocado, nuts, seeds, and fish. Lifestyle  Check your blood glucose regularly.  Exercise regularly as told by your health care provider. This may include: ? 150 minutes of moderate-intensity or vigorous-intensity exercise each week. This could be brisk walking, biking, or water aerobics. ? Stretching and doing strength exercises, such as yoga or weightlifting, at least 2 times a week.  Take medicines as told by your health care provider.  Do not use any products that contain nicotine or tobacco, such as cigarettes and e-cigarettes. If you need help quitting, ask your health care provider.  Work with a counselor or diabetes educator to identify strategies to manage stress and any emotional and social challenges. Questions to ask a health care provider  Do I need to meet with a diabetes educator?  Do I need to meet with a dietitian?  What number can I call if I have questions?  When are the best times to   check my blood glucose? Where to find more information:  American Diabetes Association: diabetes.org  Academy of Nutrition and Dietetics: www.eatright.org  National Institute of Diabetes and Digestive and Kidney Diseases (NIH): www.niddk.nih.gov Summary  A healthy meal plan will help you control your blood glucose and maintain a healthy lifestyle.  Working with a diet and nutrition specialist (dietitian) can help you make a meal plan that is best for you.  Keep in mind that carbohydrates (carbs) and alcohol have immediate effects on your blood glucose levels. It is important to count carbs and to use alcohol carefully. This information is not intended to replace advice given to you by your health care provider. Make sure you discuss any questions you have with your health care provider. Document Released: 02/17/2005 Document Revised: 05/05/2017 Document Reviewed: 06/27/2016 Elsevier Patient Education  2020 Elsevier Inc.  Hypertension, Adult High blood  pressure (hypertension) is when the force of blood pumping through the arteries is too strong. The arteries are the blood vessels that carry blood from the heart throughout the body. Hypertension forces the heart to work harder to pump blood and may cause arteries to become narrow or stiff. Untreated or uncontrolled hypertension can cause a heart attack, heart failure, a stroke, kidney disease, and other problems. A blood pressure reading consists of a higher number over a lower number. Ideally, your blood pressure should be below 120/80. The first ("top") number is called the systolic pressure. It is a measure of the pressure in your arteries as your heart beats. The second ("bottom") number is called the diastolic pressure. It is a measure of the pressure in your arteries as the heart relaxes. What are the causes? The exact cause of this condition is not known. There are some conditions that result in or are related to high blood pressure. What increases the risk? Some risk factors for high blood pressure are under your control. The following factors may make you more likely to develop this condition:  Smoking.  Having type 2 diabetes mellitus, high cholesterol, or both.  Not getting enough exercise or physical activity.  Being overweight.  Having too much fat, sugar, calories, or salt (sodium) in your diet.  Drinking too much alcohol. Some risk factors for high blood pressure may be difficult or impossible to change. Some of these factors include:  Having chronic kidney disease.  Having a family history of high blood pressure.  Age. Risk increases with age.  Race. You may be at higher risk if you are African American.  Gender. Men are at higher risk than women before age 45. After age 65, women are at higher risk than men.  Having obstructive sleep apnea.  Stress. What are the signs or symptoms? High blood pressure may not cause symptoms. Very high blood pressure (hypertensive  crisis) may cause:  Headache.  Anxiety.  Shortness of breath.  Nosebleed.  Nausea and vomiting.  Vision changes.  Severe chest pain.  Seizures. How is this diagnosed? This condition is diagnosed by measuring your blood pressure while you are seated, with your arm resting on a flat surface, your legs uncrossed, and your feet flat on the floor. The cuff of the blood pressure monitor will be placed directly against the skin of your upper arm at the level of your heart. It should be measured at least twice using the same arm. Certain conditions can cause a difference in blood pressure between your right and left arms. Certain factors can cause blood pressure readings to be   lower or higher than normal for a short period of time:  When your blood pressure is higher when you are in a health care provider's office than when you are at home, this is called white coat hypertension. Most people with this condition do not need medicines.  When your blood pressure is higher at home than when you are in a health care provider's office, this is called masked hypertension. Most people with this condition may need medicines to control blood pressure. If you have a high blood pressure reading during one visit or you have normal blood pressure with other risk factors, you may be asked to:  Return on a different day to have your blood pressure checked again.  Monitor your blood pressure at home for 1 week or longer. If you are diagnosed with hypertension, you may have other blood or imaging tests to help your health care provider understand your overall risk for other conditions. How is this treated? This condition is treated by making healthy lifestyle changes, such as eating healthy foods, exercising more, and reducing your alcohol intake. Your health care provider may prescribe medicine if lifestyle changes are not enough to get your blood pressure under control, and if:  Your systolic blood pressure  is above 130.  Your diastolic blood pressure is above 80. Your personal target blood pressure may vary depending on your medical conditions, your age, and other factors. Follow these instructions at home: Eating and drinking   Eat a diet that is high in fiber and potassium, and low in sodium, added sugar, and fat. An example eating plan is called the DASH (Dietary Approaches to Stop Hypertension) diet. To eat this way: ? Eat plenty of fresh fruits and vegetables. Try to fill one half of your plate at each meal with fruits and vegetables. ? Eat whole grains, such as whole-wheat pasta, brown rice, or whole-grain bread. Fill about one fourth of your plate with whole grains. ? Eat or drink low-fat dairy products, such as skim milk or low-fat yogurt. ? Avoid fatty cuts of meat, processed or cured meats, and poultry with skin. Fill about one fourth of your plate with lean proteins, such as fish, chicken without skin, beans, eggs, or tofu. ? Avoid pre-made and processed foods. These tend to be higher in sodium, added sugar, and fat.  Reduce your daily sodium intake. Most people with hypertension should eat less than 1,500 mg of sodium a day.  Do not drink alcohol if: ? Your health care provider tells you not to drink. ? You are pregnant, may be pregnant, or are planning to become pregnant.  If you drink alcohol: ? Limit how much you use to:  0-1 drink a day for women.  0-2 drinks a day for men. ? Be aware of how much alcohol is in your drink. In the [place] one drink equals one 12 oz bottle of beer (355 mL), one 5 oz glass of wine (148 mL), or one 1 oz glass of hard liquor (44 mL). Lifestyle   Work with your health care provider to maintain a healthy body weight or to lose weight. Ask what an ideal weight is for you.  Get at least 30 minutes of exercise most days of the week. Activities may include walking, swimming, or biking.  Include exercise to strengthen your muscles (resistance  exercise), such as Pilates or lifting weights, as part of your weekly exercise routine. Try to do these types of exercises for 30 minutes at least   3 days a week.  Do not use any products that contain nicotine or tobacco, such as cigarettes, e-cigarettes, and chewing tobacco. If you need help quitting, ask your health care provider.  Monitor your blood pressure at home as told by your health care provider.  Keep all follow-up visits as told by your health care provider. This is important. Medicines  Take over-the-counter and prescription medicines only as told by your health care provider. Follow directions carefully. Blood pressure medicines must be taken as prescribed.  Do not skip doses of blood pressure medicine. Doing this puts you at risk for problems and can make the medicine less effective.  Ask your health care provider about side effects or reactions to medicines that you should watch for. Contact a health care provider if you:  Think you are having a reaction to a medicine you are taking.  Have headaches that keep coming back (recurring).  Feel dizzy.  Have swelling in your ankles.  Have trouble with your vision. Get help right away if you:  Develop a severe headache or confusion.  Have unusual weakness or numbness.  Feel faint.  Have severe pain in your chest or abdomen.  Vomit repeatedly.  Have trouble breathing. Summary  Hypertension is when the force of blood pumping through your arteries is too strong. If this condition is not controlled, it may put you at risk for serious complications.  Your personal target blood pressure may vary depending on your medical conditions, your age, and other factors. For most people, a normal blood pressure is less than 120/80.  Hypertension is treated with lifestyle changes, medicines, or a combination of both. Lifestyle changes include losing weight, eating a healthy, low-sodium diet, exercising more, and limiting  alcohol. This information is not intended to replace advice given to you by your health care provider. Make sure you discuss any questions you have with your health care provider. Document Released: 05/23/2005 Document Revised: 01/31/2018 Document Reviewed: 01/31/2018 Elsevier Patient Education  2020 Elsevier Inc.  

## 2019-01-16 ENCOUNTER — Encounter: Payer: Self-pay | Admitting: Emergency Medicine

## 2019-01-16 LAB — CBC WITH DIFFERENTIAL/PLATELET
Basophils Absolute: 0 10*3/uL (ref 0.0–0.2)
Basos: 1 %
EOS (ABSOLUTE): 0.2 10*3/uL (ref 0.0–0.4)
Eos: 2 %
Hematocrit: 39.7 % (ref 37.5–51.0)
Hemoglobin: 13.4 g/dL (ref 13.0–17.7)
Immature Grans (Abs): 0 10*3/uL (ref 0.0–0.1)
Immature Granulocytes: 0 %
Lymphocytes Absolute: 2.1 10*3/uL (ref 0.7–3.1)
Lymphs: 26 %
MCH: 29.1 pg (ref 26.6–33.0)
MCHC: 33.8 g/dL (ref 31.5–35.7)
MCV: 86 fL (ref 79–97)
Monocytes Absolute: 0.6 10*3/uL (ref 0.1–0.9)
Monocytes: 8 %
Neutrophils Absolute: 5.1 10*3/uL (ref 1.4–7.0)
Neutrophils: 63 %
Platelets: 207 10*3/uL (ref 150–450)
RBC: 4.61 x10E6/uL (ref 4.14–5.80)
RDW: 13.9 % (ref 11.6–15.4)
WBC: 8 10*3/uL (ref 3.4–10.8)

## 2019-01-16 LAB — COMPREHENSIVE METABOLIC PANEL
ALT: 28 IU/L (ref 0–44)
AST: 24 IU/L (ref 0–40)
Albumin/Globulin Ratio: 2.2 (ref 1.2–2.2)
Albumin: 4.8 g/dL (ref 3.8–4.9)
Alkaline Phosphatase: 51 IU/L (ref 39–117)
BUN/Creatinine Ratio: 13 (ref 9–20)
BUN: 15 mg/dL (ref 6–24)
Bilirubin Total: 0.8 mg/dL (ref 0.0–1.2)
CO2: 22 mmol/L (ref 20–29)
Calcium: 10 mg/dL (ref 8.7–10.2)
Chloride: 102 mmol/L (ref 96–106)
Creatinine, Ser: 1.12 mg/dL (ref 0.76–1.27)
GFR calc Af Amer: 87 mL/min/{1.73_m2} (ref >59)
GFR calc non Af Amer: 75 mL/min/{1.73_m2} (ref >59)
Globulin, Total: 2.2 g/dL (ref 1.5–4.5)
Glucose: 129 mg/dL — ABNORMAL HIGH (ref 65–99)
Potassium: 4 mmol/L (ref 3.5–5.2)
Sodium: 141 mmol/L (ref 134–144)
Total Protein: 7 g/dL (ref 6.0–8.5)

## 2019-01-16 LAB — LIPID PANEL
Chol/HDL Ratio: 3.2 ratio (ref 0.0–5.0)
Cholesterol, Total: 120 mg/dL (ref 100–199)
HDL: 38 mg/dL — ABNORMAL LOW (ref >39)
LDL Calculated: 55 mg/dL (ref 0–99)
Triglycerides: 137 mg/dL (ref 0–149)
VLDL Cholesterol Cal: 27 mg/dL (ref 5–40)

## 2019-01-16 LAB — HEMOGLOBIN A1C
Est. average glucose Bld gHb Est-mCnc: 143 mg/dL
Hgb A1c MFr Bld: 6.6 % — ABNORMAL HIGH (ref 4.8–5.6)

## 2019-01-30 ENCOUNTER — Other Ambulatory Visit: Payer: Self-pay | Admitting: Cardiology

## 2019-02-06 ENCOUNTER — Other Ambulatory Visit: Payer: Self-pay | Admitting: Family Medicine

## 2019-03-19 ENCOUNTER — Other Ambulatory Visit: Payer: Self-pay | Admitting: Family Medicine

## 2019-03-19 IMAGING — CR DG CHEST 2V
2 series · 2 of 2 positions shown · non-contrast
Comparison: PA and lateral chest x-ray dated August 15, 2017

CLINICAL DATA: Shortness of breath and chest pain began today while
at work. History of hypertension, diabetes, coronary artery disease,
current smoker.

EXAM:
CHEST - 2 VIEW

[w chest pa]
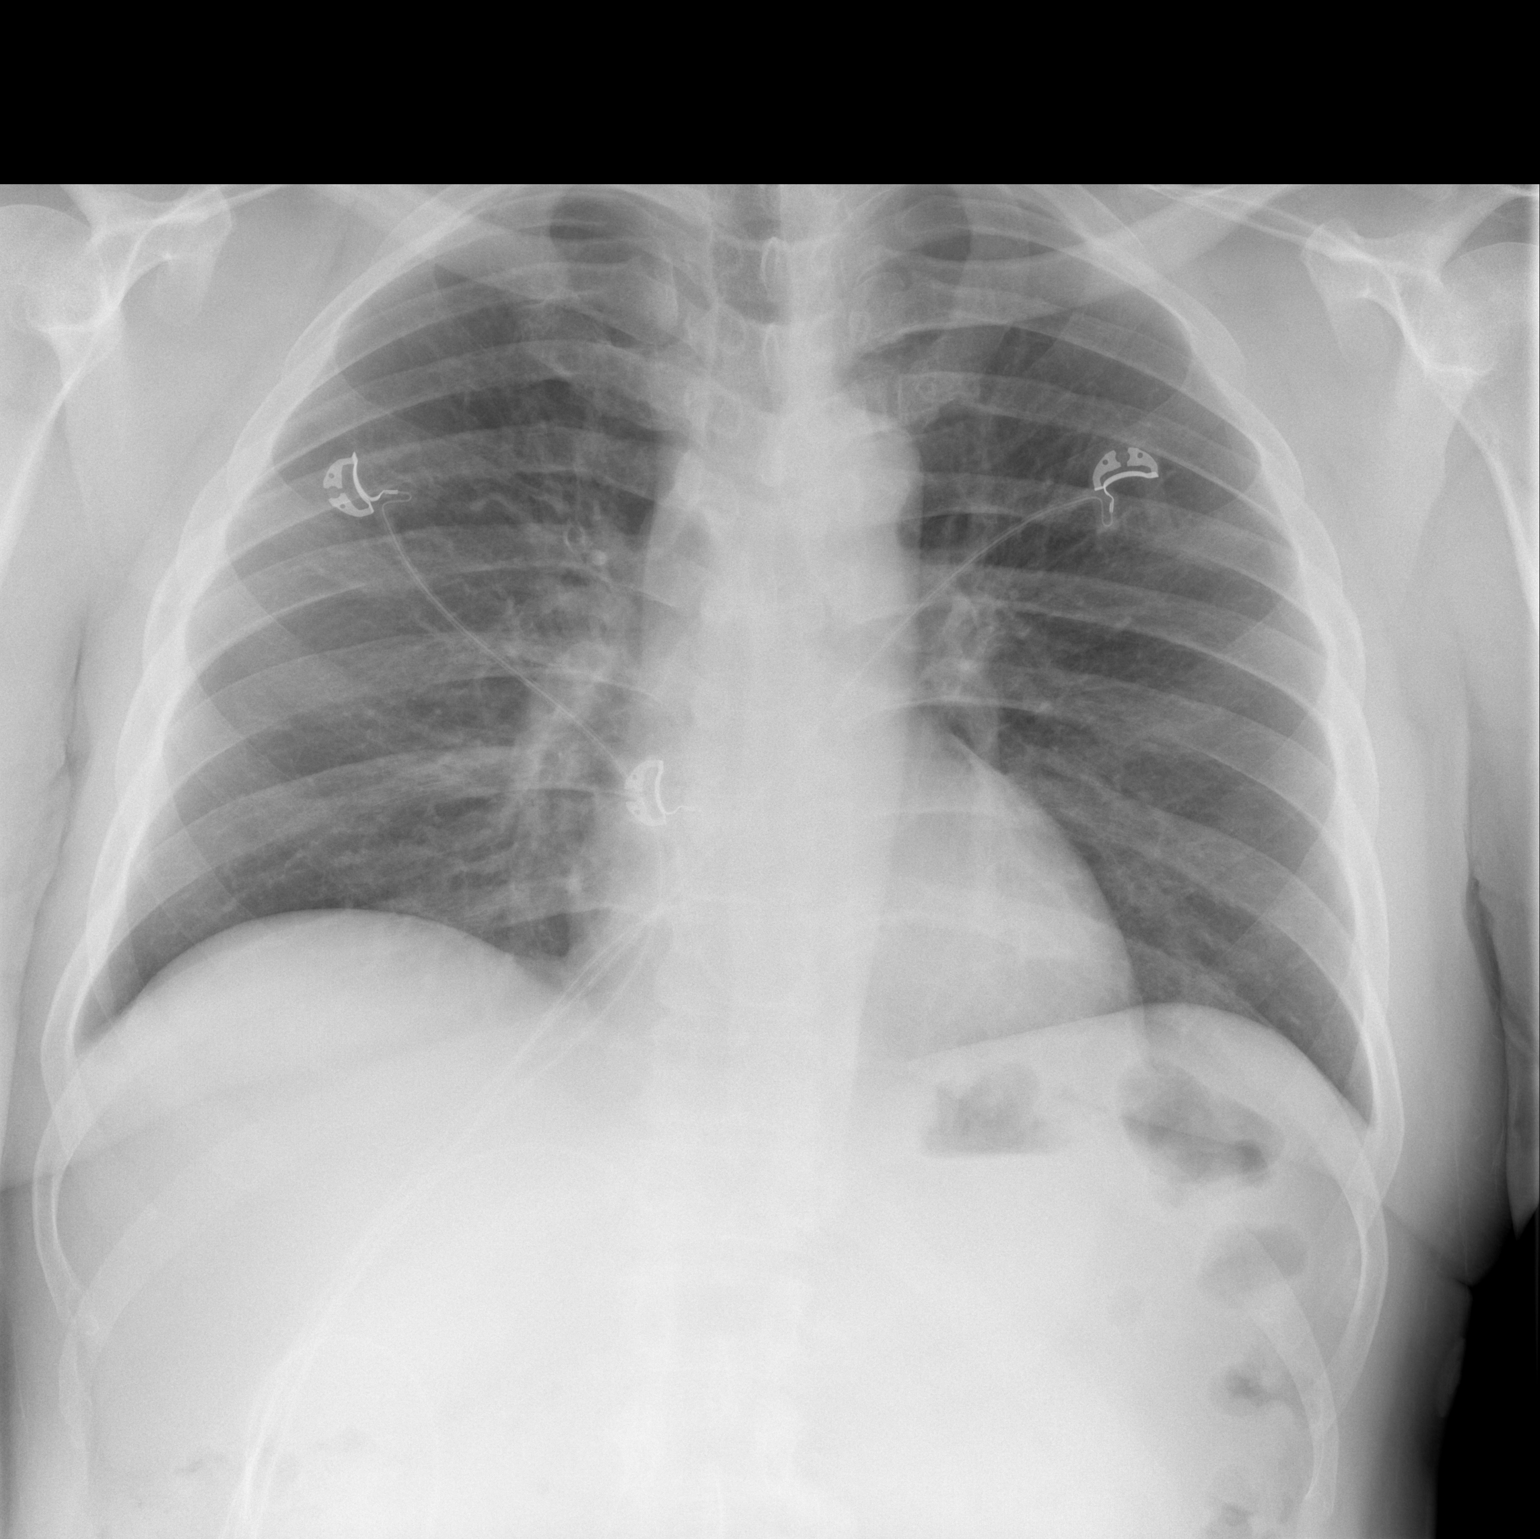

[w chest lat]
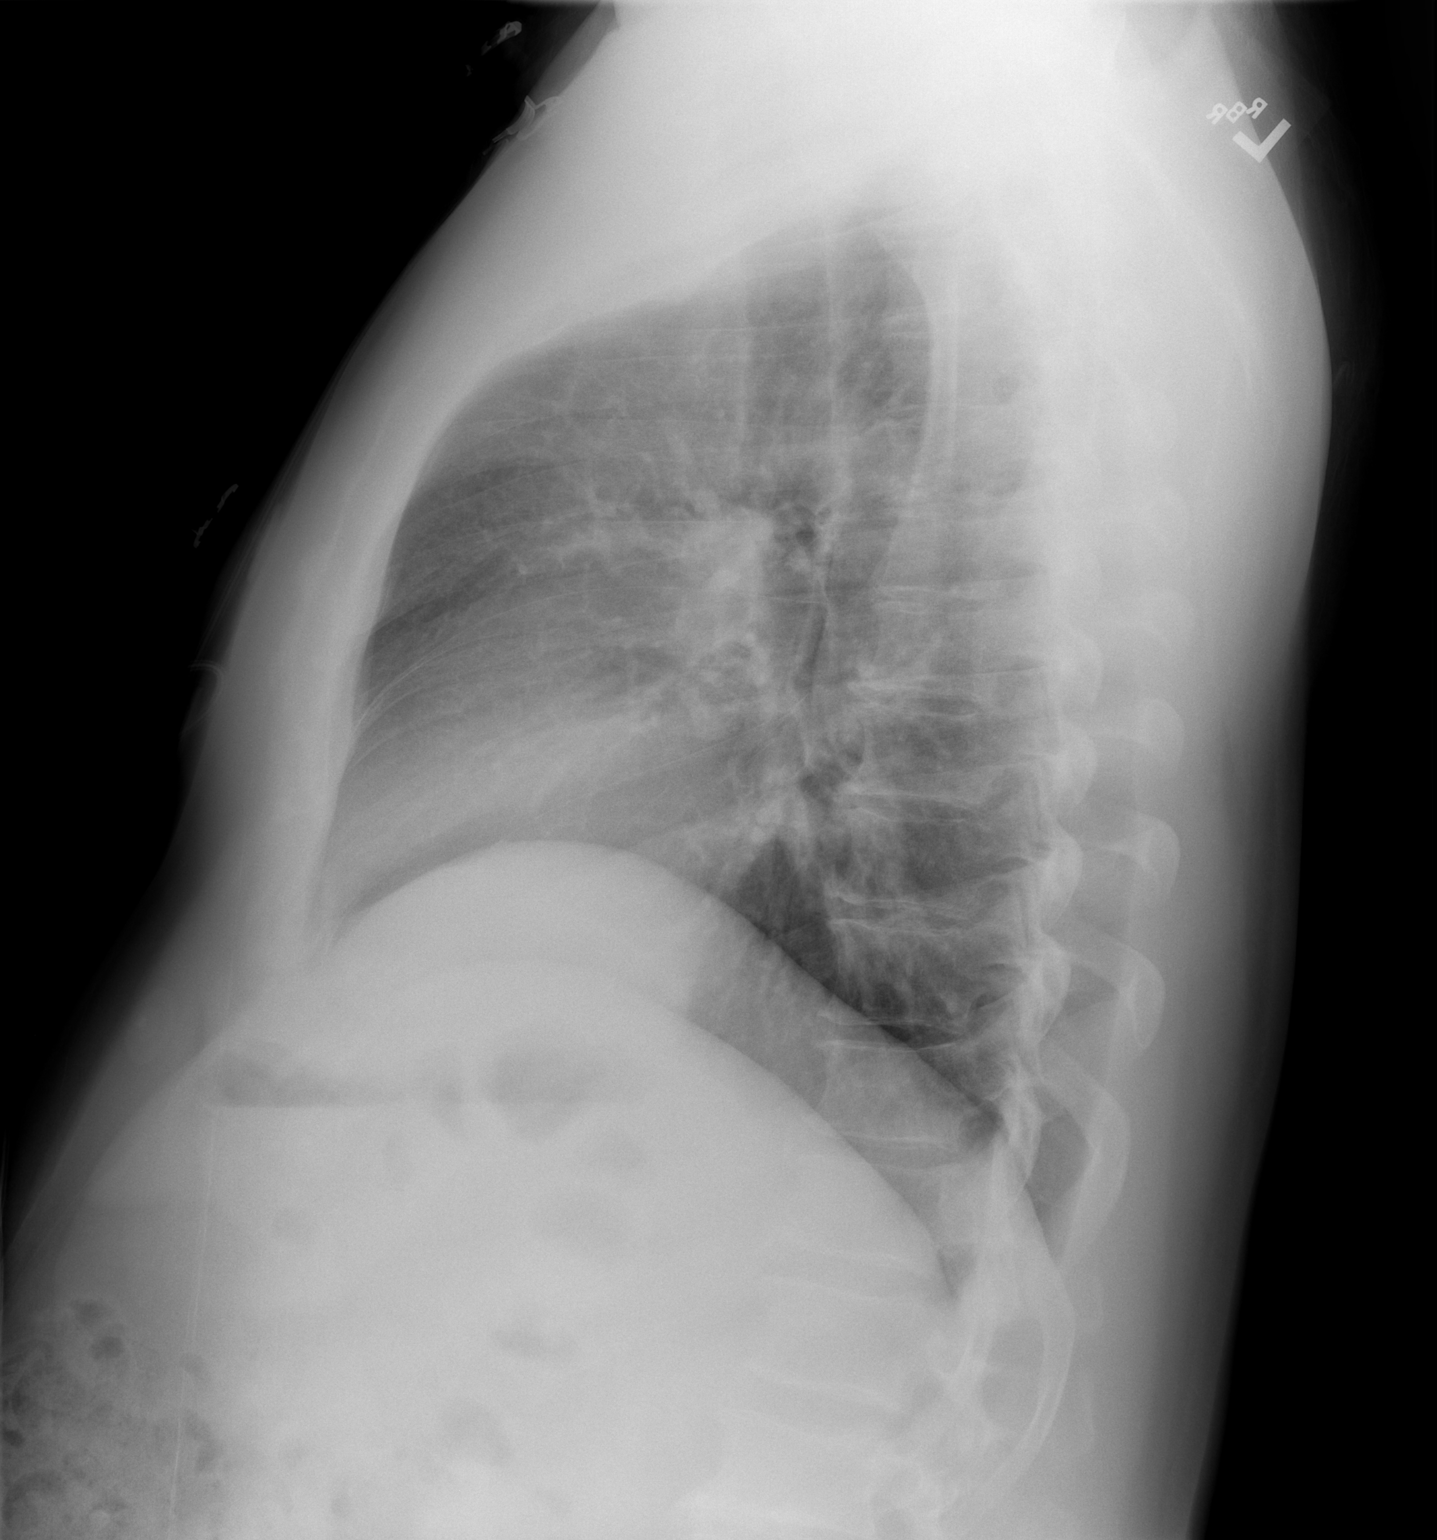

[2 of 2 positions shown; findings below may reference images not displayed]

FINDINGS: The lungs are adequately inflated. There is no focal infiltrate.
There is no pleural effusion. The heart and pulmonary vascularity
are normal. The mediastinum is normal in width. The trachea is
midline. The bony thorax exhibits no acute abnormality.
IMPRESSION: There is no pneumonia nor other acute cardiopulmonary abnormality.

## 2019-03-26 ENCOUNTER — Other Ambulatory Visit: Payer: Self-pay

## 2019-03-26 DIAGNOSIS — Z20822 Contact with and (suspected) exposure to covid-19: Secondary | ICD-10-CM

## 2019-03-28 LAB — NOVEL CORONAVIRUS, NAA: SARS-CoV-2, NAA: NOT DETECTED

## 2019-04-12 ENCOUNTER — Other Ambulatory Visit: Payer: Self-pay | Admitting: Emergency Medicine

## 2019-04-22 ENCOUNTER — Emergency Department (HOSPITAL_BASED_OUTPATIENT_CLINIC_OR_DEPARTMENT_OTHER): Payer: No Typology Code available for payment source

## 2019-04-22 ENCOUNTER — Encounter (HOSPITAL_BASED_OUTPATIENT_CLINIC_OR_DEPARTMENT_OTHER): Payer: Self-pay

## 2019-04-22 ENCOUNTER — Other Ambulatory Visit: Payer: Self-pay

## 2019-04-22 ENCOUNTER — Emergency Department (HOSPITAL_BASED_OUTPATIENT_CLINIC_OR_DEPARTMENT_OTHER)
Admission: EM | Admit: 2019-04-22 | Discharge: 2019-04-22 | Disposition: A | Discharge status: Home / Self Care | Payer: No Typology Code available for payment source | Attending: Emergency Medicine | Admitting: Emergency Medicine

## 2019-04-22 DIAGNOSIS — I2510 Atherosclerotic heart disease of native coronary artery without angina pectoris: Secondary | ICD-10-CM | POA: Diagnosis not present

## 2019-04-22 DIAGNOSIS — R079 Chest pain, unspecified: Secondary | ICD-10-CM | POA: Diagnosis not present

## 2019-04-22 DIAGNOSIS — I10 Essential (primary) hypertension: Secondary | ICD-10-CM | POA: Insufficient documentation

## 2019-04-22 DIAGNOSIS — Z20828 Contact with and (suspected) exposure to other viral communicable diseases: Secondary | ICD-10-CM

## 2019-04-22 DIAGNOSIS — Z20822 Contact with and (suspected) exposure to covid-19: Secondary | ICD-10-CM

## 2019-04-22 DIAGNOSIS — U071 COVID-19: Secondary | ICD-10-CM | POA: Diagnosis not present

## 2019-04-22 DIAGNOSIS — F17210 Nicotine dependence, cigarettes, uncomplicated: Secondary | ICD-10-CM | POA: Diagnosis not present

## 2019-04-22 DIAGNOSIS — Z79899 Other long term (current) drug therapy: Secondary | ICD-10-CM | POA: Diagnosis not present

## 2019-04-22 DIAGNOSIS — Z7982 Long term (current) use of aspirin: Secondary | ICD-10-CM | POA: Diagnosis not present

## 2019-04-22 DIAGNOSIS — M549 Dorsalgia, unspecified: Secondary | ICD-10-CM

## 2019-04-22 DIAGNOSIS — E119 Type 2 diabetes mellitus without complications: Secondary | ICD-10-CM | POA: Insufficient documentation

## 2019-04-22 LAB — BASIC METABOLIC PANEL
Anion gap: 11 (ref 5–15)
BUN: 13 mg/dL (ref 6–20)
CO2: 22 mmol/L (ref 22–32)
Calcium: 9.7 mg/dL (ref 8.9–10.3)
Chloride: 109 mmol/L (ref 98–111)
Creatinine, Ser: 1.17 mg/dL (ref 0.61–1.24)
GFR calc Af Amer: >60 mL/min (ref >60)
GFR calc non Af Amer: >60 mL/min (ref >60)
Glucose, Bld: 128 mg/dL — ABNORMAL HIGH (ref 70–99)
Potassium: 3.8 mmol/L (ref 3.5–5.1)
Sodium: 142 mmol/L (ref 135–145)

## 2019-04-22 LAB — CBC
HCT: 43.7 % (ref 39.0–52.0)
Hemoglobin: 14.1 g/dL (ref 13.0–17.0)
MCH: 28.9 pg (ref 26.0–34.0)
MCHC: 32.3 g/dL (ref 30.0–36.0)
MCV: 89.5 fL (ref 80.0–100.0)
Platelets: 180 10*3/uL (ref 150–400)
RBC: 4.88 MIL/uL (ref 4.22–5.81)
RDW: 15.5 % (ref 11.5–15.5)
WBC: 6.3 10*3/uL (ref 4.0–10.5)
nRBC: 0 % (ref 0.0–0.2)

## 2019-04-22 LAB — TROPONIN I (HIGH SENSITIVITY)
Troponin I (High Sensitivity): 8 ng/L (ref <18)
Troponin I (High Sensitivity): 8 ng/L (ref <18)

## 2019-04-22 MED ORDER — IOHEXOL 350 MG/ML SOLN
100.0000 mL | Freq: Once | INTRAVENOUS | Status: AC | PRN
  Administered 2019-04-22: 100 mL via INTRAVENOUS

## 2019-04-22 MED ORDER — ASPIRIN 81 MG PO CHEW
243.0000 mg | CHEWABLE_TABLET | Freq: Once | ORAL | Status: AC
  Administered 2019-04-22: 243 mg via ORAL

## 2019-04-22 MED ORDER — ASPIRIN 81 MG PO CHEW
324.0000 mg | CHEWABLE_TABLET | Freq: Once | ORAL | Status: DC
  Filled 2019-04-22: qty 4

## 2019-04-22 NOTE — ED Notes (Signed)
Patient transported to CT 

## 2019-04-22 NOTE — Discharge Instructions (Signed)
You were seen in the emergency department for some upper back pain and chest pain along with possible Covid exposure.  You had blood work EKG chest x-ray and a CAT scan of your chest that did not show any serious findings.  Your Covid testing was pending at time of discharge and should result in the next 1 to 2 days.  Please isolate until the results of your testing are back.

## 2019-04-22 NOTE — ED Triage Notes (Signed)
Pt reports that he developed back pain radiating into his right chest on Saturday, has had chills, has not taken temp at home. States this morning with breakfast he noticed that he had loss of smell. Pt reports that he has worked with 2 people that have been positive for covid. Ambulatory to room, NAD.

## 2019-04-22 NOTE — ED Provider Notes (Signed)
[facility] EMERGENCY DEPARTMENT Provider Note   CSN: [id number] Arrival date & time: 04/22/19  0720     History   Chief Complaint Chief Complaint  Patient presents with  . Chest Pain  . loss of taste    HPI Clifford Frederick is a 53 y.o. male.  He has a prior history of coronary disease with stent.  He is complaining of some vague upper back pain radiating into his right chest that started 2 days ago.  He has felt hot and had some chills.  He has had a nonproductive cough minimal.  Some runny nose no sore throat.  No known fever.  Today he noticed that he had lost his sense of smell.  He has had coworkers with [employer].  Denies any nausea vomiting diarrhea.  He feels his back pain is likely related to just laying around and watching football this weekend.  Does not feel like his cardiac pain.     The history is provided by the patient.  Chest Pain Pain location:  R chest Pain quality: dull   Pain radiates to:  Upper back Pain severity:  Moderate Onset quality:  Gradual Duration:  2 days Timing:  Intermittent Progression:  Waxing and waning Chronicity:  New Context: movement   Relieved by:  None tried Worsened by:  Nothing Ineffective treatments:  None tried Associated symptoms: back pain, cough and headache   Associated symptoms: no abdominal pain, no fever, no nausea, no shortness of breath, no syncope, no vomiting and no weakness   Risk factors: coronary artery disease, diabetes mellitus, hypertension and male sex     Past Medical History:  Diagnosis Date  . Abnormal EKG   . Acute coronary syndrome ([place]) 08/15/2017  . CAD in native artery    A. LHC 08/16/17- DES to RCA  . Chest pain 08/15/2017  . Diabetes mellitus type 2 in obese ([place]) 08/15/2017  . Essential hypertension, benign 08/08/2012  . Fracture of lateral malleolus of left ankle 08/07/2012  . High cholesterol   . Hypertensive urgency 08/15/2017  . Sleep apnea   . Tobacco abuse 08/15/2017  . Unstable angina  [facility])     Patient Active Problem List   Diagnosis Date Noted  . CAD in native artery   . Pure hypercholesterolemia   . Tobacco abuse 08/15/2017  . Type 2 diabetes mellitus with hyperglycemia, without long-term current use of insulin ([place]) 08/15/2017  . Normocytic anemia 08/15/2017  . Essential hypertension, benign 08/08/2012    Past Surgical History:  Procedure Laterality Date  . CORONARY STENT INTERVENTION N/A 08/16/2017   Procedure: CORONARY STENT INTERVENTION;  Surgeon: Jettie Booze, MD;  Location: [facility] CV LAB;  Service: Cardiovascular;  Laterality: N/A;  . LEFT HEART CATH AND CORONARY ANGIOGRAPHY N/A 08/16/2017   Procedure: LEFT HEART CATH AND CORONARY ANGIOGRAPHY;  Surgeon: Jettie Booze, MD;  Location: [facility] [facility];  Service: Cardiovascular;  Laterality: N/A;  . ORIF ANKLE FRACTURE Left 08/07/2012   Procedure: OPEN REDUCTION INTERNAL FIXATION (ORIF) LEFT ANKLE FRACTURE;  Surgeon: Mcarthur Rossetti, MD;  Location: [facility];  Service: Orthopedics;  Laterality: Left;        Home Medications    Prior to Admission medications   Medication Sig Start Date End Date Taking? Authorizing Provider  amLODipine (NORVASC) 5 MG tablet Take 1 tablet (5 mg total) by mouth daily. 11/21/18   Sueanne Margarita, MD  aspirin 81 MG chewable tablet Chew 1 tablet (81 mg  total) by mouth daily. 08/18/17   Daune Perch, NP  atorvastatin (LIPITOR) 80 MG tablet Take 1 tablet (80 mg total) by mouth daily at 6 PM. Please make yearly appt with Dr. Radford Pax for November for future refills. 1st attempt 01/31/19   Daune Perch, NP  blood glucose meter kit and supplies Dispense based on patient and insurance preference. Use up to four times daily as directed. (FOR ICD-10 E10.9, E11.9). 06/04/18   Posey Boyer, MD  carvedilol (COREG) 25 MG tablet Take 1 tablet by mouth twice daily 10/08/18   Sueanne Margarita, MD  clopidogrel (PLAVIX) 75 MG tablet Take 1 tablet (75 mg total) by mouth daily.  07/13/18 07/13/19  Sueanne Margarita, MD  JARDIANCE 10 MG TABS tablet Take 1 tablet by mouth once daily 03/19/19   Rutherford Guys, MD  lisinopril (PRINIVIL,ZESTRIL) 40 MG tablet Take 1 tablet by mouth once daily 08/20/18   Daune Perch, NP  metFORMIN (GLUCOPHAGE) 1000 MG tablet TAKE 1 TABLET BY MOUTH TWICE DAILY WITH A MEAL 04/12/19   Sagardia, Ines Bloomer, MD  nitroGLYCERIN (NITROSTAT) 0.4 MG SL tablet Place 1 tablet (0.4 mg total) under the tongue every 5 (five) minutes as needed for chest pain. 08/17/17   Daune Perch, NP    Family History Family History  Problem Relation Age of Onset  . Diabetes Mother   . Hypertension Mother   . Diabetes Brother   . Hypertension Brother     Social History Social History   Tobacco Use  . Smoking status: Current Some Day Smoker    Packs/day: 0.25    Years: 20.00    Pack years: 5.00    Types: Cigarettes    Start date: 08/16/2017  . Smokeless tobacco: Never Used  . Tobacco comment: currently smokes 3 cigarettes a day  Substance Use Topics  . Alcohol use: Yes    Alcohol/week: 6.0 standard drinks    Types: 6 Cans of beer per week    Comment: occ  . Drug use: No     Allergies   Patient has no known allergies.   Review of Systems Review of Systems  Constitutional: Positive for chills. Negative for fever.  HENT: Negative for sore throat.   Eyes: Negative for visual disturbance.  Respiratory: Positive for cough. Negative for shortness of breath.   Cardiovascular: Positive for chest pain. Negative for syncope.  Gastrointestinal: Negative for abdominal pain, nausea and vomiting.  Genitourinary: Negative for dysuria.  Musculoskeletal: Positive for back pain. Negative for neck pain.  Skin: Negative for rash.  Neurological: Positive for headaches. Negative for weakness.     Physical Exam Updated Vital Signs BP (!) 149/98 (BP Location: Right Arm)   Pulse (!) 106   Temp 99.3 F (37.4 C) (Oral)   Resp 16   SpO2 100%   Physical Exam  Vitals signs and nursing note reviewed.  Constitutional:      Appearance: He is well-developed.  HENT:     Head: Normocephalic and atraumatic.  Eyes:     Conjunctiva/sclera: Conjunctivae normal.  Neck:     Musculoskeletal: Neck supple.  Cardiovascular:     Rate and Rhythm: Normal rate and regular rhythm.     Pulses: Normal pulses.     Heart sounds: Normal heart sounds. No murmur.  Pulmonary:     Effort: Pulmonary effort is normal. No respiratory distress.     Breath sounds: Normal breath sounds.  Abdominal:     Palpations: Abdomen is soft.  Tenderness: There is no abdominal tenderness.  Musculoskeletal: Normal range of motion.     Right lower leg: He exhibits no tenderness. No edema.     Left lower leg: He exhibits no tenderness. No edema.  Skin:    General: Skin is warm and dry.     Capillary Refill: Capillary refill takes less than 2 seconds.  Neurological:     General: No focal deficit present.     Mental Status: He is alert.      ED Treatments / Results  Labs (all labs ordered are listed, but only abnormal results are displayed) Labs Reviewed  BASIC METABOLIC PANEL - Abnormal; Notable for the following components:      Result Value   Glucose, Bld 128 (*)    All other components within normal limits  NOVEL CORONAVIRUS, NAA (HOSP ORDER, SEND-OUT TO REF LAB; TAT 18-24 HRS)  CBC  TROPONIN I (HIGH SENSITIVITY)  TROPONIN I (HIGH SENSITIVITY)    EKG EKG Interpretation  Date/Time:  Monday April 22 2019 07:55:06 EST Ventricular Rate:  110 PR Interval:    QRS Duration: 103 QT Interval:  332 QTC Calculation: 450 R Axis:   35 Text Interpretation: Sinus tachycardia Low voltage, extremity leads Borderline ST elevation, anterior leads Confirmed by Aletta Edouard [phone]) on 04/22/2019 7:59:53 AM   Radiology Ct Angio Chest Pe W/cm &/or Wo Cm  Result Date: 04/22/2019 CLINICAL DATA:  Chest pain EXAM: CT ANGIOGRAPHY CHEST WITH CONTRAST TECHNIQUE: Multidetector  CT imaging of the chest was performed using the standard protocol during bolus administration of intravenous contrast. Multiplanar CT image reconstructions and MIPs were obtained to evaluate the vascular anatomy. CONTRAST:  100 mL Omnipaque 350 nonionic COMPARISON:  Chest radiograph April 22, 2019 FINDINGS: Cardiovascular: There is no demonstrable pulmonary embolus. There is no thoracic aortic aneurysm or dissection. Visualized great vessels appear normal. There is aortic atherosclerosis. There is no appreciable pericardial effusion or pericardial thickening. Mediastinum/Nodes: Thyroid appears unremarkable. There are occasional subcentimeter mediastinal and axillary lymph nodes. There is no adenopathy in the thoracic region by size criteria. No esophageal lesions are evident beyond apparent patulous gastroesophageal junction. Lungs/Pleura: No edema or consolidation. Small bulla noted in the anterior segment of the left lower lobe. No pleural effusions are evident. Upper Abdomen: Visualized upper abdominal structures appear unremarkable. Musculoskeletal: No blastic or lytic bone lesions. There are foci of degenerative change in the thoracic spine. No chest wall lesions evident. Review of the MIP images confirms the above findings. IMPRESSION: 1. No demonstrable pulmonary embolus. No thoracic aortic aneurysm or dissection. There is aortic atherosclerosis. 2.  No edema or consolidation. 3.  No evident thoracic adenopathy. Aortic Atherosclerosis (ICD10-I70.0). Electronically Signed   By: Lowella Grip III M.D.   On: 04/22/2019 10:02   Dg Chest Port 1 View  Result Date: 04/22/2019 CLINICAL DATA:  Chest pain. Chills for 2 days. Loss of smell. Exposure to COVID-19. EXAM: PORTABLE CHEST 1 VIEW COMPARISON:  Two-view chest x-ray 04/03/2018. FINDINGS: Heart size is normal. Lung volumes are low. Ill-defined airspace opacity is noted in the medial right lower lobe. There is some scratched at subtle airspace disease  is noted medially in the left lower lobe as well. Edema or effusions are present. IMPRESSION: 1. Ill-defined airspace disease in the medial right lower lobe and medial left lower lobe concerning for pneumonia in this patient exposure to COVID-19 and concerning symptoms. 2. Edema or effusions are present. 3. Low lung volumes. Electronically Signed   By: San Morelle  M.D.   On: 04/22/2019 08:18    Procedures Procedures (including critical care time)  Medications Ordered in ED Medications  aspirin chewable tablet 243 mg (243 mg Oral Given 04/22/19 0820)  iohexol (OMNIPAQUE) 350 MG/ML injection 100 mL (100 mLs Intravenous Contrast Given 04/22/19 0957)     Initial Impression / Assessment and Plan / ED Course  I have reviewed the triage vital signs and the nursing notes.  Pertinent labs & imaging results that were available during my care of the patient were reviewed by me and considered in my medical decision making (see chart for details).  Clinical Course as of Apr 21 1757  Mon Apr 21, 8336  [id number] 53 year old male known coronary disease here with upper back pain radiating to chest along with some Covid-like symptoms of cough headache lack of smell.  Low-grade temp.  993 slightly tachycardic.  Sats 100% on room air.  Differential includes Covid, pneumonia, ACS, musculoskeletal, bronchitis, vascular disease   [MB]  0848 Chest x-ray interpreted by me as no pneumothorax normal mediastinum.  Radiologist commenting about some ill-defined infiltrates.   [MB]  9802 Patient CT angio chest did not show PE nor any obvious consolidation.  Delta troponin negative.  Will discharge and have isolate until Covid testing resulted.   [MB]    Clinical Course User Index [MB] Hayden Rasmussen, MD   Reymundo Poll was evaluated in Emergency Department on 04/22/2019 for the symptoms described in the history of present illness. He was evaluated in the context of the global COVID-19 pandemic, which  necessitated consideration that the patient might be at risk for infection with the SARS-CoV-2 virus that causes COVID-19. Institutional protocols and algorithms that pertain to the evaluation of patients at risk for COVID-19 are in a state of rapid change based on information released by regulatory bodies including the CDC and federal and state organizations. These policies and algorithms were followed during the patient's care in the ED.      Final Clinical Impressions(s) / ED Diagnoses   Final diagnoses:  Upper back pain on right side  Nonspecific chest pain  Person under investigation for COVID-19    ED Discharge Orders    None       Hayden Rasmussen, MD 04/22/19 1759

## 2019-04-24 LAB — NOVEL CORONAVIRUS, NAA (HOSP ORDER, SEND-OUT TO REF LAB; TAT 18-24 HRS): SARS-CoV-2, NAA: DETECTED — AB

## 2019-04-25 ENCOUNTER — Telehealth (INDEPENDENT_AMBULATORY_CARE_PROVIDER_SITE_OTHER): Payer: No Typology Code available for payment source | Admitting: Emergency Medicine

## 2019-04-25 ENCOUNTER — Other Ambulatory Visit: Payer: Self-pay

## 2019-04-25 ENCOUNTER — Encounter: Payer: Self-pay | Admitting: Emergency Medicine

## 2019-04-25 DIAGNOSIS — U071 COVID-19: Secondary | ICD-10-CM | POA: Diagnosis not present

## 2019-04-25 NOTE — Progress Notes (Signed)
Telemedicine Encounter- SOAP NOTE Established Patient  This telephone encounter was conducted with the patient's (or proxy's) verbal consent via audio telecommunications: yes/no: Yes Patient was instructed to have this encounter in a suitably private space; and to only have persons present to whom they give permission to participate. In addition, patient identity was confirmed by use of name plus two identifiers (DOB and address).  I discussed the limitations, risks, security and privacy concerns of performing an evaluation and management service by telephone and the availability of in person appointments. I also discussed with the patient that there may be a patient responsible charge related to this service. The patient expressed understanding and agreed to proceed.  I spent a total of TIME; 0 MIN TO 60 MIN: 15 minutes talking with the patient or their proxy.  No chief complaint on file. Covid infection follow-up Subjective   Clifford Frederick is a 53 y.o. male established patient. Telephone visit today for follow-up of Covid infection.  Tested positive on 04/22/2019.  Minimal symptoms.  Staying at home.  No complications.  Denies difficulty breathing, chest pain, wheezing, high fever or chills, nausea or vomiting.  Able to eat and drink.  Isolating at home.  Tolerating it well.  HPI   Patient Active Problem List   Diagnosis Date Noted  . CAD in native artery   . Pure hypercholesterolemia   . Tobacco abuse 08/15/2017  . Type 2 diabetes mellitus with hyperglycemia, without long-term current use of insulin ([place]) 08/15/2017  . Normocytic anemia 08/15/2017  . Essential hypertension, benign 08/08/2012    Past Medical History:  Diagnosis Date  . Abnormal EKG   . Acute coronary syndrome ([place]) 08/15/2017  . CAD in native artery    A. LHC 08/16/17- DES to RCA  . Chest pain 08/15/2017  . Diabetes mellitus type 2 in obese ([place]) 08/15/2017  . Essential hypertension, benign 08/08/2012  . Fracture  of lateral malleolus of left ankle 08/07/2012  . High cholesterol   . Hypertensive urgency 08/15/2017  . Sleep apnea   . Tobacco abuse 08/15/2017  . Unstable angina [facility])     Current Outpatient Medications  Medication Sig Dispense Refill  . amLODipine (NORVASC) 5 MG tablet Take 1 tablet (5 mg total) by mouth daily. 90 tablet 1  . aspirin 81 MG chewable tablet Chew 1 tablet (81 mg total) by mouth daily. 90 tablet 3  . atorvastatin (LIPITOR) 80 MG tablet Take 1 tablet (80 mg total) by mouth daily at 6 PM. Please make yearly appt with Dr. Radford Pax for November for future refills. 1st attempt 30 tablet 2  . blood glucose meter kit and supplies Dispense based on patient and insurance preference. Use up to four times daily as directed. (FOR ICD-10 E10.9, E11.9). 1 each 0  . carvedilol (COREG) 25 MG tablet Take 1 tablet by mouth twice daily 180 tablet 2  . clopidogrel (PLAVIX) 75 MG tablet Take 1 tablet (75 mg total) by mouth daily. 90 tablet 3  . JARDIANCE 10 MG TABS tablet Take 1 tablet by mouth once daily 90 tablet 0  . lisinopril (PRINIVIL,ZESTRIL) 40 MG tablet Take 1 tablet by mouth once daily 30 tablet 9  . metFORMIN (GLUCOPHAGE) 1000 MG tablet TAKE 1 TABLET BY MOUTH TWICE DAILY WITH A MEAL 180 tablet 0  . nitroGLYCERIN (NITROSTAT) 0.4 MG SL tablet Place 1 tablet (0.4 mg total) under the tongue every 5 (five) minutes as needed for chest pain. 25 tablet 12  No current facility-administered medications for this visit.     No Known Allergies  Social History   Socioeconomic History  . Marital status: Married    Spouse name: Not on file  . Number of children: Not on file  . Years of education: Not on file  . Highest education level: Not on file  Occupational History  . Not on file  Social Needs  . Financial resource strain: Not on file  . Food insecurity    Worry: Not on file    Inability: Not on file  . Transportation needs    Medical: Not on file    Non-medical: Not on file   Tobacco Use  . Smoking status: Current Some Day Smoker    Packs/day: 0.25    Years: 20.00    Pack years: 5.00    Types: Cigarettes    Start date: 08/16/2017  . Smokeless tobacco: Never Used  . Tobacco comment: currently smokes 3 cigarettes a day  Substance and Sexual Activity  . Alcohol use: Yes    Alcohol/week: 6.0 standard drinks    Types: 6 Cans of beer per week    Comment: occ  . Drug use: No  . Sexual activity: Not on file  Lifestyle  . Physical activity    Days per week: Not on file    Minutes per session: Not on file  . Stress: Not on file  Relationships  . Social Herbalist on phone: Not on file    Gets together: Not on file    Attends religious service: Not on file    Active member of club or organization: Not on file    Attends meetings of clubs or organizations: Not on file    Relationship status: Not on file  . Intimate partner violence    Fear of current or ex partner: Not on file    Emotionally abused: Not on file    Physically abused: Not on file    Forced sexual activity: Not on file  Other Topics Concern  . Not on file  Social History Narrative  . Not on file    Review of Systems  Constitutional: Positive for malaise/fatigue. Negative for chills and fever.  HENT: Positive for congestion. Negative for sore throat.        Loss of smell and taste.  Respiratory: Positive for cough. Negative for sputum production, shortness of breath and wheezing.   Cardiovascular: Negative for chest pain and palpitations.  Gastrointestinal: Negative.  Negative for abdominal pain, diarrhea, nausea and vomiting.  Genitourinary: Negative.  Negative for dysuria and hematuria.  Musculoskeletal: Negative.  Negative for back pain, myalgias and neck pain.  Skin: Negative.  Negative for rash.  Neurological: Negative for sensory change and weakness.  Endo/Heme/Allergies: Negative.   All other systems reviewed and are negative.   Objective  Alert and oriented x3 in  no apparent respiratory distress. Vitals as reported by the patient: There were no vitals filed for this visit.  There are no diagnoses linked to this encounter.  Diagnoses and all orders for this visit:  COVID-19 virus infection  Clinically stable.  No complications.  No red flag signs or symptoms.  Covid/ED precautions given. Covid instructions given.  Advised to contact the office if clinical picture changes or worsens in the next several days.   I discussed the assessment and treatment plan with the patient. The patient was provided an opportunity to ask questions and all were answered. The patient agreed  with the plan and demonstrated an understanding of the instructions.   The patient was advised to call back or seek an in-person evaluation if the symptoms worsen or if the condition fails to improve as anticipated.  I provided 15 minutes of non-face-to-face time during this encounter.  Horald Pollen, MD  Primary Care at [facility]

## 2019-04-25 NOTE — Progress Notes (Signed)
Pt has covid, pos for 3 days, want s to know is there anything he should be taking or doing. Live in home with wife. Currently quarantined. Wife is neg. Letter has been prepared and sent to pt via mychart for employer

## 2019-04-29 ENCOUNTER — Ambulatory Visit: Payer: Self-pay

## 2019-04-29 NOTE — Telephone Encounter (Signed)
Patient called stating that he had anquestion about Quarantining after COVID-19 positive test. He was read the criteria for ending isolation.He states he only has loss of taste.    He verbalized understanding.   Reason for Disposition . Health Information question, no triage required and triager able to answer question  Answer Assessment - Initial Assessment Questions 1. REASON FOR CALL or QUESTION: "What is your reason for calling today?" or "How can I best help you?" or "What question do you have that I can help answer?"     When can I end quarantine  Protocols used: [place]

## 2019-05-06 ENCOUNTER — Telehealth: Payer: Self-pay | Admitting: Emergency Medicine

## 2019-05-06 ENCOUNTER — Telehealth (INDEPENDENT_AMBULATORY_CARE_PROVIDER_SITE_OTHER): Payer: No Typology Code available for payment source | Admitting: Emergency Medicine

## 2019-05-06 ENCOUNTER — Other Ambulatory Visit: Payer: Self-pay

## 2019-05-06 ENCOUNTER — Encounter: Payer: Self-pay | Admitting: Emergency Medicine

## 2019-05-06 NOTE — Progress Notes (Signed)
Was pos for covid 04/22/19  Wants clearance to come back to work, job has told him to come back to work today.   Doesn't have any sx currently.

## 2019-05-06 NOTE — Progress Notes (Deleted)
Telemedicine Encounter- SOAP NOTE Established Patient  This telephone encounter was conducted with the patient's (or proxy's) verbal consent via audio telecommunications: {yes/no:20286:::1} Patient was instructed to have this encounter in a suitably private space; and to only have persons present to whom they give permission to participate. In addition, patient identity was confirmed by use of name plus two identifiers (DOB and address).  I discussed the limitations, risks, security and privacy concerns of performing an evaluation and management service by telephone and the availability of in person appointments. I also discussed with the patient that there may be a patient responsible charge related to this service. The patient expressed understanding and agreed to proceed.  I spent a total of {TIME; 0 MIN TO 60 [phone]:::1} talking with the patient or their proxy.  No chief complaint on file.   Subjective   Clifford Frederick is a 53 y.o. established patient. Telephone visit today for  HPI   Patient Active Problem List   Diagnosis Date Noted  . CAD in native artery   . Pure hypercholesterolemia   . Tobacco abuse 08/15/2017  . Type 2 diabetes mellitus with hyperglycemia, without long-term current use of insulin ([place]) 08/15/2017  . Normocytic anemia 08/15/2017  . Essential hypertension, benign 08/08/2012    Past Medical History:  Diagnosis Date  . Abnormal EKG   . Acute coronary syndrome ([place]) 08/15/2017  . CAD in native artery    A. LHC 08/16/17- DES to RCA  . Chest pain 08/15/2017  . Diabetes mellitus type 2 in obese ([place]) 08/15/2017  . Essential hypertension, benign 08/08/2012  . Fracture of lateral malleolus of left ankle 08/07/2012  . High cholesterol   . Hypertensive urgency 08/15/2017  . Sleep apnea   . Tobacco abuse 08/15/2017  . Unstable angina [facility])     Current Outpatient Medications  Medication Sig Dispense Refill  . amLODipine (NORVASC) 5 MG tablet Take 1  tablet (5 mg total) by mouth daily. 90 tablet 1  . aspirin 81 MG chewable tablet Chew 1 tablet (81 mg total) by mouth daily. 90 tablet 3  . atorvastatin (LIPITOR) 80 MG tablet Take 1 tablet (80 mg total) by mouth daily at 6 PM. Please make yearly appt with Dr. Radford Pax for November for future refills. 1st attempt 30 tablet 2  . blood glucose meter kit and supplies Dispense based on patient and insurance preference. Use up to four times daily as directed. (FOR ICD-10 E10.9, E11.9). 1 each 0  . carvedilol (COREG) 25 MG tablet Take 1 tablet by mouth twice daily 180 tablet 2  . clopidogrel (PLAVIX) 75 MG tablet Take 1 tablet (75 mg total) by mouth daily. 90 tablet 3  . JARDIANCE 10 MG TABS tablet Take 1 tablet by mouth once daily 90 tablet 0  . lisinopril (PRINIVIL,ZESTRIL) 40 MG tablet Take 1 tablet by mouth once daily 30 tablet 9  . metFORMIN (GLUCOPHAGE) 1000 MG tablet TAKE 1 TABLET BY MOUTH TWICE DAILY WITH A MEAL 180 tablet 0  . nitroGLYCERIN (NITROSTAT) 0.4 MG SL tablet Place 1 tablet (0.4 mg total) under the tongue every 5 (five) minutes as needed for chest pain. 25 tablet 12   No current facility-administered medications for this visit.     No Known Allergies  Social History   Socioeconomic History  . Marital status: Married    Spouse name: Not on file  . Number of children: Not on file  . Years of education: Not on file  .  Highest education level: Not on file  Occupational History  . Not on file  Social Needs  . Financial resource strain: Not on file  . Food insecurity    Worry: Not on file    Inability: Not on file  . Transportation needs    Medical: Not on file    Non-medical: Not on file  Tobacco Use  . Smoking status: Current Some Day Smoker    Packs/day: 0.25    Years: 20.00    Pack years: 5.00    Types: Cigarettes    Start date: 08/16/2017  . Smokeless tobacco: Never Used  . Tobacco comment: currently smokes 3 cigarettes a day  Substance and Sexual Activity  .  Alcohol use: Yes    Alcohol/week: 6.0 standard drinks    Types: 6 Cans of beer per week    Comment: occ  . Drug use: No  . Sexual activity: Not on file  Lifestyle  . Physical activity    Days per week: Not on file    Minutes per session: Not on file  . Stress: Not on file  Relationships  . Social Herbalist on phone: Not on file    Gets together: Not on file    Attends religious service: Not on file    Active member of club or organization: Not on file    Attends meetings of clubs or organizations: Not on file    Relationship status: Not on file  . Intimate partner violence    Fear of current or ex partner: Not on file    Emotionally abused: Not on file    Physically abused: Not on file    Forced sexual activity: Not on file  Other Topics Concern  . Not on file  Social History Narrative  . Not on file    ROS  Objective   Vitals as reported by the patient: Today's Vitals   05/06/19 1453  Weight: 230 lb (104.3 kg)  Height: _0  (1.803 m)    There are no diagnoses linked to this encounter.   I discussed the assessment and treatment plan with the patient. The patient was provided an opportunity to ask questions and all were answered. The patient agreed with the plan and demonstrated an understanding of the instructions.   The patient was advised to call back or seek an in-person evaluation if the symptoms worsen or if the condition fails to improve as anticipated.  I provided *** minutes of non-face-to-face time during this encounter.  Horald Pollen, MD  Primary Care at [facility]

## 2019-05-06 NOTE — Patient Instructions (Signed)
° ° ° °  If you have lab work done today you will be contacted with your lab results within the next 2 weeks.  If you have not heard from us then please contact us. The fastest way to get your results is to register for My Chart. ° ° °IF you received an x-ray today, you will receive an invoice from [facility]. Please contact [facility] at [phone] with questions or concerns regarding your invoice.  ° °IF you received labwork today, you will receive an invoice from [facility]. Please contact [facility] at [phone] with questions or concerns regarding your invoice.  ° °Our billing staff will not be able to assist you with questions regarding bills from these companies. ° °You will be contacted with the lab results as soon as they are available. The fastest way to get your results is to activate your My Chart account. Instructions are located on the last page of this paperwork. If you have not heard from us regarding the results in 2 weeks, please contact this office. °  ° ° ° °

## 2019-05-07 ENCOUNTER — Encounter: Payer: Self-pay | Admitting: Emergency Medicine

## 2019-05-07 NOTE — Telephone Encounter (Signed)
Copied from [place] [id number]. Topic: Quick Communication - See Telephone Encounter >> May 06, 2019  5:20 PM Blase Mess A wrote: CRM for notification. See Telephone encounter for: 05/06/19.  Patient is calling regarding advice with his appt. Patient has a 4:40p on 05/06/2019 with Dr. Mitchel Honour. The patient has not heard from the doctor. Please advise. Thank you

## 2019-05-08 NOTE — Telephone Encounter (Signed)
Unable to connect

## 2019-05-10 ENCOUNTER — Telehealth: Payer: Self-pay | Admitting: *Deleted

## 2019-05-10 NOTE — Telephone Encounter (Signed)
Rebecca Garcia Gill from [facility] called for patients contact information. 

## 2019-05-20 ENCOUNTER — Other Ambulatory Visit: Payer: Self-pay | Admitting: Cardiology

## 2019-06-12 ENCOUNTER — Other Ambulatory Visit: Payer: Self-pay | Admitting: Cardiology

## 2019-06-13 ENCOUNTER — Other Ambulatory Visit: Payer: Self-pay

## 2019-06-13 MED ORDER — LISINOPRIL 40 MG PO TABS: 40.0000 mg | ORAL_TABLET | Freq: Every day | ORAL | 0 refills | Status: DC

## 2019-06-24 ENCOUNTER — Other Ambulatory Visit: Payer: Self-pay | Admitting: Cardiology

## 2019-06-24 ENCOUNTER — Other Ambulatory Visit: Payer: Self-pay | Admitting: Family Medicine

## 2019-07-16 ENCOUNTER — Other Ambulatory Visit: Payer: Self-pay | Admitting: Emergency Medicine

## 2019-07-16 ENCOUNTER — Other Ambulatory Visit: Payer: Self-pay | Admitting: Cardiology

## 2019-07-16 NOTE — Telephone Encounter (Signed)
Requested medication (s) are due for refill today: yes  Requested medication (s) are on the active medication list: yes  Last refill:  04/12/2019  Future visit scheduled: yes  Notes to clinic:  Endocrinology: Diabetes - Biguanides failed  Requested Prescriptions  Pending Prescriptions Disp Refills   metFORMIN (GLUCOPHAGE) 1000 MG tablet [Pharmacy Med Name: metFORMIN HCl 1000 MG Oral Tablet] 180 tablet 0    Sig: TAKE 1 TABLET BY MOUTH TWICE DAILY WITH A MEAL      Endocrinology:  Diabetes - Biguanides Failed - 07/16/2019  6:45 PM      Failed - HBA1C is between 0 and 7.9 and within 180 days    Hgb A1c MFr Bld  Date Value Ref Range Status  01/15/2019 6.6 (H) 4.8 - 5.6 % Final    Comment:             Prediabetes: 5.7 - 6.4          Diabetes: >6.4          Glycemic control for adults with diabetes: <7.0           Passed - Cr in normal range and within 360 days    Creatinine, Ser  Date Value Ref Range Status  04/22/2019 1.17 0.61 - 1.24 mg/dL Final          Passed - eGFR in normal range and within 360 days    GFR calc Af Amer  Date Value Ref Range Status  04/22/2019 >60 >60 mL/min Final   GFR calc non Af Amer  Date Value Ref Range Status  04/22/2019 >60 >60 mL/min Final          Passed - Valid encounter within last 6 months    Recent Outpatient Visits           2 months ago    Primary Care at [facility], Ines Bloomer, MD   2 months ago COVID-19 virus infection   Primary Care at [place], [place], [place]   6 months ago Hypertension associated with type 2 diabetes mellitus [facility])   Primary Care at [facility], Ines Bloomer, MD   8 months ago Dyspnea, unspecified type   Primary Care at [facility], [place], MD   12 months ago Type 2 diabetes mellitus with hyperglycemia, without long-term current use of insulin [facility])   Primary Care at [facility], Ines Bloomer, MD       Future Appointments             Tomorrow [place], Ines Bloomer, MD  Primary Care at [place], [facility]

## 2019-07-17 ENCOUNTER — Ambulatory Visit: Payer: No Typology Code available for payment source | Admitting: Emergency Medicine

## 2019-07-17 ENCOUNTER — Encounter: Payer: Self-pay | Admitting: Emergency Medicine

## 2019-07-17 ENCOUNTER — Other Ambulatory Visit: Payer: Self-pay

## 2019-07-17 VITALS — BP 127/75 | HR 98 | Temp 95.0°F | Resp 16 | Ht 70.0 in | Wt 245.0 lb

## 2019-07-17 DIAGNOSIS — I152 Hypertension secondary to endocrine disorders: Secondary | ICD-10-CM | POA: Insufficient documentation

## 2019-07-17 DIAGNOSIS — I10 Essential (primary) hypertension: Secondary | ICD-10-CM

## 2019-07-17 DIAGNOSIS — I2510 Atherosclerotic heart disease of native coronary artery without angina pectoris: Secondary | ICD-10-CM

## 2019-07-17 DIAGNOSIS — E1169 Type 2 diabetes mellitus with other specified complication: Secondary | ICD-10-CM

## 2019-07-17 DIAGNOSIS — G4733 Obstructive sleep apnea (adult) (pediatric): Secondary | ICD-10-CM

## 2019-07-17 DIAGNOSIS — E1159 Type 2 diabetes mellitus with other circulatory complications: Secondary | ICD-10-CM

## 2019-07-17 DIAGNOSIS — Z8616 Personal history of COVID-19: Secondary | ICD-10-CM | POA: Diagnosis not present

## 2019-07-17 DIAGNOSIS — E785 Hyperlipidemia, unspecified: Secondary | ICD-10-CM

## 2019-07-17 LAB — POCT GLYCOSYLATED HEMOGLOBIN (HGB A1C): Hemoglobin A1C: 7.1 % — AB (ref 4.0–5.6)

## 2019-07-17 LAB — GLUCOSE, POCT (MANUAL RESULT ENTRY): POC Glucose: 110 mg/dl — AB (ref 70–99)

## 2019-07-17 MED ORDER — METFORMIN HCL 1000 MG PO TABS: 1000.0000 mg | ORAL_TABLET | Freq: Two times a day (BID) | ORAL | 3 refills | Status: DC

## 2019-07-17 MED ORDER — TRULICITY 0.75 MG/0.5ML ~~LOC~~ SOAJ: 0.7500 mg | SUBCUTANEOUS | 5 refills | Status: AC

## 2019-07-17 NOTE — Progress Notes (Signed)
Clifford Frederick 54 y.o.   Chief Complaint  Patient presents with  . Diabetes    6 month follow up  . Hypertension    medication refill - Metformin    HISTORY OF PRESENT ILLNESS: This is a 54 y.o. male with history of diabetes and hypertension here for follow-up and medication refill. 1.  Diabetes: On Metformin 1000 mg twice a day and Jardiance 10 mg once a day.  Does not check sugars at home.  No complaints. Lab Results  Component Value Date   HGBA1C 6.6 (H) 01/15/2019  States he is gained some weight. Wt Readings from Last 3 Encounters:  07/17/19 245 lb (111.1 kg)  05/06/19 230 lb (104.3 kg)  04/22/19 230 lb (104.3 kg)     2.  Hypertension: On lisinopril 40 mg a day, amlodipine 5 mg a day BP Readings from Last 3 Encounters:  07/17/19 127/75  04/22/19 (!) 153/94  01/15/19 129/79   3.  Dyslipidemia: On Lipitor 80 mg daily Lab Results  Component Value Date   CHOL 120 01/15/2019   HDL 38 (L) 01/15/2019   LDLCALC 55 01/15/2019   TRIG 137 01/15/2019   CHOLHDL 3.2 01/15/2019    4.  Coronary artery disease: On carvedilol 25 mg twice daily, nitroglycerin sublingual as needed, and 1 baby aspirin daily.  No complaints. 5.  Status post Covid infection last November.  Mild case.  Asymptomatic.  Doing well. Has no complaints or medical concerns today.  Doing well.   HPI   Prior to Admission medications   Medication Sig Start Date End Date Taking? Authorizing Provider  amLODipine (NORVASC) 5 MG tablet Take 1 tablet (5 mg total) by mouth daily. Please call to schedule appt for future refills. 05/21/19  Yes Sueanne Margarita, MD  aspirin 81 MG chewable tablet Chew 1 tablet (81 mg total) by mouth daily. 08/18/17  Yes Daune Perch, NP  atorvastatin (LIPITOR) 80 MG tablet TAKE 1 TABLET BY MOUTH ONCE DAILY 6 IN THE EVENING . APPOINTMENT REQUIRED FOR FUTURE REFILLS 06/25/19  Yes Sueanne Margarita, MD  carvedilol (COREG) 25 MG tablet Take 1 tablet by mouth twice daily 10/08/18  Yes Turner,  Eber Hong, MD  JARDIANCE 10 MG TABS tablet Take 1 tablet by mouth once daily 06/24/19  Yes Rutherford Guys, MD  lisinopril (ZESTRIL) 40 MG tablet Take 1 tablet (40 mg total) by mouth daily. Please make appt for future refills. 06/13/19  Yes Daune Perch, NP  metFORMIN (GLUCOPHAGE) 1000 MG tablet TAKE 1 TABLET BY MOUTH TWICE DAILY WITH A MEAL 04/12/19  Yes Lennix Kneisel, Ines Bloomer, MD  nitroGLYCERIN (NITROSTAT) 0.4 MG SL tablet Place 1 tablet (0.4 mg total) under the tongue every 5 (five) minutes as needed for chest pain. 08/17/17  Yes Daune Perch, NP  blood glucose meter kit and supplies Dispense based on patient and insurance preference. Use up to four times daily as directed. (FOR ICD-10 E10.9, E11.9). 06/04/18   Posey Boyer, MD    No Known Allergies  Patient Active Problem List   Diagnosis Date Noted  . CAD in native artery   . Pure hypercholesterolemia   . Tobacco abuse 08/15/2017  . Type 2 diabetes mellitus with hyperglycemia, without long-term current use of insulin ([place]) 08/15/2017  . Normocytic anemia 08/15/2017  . Essential hypertension, benign 08/08/2012    Past Medical History:  Diagnosis Date  . Abnormal EKG   . Acute coronary syndrome ([place]) 08/15/2017  . CAD in native artery  A. LHC 08/16/17- DES to RCA  . Chest pain 08/15/2017  . Diabetes mellitus type 2 in obese ([place]) 08/15/2017  . Essential hypertension, benign 08/08/2012  . Fracture of lateral malleolus of left ankle 08/07/2012  . High cholesterol   . Hypertensive urgency 08/15/2017  . Sleep apnea   . Tobacco abuse 08/15/2017  . Unstable angina [facility])     Past Surgical History:  Procedure Laterality Date  . CORONARY STENT INTERVENTION N/A 08/16/2017   Procedure: CORONARY STENT INTERVENTION;  Surgeon: Jettie Booze, MD;  Location: [facility];  Service: Cardiovascular;  Laterality: N/A;  . LEFT HEART CATH AND CORONARY ANGIOGRAPHY N/A 08/16/2017   Procedure: LEFT HEART CATH AND CORONARY ANGIOGRAPHY;   Surgeon: Jettie Booze, MD;  Location: [facility];  Service: Cardiovascular;  Laterality: N/A;  . ORIF ANKLE FRACTURE Left 08/07/2012   Procedure: OPEN REDUCTION INTERNAL FIXATION (ORIF) LEFT ANKLE FRACTURE;  Surgeon: Mcarthur Rossetti, MD;  Location: [facility];  Service: Orthopedics;  Laterality: Left;    Social History   Socioeconomic History  . Marital status: Married    Spouse name: Not on file  . Number of children: Not on file  . Years of education: Not on file  . Highest education level: Not on file  Occupational History  . Not on file  Tobacco Use  . Smoking status: Current Some Day Smoker    Packs/day: 0.25    Years: 20.00    Pack years: 5.00    Types: Cigarettes    Start date: 08/16/2017  . Smokeless tobacco: Never Used  . Tobacco comment: currently smokes 3 cigarettes a day  Substance and Sexual Activity  . Alcohol use: Yes    Alcohol/week: 6.0 standard drinks    Types: 6 Cans of beer per week    Comment: occ  . Drug use: No  . Sexual activity: Not on file  Other Topics Concern  . Not on file  Social History Narrative  . Not on file   Social Determinants of Health   Financial Resource Strain:   . Difficulty of Paying Living Expenses: Not on file  Food Insecurity:   . Worried About Charity fundraiser in the Last Year: Not on file  . Ran Out of Food in the Last Year: Not on file  Transportation Needs:   . Lack of Transportation (Medical): Not on file  . Lack of Transportation (Non-Medical): Not on file  Physical Activity:   . Days of Exercise per Week: Not on file  . Minutes of Exercise per Session: Not on file  Stress:   . Feeling of Stress : Not on file  Social Connections:   . Frequency of Communication with Friends and Family: Not on file  . Frequency of Social Gatherings with Friends and Family: Not on file  . Attends Religious Services: Not on file  . Active Member of Clubs or Organizations: Not on file  . Attends Theatre manager Meetings: Not on file  . Marital Status: Not on file  Intimate Partner Violence:   . Fear of Current or Ex-Partner: Not on file  . Emotionally Abused: Not on file  . Physically Abused: Not on file  . Sexually Abused: Not on file    Family History  Problem Relation Age of Onset  . Diabetes Mother   . Hypertension Mother   . Diabetes Brother   . Hypertension Brother      Review of Systems  Constitutional:  Negative.  Negative for chills and fever.  HENT: Negative.  Negative for congestion and sore throat.   Respiratory: Negative.  Negative for cough and shortness of breath.   Cardiovascular: Negative.  Negative for chest pain and palpitations.  Gastrointestinal: Negative.  Negative for abdominal pain, diarrhea, nausea and vomiting.  Genitourinary: Negative.  Negative for dysuria and hematuria.  Musculoskeletal: Negative for myalgias.  Skin: Negative.  Negative for rash.  Neurological: Negative.  Negative for dizziness and headaches.  Endo/Heme/Allergies: Negative.   All other systems reviewed and are negative.  Today's Vitals   07/17/19 0829  BP: 127/75  Pulse: 98  Resp: 16  Temp: (!) 95 F (35 C)  TempSrc: Temporal  SpO2: 97%  Weight: 245 lb (111.1 kg)  Height: '5\' 10"'$  (1.778 m)   Body mass index is 35.15 kg/m.   Physical Exam Vitals reviewed.  Constitutional:      Appearance: Normal appearance.  HENT:     Head: Normocephalic.  Eyes:     Extraocular Movements: Extraocular movements intact.     Conjunctiva/sclera: Conjunctivae normal.     Pupils: Pupils are equal, round, and reactive to light.  Cardiovascular:     Rate and Rhythm: Normal rate and regular rhythm.     Pulses: Normal pulses.     Heart sounds: Normal heart sounds.  Pulmonary:     Effort: Pulmonary effort is normal.     Breath sounds: Normal breath sounds.  Abdominal:     General: There is no distension.     Palpations: Abdomen is soft.     Tenderness: There is no abdominal  tenderness.  Musculoskeletal:        General: Normal range of motion.     Cervical back: Normal range of motion and neck supple.  Skin:    General: Skin is warm and dry.     Capillary Refill: Capillary refill takes less than 2 seconds.  Neurological:     General: No focal deficit present.     Mental Status: He is alert and oriented to person, place, and time.  Psychiatric:        Mood and Affect: Mood normal.        Behavior: Behavior normal.    Results for orders placed or performed in visit on 07/17/19 (from the past 24 hour(s))  POCT glucose (manual entry)     Status: Abnormal   Collection Time: 07/17/19  8:42 AM  Result Value Ref Range   POC Glucose 110 (A) 70 - 99 mg/dl  POCT glycosylated hemoglobin (Hb A1C)     Status: Abnormal   Collection Time: 07/17/19  8:50 AM  Result Value Ref Range   Hemoglobin A1C 7.1 (A) 4.0 - 5.6 %   HbA1c POC (<> result, manual entry)     HbA1c, POC (prediabetic range)     HbA1c, POC (controlled diabetic range)     A total of 45 minutes was spent with the patient, greater than 50% of which was in counseling/coordination of care regarding diabetes and management including diet, nutrition, and medications.  Instructed on no medication and administration of Trulicity pen.  Discussed chronic medical problems including hypertension and dyslipidemia and cardiovascular risks associated with these.  Also reviewed most recent office visit notes, most recent blood work results, prognosis and need for follow-up.  Covid related topics also discussed, patient recovering well from Covid infection last November.    ASSESSMENT & PLAN: Hypertension associated with type 2 diabetes mellitus ([place]) Well-controlled hypertension.  Continue  present medications.  No changes. Uncontrolled diabetes with hemoglobin A1c 7.1 higher than before.  Continue Metformin and Jardiance.  Will add Trulicity 7.42 mg weekly.  Instructed on how to use Trulicity pen.  Diet and nutrition  discussed with patient.  Continue statin therapy. Follow-up in 3 months.  Lucas was seen today for diabetes and hypertension.  Diagnoses and all orders for this visit:  Hypertension associated with type 2 diabetes mellitus ([place]) -     POCT glucose (manual entry) -     POCT glycosylated hemoglobin (Hb A1C) -     Comprehensive metabolic panel -     metFORMIN (GLUCOPHAGE) 1000 MG tablet; Take 1 tablet (1,000 mg total) by mouth 2 (two) times daily with a meal. -     Dulaglutide (TRULICITY) 5.95 GL/8.7FI SOPN; Inject 0.75 mg into the skin once a week.  Dyslipidemia associated with type 2 diabetes mellitus ([facility]) -     Lipid panel  History of COVID-19  OSA (obstructive sleep apnea)  CAD in native artery    Patient Instructions       If you have lab work done today you will be contacted with your lab results within the next 2 weeks.  If you have not heard from [place] then please contact us. The fastest way to get your results is to register for My Chart.   IF you received an x-ray today, you will receive an invoice from [facility]. Please contact [facility] at [phone] with questions or concerns regarding your invoice.   IF you received labwork today, you will receive an invoice from [facility]. Please contact [facility] at [phone] with questions or concerns regarding your invoice.   Our billing staff will not be able to assist you with questions regarding bills from these companies.  You will be contacted with the lab results as soon as they are available. The fastest way to get your results is to activate your My Chart account. Instructions are located on the last page of this paperwork. If you have not heard from [place] regarding the results in 2 weeks, please contact this office.     Diabetes Mellitus and Nutrition, Adult When you have diabetes (diabetes mellitus), it is very important to have healthy eating habits because your blood sugar (glucose) levels  are greatly affected by what you eat and drink. Eating healthy foods in the appropriate amounts, at about the same times every day, can help you:  Control your blood glucose.  Lower your risk of heart disease.  Improve your blood pressure.  Reach or maintain a healthy weight. Every person with diabetes is different, and each person has different needs for a meal plan. Your health care provider may recommend that you work with a diet and nutrition specialist (dietitian) to make a meal plan that is best for you. Your meal plan may vary depending on factors such as:  The calories you need.  The medicines you take.  Your weight.  Your blood glucose, blood pressure, and cholesterol levels.  Your activity level.  Other health conditions you have, such as heart or kidney disease. How do carbohydrates affect me? Carbohydrates, also called carbs, affect your blood glucose level more than any other type of food. Eating carbs naturally raises the amount of glucose in your blood. Carb counting is a method for keeping track of how many carbs you eat. Counting carbs is important to keep your blood glucose at a healthy level, especially if you use insulin or take  certain oral diabetes medicines. It is important to know how many carbs you can safely have in each meal. This is different for every person. Your dietitian can help you calculate how many carbs you should have at each meal and for each snack. Foods that contain carbs include:  Bread, cereal, rice, pasta, and crackers.  Potatoes and corn.  Peas, beans, and lentils.  Milk and yogurt.  Fruit and juice.  Desserts, such as cakes, cookies, ice cream, and candy. How does alcohol affect me? Alcohol can cause a sudden decrease in blood glucose (hypoglycemia), especially if you use insulin or take certain oral diabetes medicines. Hypoglycemia can be a life-threatening condition. Symptoms of hypoglycemia (sleepiness, dizziness, and confusion)  are similar to symptoms of having too much alcohol. If your health care provider says that alcohol is safe for you, follow these guidelines:  Limit alcohol intake to no more than 1 drink per day for nonpregnant women and 2 drinks per day for men. One drink equals 12 oz of beer, 5 oz of wine, or 1 oz of hard liquor.  Do not drink on an empty stomach.  Keep yourself hydrated with water, diet soda, or unsweetened iced tea.  Keep in mind that regular soda, juice, and other mixers may contain a lot of sugar and must be counted as carbs. What are tips for following this plan?  Reading food labels  Start by checking the serving size on the "Nutrition Facts" label of packaged foods and drinks. The amount of calories, carbs, fats, and other nutrients listed on the label is based on one serving of the item. Many items contain more than one serving per package.  Check the total grams (g) of carbs in one serving. You can calculate the number of servings of carbs in one serving by dividing the total carbs by 15. For example, if a food has 30 g of total carbs, it would be equal to 2 servings of carbs.  Check the number of grams (g) of saturated and trans fats in one serving. Choose foods that have low or no amount of these fats.  Check the number of milligrams (mg) of salt (sodium) in one serving. Most people should limit total sodium intake to less than 2,300 mg per day.  Always check the nutrition information of foods labeled as "low-fat" or "nonfat". These foods may be higher in added sugar or refined carbs and should be avoided.  Talk to your dietitian to identify your daily goals for nutrients listed on the label. Shopping  Avoid buying canned, premade, or processed foods. These foods tend to be high in fat, sodium, and added sugar.  Shop around the outside edge of the grocery store. This includes fresh fruits and vegetables, bulk grains, fresh meats, and fresh dairy. Cooking  Use low-heat  cooking methods, such as baking, instead of high-heat cooking methods like deep frying.  Cook using healthy oils, such as olive, canola, or sunflower oil.  Avoid cooking with butter, cream, or high-fat meats. Meal planning  Eat meals and snacks regularly, preferably at the same times every day. Avoid going long periods of time without eating.  Eat foods high in fiber, such as fresh fruits, vegetables, beans, and whole grains. Talk to your dietitian about how many servings of carbs you can eat at each meal.  Eat 4-6 ounces (oz) of lean protein each day, such as lean meat, chicken, fish, eggs, or tofu. One oz of lean protein is equal to: ? 1  oz of meat, chicken, or fish. ? 1 egg. ?  cup of tofu.  Eat some foods each day that contain healthy fats, such as avocado, nuts, seeds, and fish. Lifestyle  Check your blood glucose regularly.  Exercise regularly as told by your health care provider. This may include: ? 150 minutes of moderate-intensity or vigorous-intensity exercise each week. This could be brisk walking, biking, or water aerobics. ? Stretching and doing strength exercises, such as yoga or weightlifting, at least 2 times a week.  Take medicines as told by your health care provider.  Do not use any products that contain nicotine or tobacco, such as cigarettes and e-cigarettes. If you need help quitting, ask your health care provider.  Work with a Social worker or diabetes educator to identify strategies to manage stress and any emotional and social challenges. Questions to ask a health care provider  Do I need to meet with a diabetes educator?  Do I need to meet with a dietitian?  What number can I call if I have questions?  When are the best times to check my blood glucose? Where to find more information:  American Diabetes Association: diabetes.org  Academy of Nutrition and Dietetics: www.eatright.[employer] of Diabetes and Digestive and Kidney Diseases  (NIH): [employer] Summary  A healthy meal plan will help you control your blood glucose and maintain a healthy lifestyle.  Working with a diet and nutrition specialist (dietitian) can help you make a meal plan that is best for you.  Keep in mind that carbohydrates (carbs) and alcohol have immediate effects on your blood glucose levels. It is important to count carbs and to use alcohol carefully. This information is not intended to replace advice given to you by your health care provider. Make sure you discuss any questions you have with your health care provider. Document Revised: 05/05/2017 Document Reviewed: 06/27/2016 Elsevier Patient Education  2020 Elsevier Inc.      Agustina Caroli, MD [facility]

## 2019-07-17 NOTE — Assessment & Plan Note (Signed)
Well-controlled hypertension.  Continue present medications.  No changes. Uncontrolled diabetes with hemoglobin A1c 7.1 higher than before.  Continue Metformin and Jardiance.  Will add Trulicity 0.75 mg weekly.  Instructed on how to use Trulicity pen.  Diet and nutrition discussed with patient.  Continue statin therapy. Follow-up in 3 months.

## 2019-07-17 NOTE — Patient Instructions (Addendum)
   If you have lab work done today you will be contacted with your lab results within the next 2 weeks.  If you have not heard from us then please contact us. The fastest way to get your results is to register for My Chart.   IF you received an x-ray today, you will receive an invoice from [facility]. Please contact [facility] at [phone] with questions or concerns regarding your invoice.   IF you received labwork today, you will receive an invoice from [facility]. Please contact [facility] at [phone] with questions or concerns regarding your invoice.   Our billing staff will not be able to assist you with questions regarding bills from these companies.  You will be contacted with the lab results as soon as they are available. The fastest way to get your results is to activate your My Chart account. Instructions are located on the last page of this paperwork. If you have not heard from us regarding the results in 2 weeks, please contact this office.      Diabetes Mellitus and Nutrition, Adult When you have diabetes (diabetes mellitus), it is very important to have healthy eating habits because your blood sugar (glucose) levels are greatly affected by what you eat and drink. Eating healthy foods in the appropriate amounts, at about the same times every day, can help you:  Control your blood glucose.  Lower your risk of heart disease.  Improve your blood pressure.  Reach or maintain a healthy weight. Every person with diabetes is different, and each person has different needs for a meal plan. Your health care provider may recommend that you work with a diet and nutrition specialist (dietitian) to make a meal plan that is best for you. Your meal plan may vary depending on factors such as:  The calories you need.  The medicines you take.  Your weight.  Your blood glucose, blood pressure, and cholesterol levels.  Your activity level.  Other health  conditions you have, such as heart or kidney disease. How do carbohydrates affect me? Carbohydrates, also called carbs, affect your blood glucose level more than any other type of food. Eating carbs naturally raises the amount of glucose in your blood. Carb counting is a method for keeping track of how many carbs you eat. Counting carbs is important to keep your blood glucose at a healthy level, especially if you use insulin or take certain oral diabetes medicines. It is important to know how many carbs you can safely have in each meal. This is different for every person. Your dietitian can help you calculate how many carbs you should have at each meal and for each snack. Foods that contain carbs include:  Bread, cereal, rice, pasta, and crackers.  Potatoes and corn.  Peas, beans, and lentils.  Milk and yogurt.  Fruit and juice.  Desserts, such as cakes, cookies, ice cream, and candy. How does alcohol affect me? Alcohol can cause a sudden decrease in blood glucose (hypoglycemia), especially if you use insulin or take certain oral diabetes medicines. Hypoglycemia can be a life-threatening condition. Symptoms of hypoglycemia (sleepiness, dizziness, and confusion) are similar to symptoms of having too much alcohol. If your health care provider says that alcohol is safe for you, follow these guidelines:  Limit alcohol intake to no more than 1 drink per day for nonpregnant women and 2 drinks per day for men. One drink equals 12 oz of beer, 5 oz of wine, or 1 oz of hard   liquor.  Do not drink on an empty stomach.  Keep yourself hydrated with water, diet soda, or unsweetened iced tea.  Keep in mind that regular soda, juice, and other mixers may contain a lot of sugar and must be counted as carbs. What are tips for following this plan?  Reading food labels  Start by checking the serving size on the "Nutrition Facts" label of packaged foods and drinks. The amount of calories, carbs, fats, and  other nutrients listed on the label is based on one serving of the item. Many items contain more than one serving per package.  Check the total grams (g) of carbs in one serving. You can calculate the number of servings of carbs in one serving by dividing the total carbs by 15. For example, if a food has 30 g of total carbs, it would be equal to 2 servings of carbs.  Check the number of grams (g) of saturated and trans fats in one serving. Choose foods that have low or no amount of these fats.  Check the number of milligrams (mg) of salt (sodium) in one serving. Most people should limit total sodium intake to less than 2,300 mg per day.  Always check the nutrition information of foods labeled as "low-fat" or "nonfat". These foods may be higher in added sugar or refined carbs and should be avoided.  Talk to your dietitian to identify your daily goals for nutrients listed on the label. Shopping  Avoid buying canned, premade, or processed foods. These foods tend to be high in fat, sodium, and added sugar.  Shop around the outside edge of the grocery store. This includes fresh fruits and vegetables, bulk grains, fresh meats, and fresh dairy. Cooking  Use low-heat cooking methods, such as baking, instead of high-heat cooking methods like deep frying.  Cook using healthy oils, such as olive, canola, or sunflower oil.  Avoid cooking with butter, cream, or high-fat meats. Meal planning  Eat meals and snacks regularly, preferably at the same times every day. Avoid going long periods of time without eating.  Eat foods high in fiber, such as fresh fruits, vegetables, beans, and whole grains. Talk to your dietitian about how many servings of carbs you can eat at each meal.  Eat 4-6 ounces (oz) of lean protein each day, such as lean meat, chicken, fish, eggs, or tofu. One oz of lean protein is equal to: ? 1 oz of meat, chicken, or fish. ? 1 egg. ?  cup of tofu.  Eat some foods each day that  contain healthy fats, such as avocado, nuts, seeds, and fish. Lifestyle  Check your blood glucose regularly.  Exercise regularly as told by your health care provider. This may include: ? 150 minutes of moderate-intensity or vigorous-intensity exercise each week. This could be brisk walking, biking, or water aerobics. ? Stretching and doing strength exercises, such as yoga or weightlifting, at least 2 times a week.  Take medicines as told by your health care provider.  Do not use any products that contain nicotine or tobacco, such as cigarettes and e-cigarettes. If you need help quitting, ask your health care provider.  Work with a counselor or diabetes educator to identify strategies to manage stress and any emotional and social challenges. Questions to ask a health care provider  Do I need to meet with a diabetes educator?  Do I need to meet with a dietitian?  What number can I call if I have questions?  When are the best   times to check my blood glucose? Where to find more information:  American Diabetes Association: diabetes.org  Academy of Nutrition and Dietetics: www.eatright.org  National Institute of Diabetes and Digestive and Kidney Diseases (NIH): www.niddk.nih.gov Summary  A healthy meal plan will help you control your blood glucose and maintain a healthy lifestyle.  Working with a diet and nutrition specialist (dietitian) can help you make a meal plan that is best for you.  Keep in mind that carbohydrates (carbs) and alcohol have immediate effects on your blood glucose levels. It is important to count carbs and to use alcohol carefully. This information is not intended to replace advice given to you by your health care provider. Make sure you discuss any questions you have with your health care provider. Document Revised: 05/05/2017 Document Reviewed: 06/27/2016 [employer]vier Patient Education  2020 [employer]  

## 2019-07-18 ENCOUNTER — Other Ambulatory Visit: Payer: Self-pay | Admitting: Cardiology

## 2019-07-18 LAB — COMPREHENSIVE METABOLIC PANEL
ALT: 29 IU/L (ref 0–44)
AST: 27 IU/L (ref 0–40)
Albumin/Globulin Ratio: 2 (ref 1.2–2.2)
Albumin: 4.7 g/dL (ref 3.8–4.9)
Alkaline Phosphatase: 56 IU/L (ref 39–117)
BUN/Creatinine Ratio: 13 (ref 9–20)
BUN: 14 mg/dL (ref 6–24)
Bilirubin Total: 0.6 mg/dL (ref 0.0–1.2)
CO2: 22 mmol/L (ref 20–29)
Calcium: 10.3 mg/dL — ABNORMAL HIGH (ref 8.7–10.2)
Chloride: 104 mmol/L (ref 96–106)
Creatinine, Ser: 1.05 mg/dL (ref 0.76–1.27)
GFR calc Af Amer: 93 mL/min/{1.73_m2} (ref >59)
GFR calc non Af Amer: 81 mL/min/{1.73_m2} (ref >59)
Globulin, Total: 2.3 g/dL (ref 1.5–4.5)
Glucose: 110 mg/dL — ABNORMAL HIGH (ref 65–99)
Potassium: 4.1 mmol/L (ref 3.5–5.2)
Sodium: 144 mmol/L (ref 134–144)
Total Protein: 7 g/dL (ref 6.0–8.5)

## 2019-07-18 LAB — LIPID PANEL
Chol/HDL Ratio: 4.2 ratio (ref 0.0–5.0)
Cholesterol, Total: 169 mg/dL (ref 100–199)
HDL: 40 mg/dL (ref >39)
LDL Chol Calc (NIH): 95 mg/dL (ref 0–99)
Triglycerides: 198 mg/dL — ABNORMAL HIGH (ref 0–149)
VLDL Cholesterol Cal: 34 mg/dL (ref 5–40)

## 2019-07-20 ENCOUNTER — Other Ambulatory Visit: Payer: Self-pay | Admitting: Cardiology

## 2019-07-22 ENCOUNTER — Encounter: Payer: Self-pay | Admitting: *Deleted

## 2019-07-22 ENCOUNTER — Telehealth: Payer: Self-pay | Admitting: *Deleted

## 2019-07-22 NOTE — Telephone Encounter (Signed)
[employer] approved covery my meds

## 2019-08-07 ENCOUNTER — Other Ambulatory Visit: Payer: Self-pay | Admitting: *Deleted

## 2019-08-07 DIAGNOSIS — I10 Essential (primary) hypertension: Secondary | ICD-10-CM

## 2019-08-07 MED ORDER — ATORVASTATIN CALCIUM 80 MG PO TABS: ORAL_TABLET | ORAL | 0 refills | Status: DC

## 2019-08-07 MED ORDER — LISINOPRIL 40 MG PO TABS: 40.0000 mg | ORAL_TABLET | Freq: Every day | ORAL | 0 refills | Status: DC

## 2019-08-21 ENCOUNTER — Other Ambulatory Visit: Payer: Self-pay | Admitting: Cardiology

## 2019-08-25 ENCOUNTER — Other Ambulatory Visit: Payer: Self-pay | Admitting: Cardiology

## 2019-09-22 ENCOUNTER — Other Ambulatory Visit: Payer: Self-pay | Admitting: Cardiology

## 2019-10-14 ENCOUNTER — Other Ambulatory Visit: Payer: Self-pay

## 2019-10-14 ENCOUNTER — Encounter: Payer: Self-pay | Admitting: Emergency Medicine

## 2019-10-14 ENCOUNTER — Ambulatory Visit: Payer: No Typology Code available for payment source | Admitting: Emergency Medicine

## 2019-10-14 VITALS — BP 142/89 | HR 87 | Temp 97.4°F | Resp 16 | Ht 70.0 in | Wt 227.0 lb

## 2019-10-14 DIAGNOSIS — E1159 Type 2 diabetes mellitus with other circulatory complications: Secondary | ICD-10-CM

## 2019-10-14 DIAGNOSIS — E1169 Type 2 diabetes mellitus with other specified complication: Secondary | ICD-10-CM

## 2019-10-14 DIAGNOSIS — E785 Hyperlipidemia, unspecified: Secondary | ICD-10-CM | POA: Diagnosis not present

## 2019-10-14 DIAGNOSIS — I10 Essential (primary) hypertension: Secondary | ICD-10-CM

## 2019-10-14 DIAGNOSIS — I2510 Atherosclerotic heart disease of native coronary artery without angina pectoris: Secondary | ICD-10-CM

## 2019-10-14 DIAGNOSIS — G4733 Obstructive sleep apnea (adult) (pediatric): Secondary | ICD-10-CM | POA: Diagnosis not present

## 2019-10-14 DIAGNOSIS — I152 Hypertension secondary to endocrine disorders: Secondary | ICD-10-CM

## 2019-10-14 LAB — GLUCOSE, POCT (MANUAL RESULT ENTRY): POC Glucose: 111 mg/dl — AB (ref 70–99)

## 2019-10-14 LAB — POCT GLYCOSYLATED HEMOGLOBIN (HGB A1C): Hemoglobin A1C: 6.2 % — AB (ref 4.0–5.6)

## 2019-10-14 NOTE — Progress Notes (Signed)
Clifford Frederick 54 y.o.   Chief Complaint  Patient presents with  . Diabetes    follow up 3 month    HISTORY OF PRESENT ILLNESS: This is a 54 y.o. male with history of diabetes, hypertension, dyslipidemia, and coronary artery disease status post stent placement in 2019 here for follow-up. 1.  Diabetes: Presently taking Metformin, Jardiance and Trulicity.  Does not check blood sugars at home Lab Results  Component Value Date   HGBA1C 7.1 (A) 07/17/2019    2.  Hypertension: On amlodipine 5 mg daily and carvedilol 25 mg twice a day and lisinopril 40 mg daily 3.  Dyslipidemia: On Lipitor 80 mg daily 4.  Coronary artery disease status post stent placement: Presently on Plavix and aspirin.  No recent use of nitroglycerin sublingual. 5.  Active smoker with little motivation to quit.  Counseled about the dangers and cardiovascular risks of smoking. Otherwise doing well.  Has no complaints or medical concerns today.   HPI   Prior to Admission medications   Medication Sig Start Date End Date Taking? Authorizing Provider  amLODipine (NORVASC) 5 MG tablet Take 1 tablet (5 mg total) by mouth daily. Please make overdue appt with Dr. Radford Pax before anymore refills. 2nd attempt 09/25/19  Yes Turner, Eber Hong, MD  aspirin 81 MG chewable tablet Chew 1 tablet (81 mg total) by mouth daily. 08/18/17  Yes Daune Perch, NP  atorvastatin (LIPITOR) 80 MG tablet TAKE 1 TABLET BY MOUTH ONCE DAILY AT 6 IN THE EVENING. 08/07/19  Yes Sagardia, Ines Bloomer, MD  carvedilol (COREG) 25 MG tablet Take 1 tablet (25 mg total) by mouth 2 (two) times daily with a meal. Please make overdue appt with Dr. Radford Pax before anymore refills. 2nd attempt 09/25/19  Yes Turner, Eber Hong, MD  clopidogrel (PLAVIX) 75 MG tablet Take 1 tablet (75 mg total) by mouth daily. Please make overdue appt with Dr. Radford Pax before anymore refills. 2nd attempt 09/25/19  Yes Turner, Eber Hong, MD  JARDIANCE 10 MG TABS tablet Take 1 tablet by mouth once daily  06/24/19  Yes Rutherford Guys, MD  lisinopril (ZESTRIL) 40 MG tablet Take 1 tablet (40 mg total) by mouth daily. 08/07/19  Yes Horald Pollen, MD  metFORMIN (GLUCOPHAGE) 1000 MG tablet Take 1 tablet (1,000 mg total) by mouth 2 (two) times daily with a meal. 07/17/19 10/15/19 Yes Sagardia, Ines Bloomer, MD  nitroGLYCERIN (NITROSTAT) 0.4 MG SL tablet Place 1 tablet (0.4 mg total) under the tongue every 5 (five) minutes as needed for chest pain. 08/17/17  Yes Daune Perch, NP  blood glucose meter kit and supplies Dispense based on patient and insurance preference. Use up to four times daily as directed. (FOR ICD-10 E10.9, E11.9). 06/04/18   Posey Boyer, MD    No Known Allergies  Patient Active Problem List   Diagnosis Date Noted  . Hypertension associated with type 2 diabetes mellitus ([place]) 07/17/2019  . CAD in native artery   . Pure hypercholesterolemia   . Tobacco abuse 08/15/2017  . Dyslipidemia associated with type 2 diabetes mellitus ([place]) 08/15/2017  . Normocytic anemia 08/15/2017  . Essential hypertension, benign 08/08/2012    Past Medical History:  Diagnosis Date  . Abnormal EKG   . Acute coronary syndrome ([place]) 08/15/2017  . CAD in native artery    A. LHC 08/16/17- DES to RCA  . Chest pain 08/15/2017  . Diabetes mellitus type 2 in obese ([place]) 08/15/2017  . Essential hypertension, benign 08/08/2012  .  Fracture of lateral malleolus of left ankle 08/07/2012  . High cholesterol   . Hypertensive urgency 08/15/2017  . Sleep apnea   . Tobacco abuse 08/15/2017  . Unstable angina [facility])     Past Surgical History:  Procedure Laterality Date  . CORONARY STENT INTERVENTION N/A 08/16/2017   Procedure: CORONARY STENT INTERVENTION;  Surgeon: Jettie Booze, MD;  Location: [place] [facility];  Service: Cardiovascular;  Laterality: N/A;  . LEFT HEART CATH AND CORONARY ANGIOGRAPHY N/A 08/16/2017   Procedure: LEFT HEART CATH AND CORONARY ANGIOGRAPHY;  Surgeon: Jettie Booze, MD;   Location: [place] [facility];  Service: Cardiovascular;  Laterality: N/A;  . ORIF ANKLE FRACTURE Left 08/07/2012   Procedure: OPEN REDUCTION INTERNAL FIXATION (ORIF) LEFT ANKLE FRACTURE;  Surgeon: Mcarthur Rossetti, MD;  Location: [facility];  Service: Orthopedics;  Laterality: Left;    Social History   Socioeconomic History  . Marital status: Married    Spouse name: Not on file  . Number of children: Not on file  . Years of education: Not on file  . Highest education level: Not on file  Occupational History  . Not on file  Tobacco Use  . Smoking status: Current Some Day Smoker    Packs/day: 0.25    Years: 20.00    Pack years: 5.00    Types: Cigarettes    Start date: 08/16/2017  . Smokeless tobacco: Never Used  . Tobacco comment: currently smokes 3 cigarettes a day  Substance and Sexual Activity  . Alcohol use: Yes    Alcohol/week: 6.0 standard drinks    Types: 6 Cans of beer per week    Comment: occ  . Drug use: No  . Sexual activity: Not on file  Other Topics Concern  . Not on file  Social History Narrative  . Not on file   Social Determinants of Health   Financial Resource Strain:   . Difficulty of Paying Living Expenses:   Food Insecurity:   . Worried About Charity fundraiser in the Last Year:   . Arboriculturist in the Last Year:   Transportation Needs:   . Film/video editor (Medical):   Marland Kitchen Lack of Transportation (Non-Medical):   Physical Activity:   . Days of Exercise per Week:   . Minutes of Exercise per Session:   Stress:   . Feeling of Stress :   Social Connections:   . Frequency of Communication with Friends and Family:   . Frequency of Social Gatherings with Friends and Family:   . Attends Religious Services:   . Active Member of Clubs or Organizations:   . Attends Archivist Meetings:   Marland Kitchen Marital Status:   Intimate Partner Violence:   . Fear of Current or Ex-Partner:   . Emotionally Abused:   Marland Kitchen Physically Abused:   . Sexually  Abused:     Family History  Problem Relation Age of Onset  . Diabetes Mother   . Hypertension Mother   . Diabetes Brother   . Hypertension Brother      Review of Systems  Constitutional: Negative.  Negative for chills and fever.  HENT: Negative.  Negative for sore throat.   Respiratory: Negative.  Negative for cough and shortness of breath.   Cardiovascular: Negative.  Negative for chest pain and palpitations.  Gastrointestinal: Negative.  Negative for abdominal pain, blood in stool, diarrhea, melena, nausea and vomiting.  Genitourinary: Negative.  Negative for dysuria and hematuria.  Musculoskeletal:  Negative.  Negative for myalgias.  Skin: Negative.  Negative for rash.  Neurological: Negative.  Negative for dizziness and headaches.  All other systems reviewed and are negative.  Vitals:   10/14/19 0829  BP: (!) 142/89  Pulse: 87  Resp: 16  Temp: (!) 97.4 F (36.3 C)  SpO2: 97%     Physical Exam Vitals reviewed.  Constitutional:      Appearance: Normal appearance.  HENT:     Head: Normocephalic.  Eyes:     Extraocular Movements: Extraocular movements intact.     Conjunctiva/sclera: Conjunctivae normal.     Pupils: Pupils are equal, round, and reactive to light.  Cardiovascular:     Rate and Rhythm: Normal rate and regular rhythm.     Pulses: Normal pulses.     Heart sounds: Normal heart sounds.  Pulmonary:     Effort: Pulmonary effort is normal.     Breath sounds: Normal breath sounds.  Musculoskeletal:        General: Normal range of motion.     Cervical back: Normal range of motion and neck supple.  Skin:    General: Skin is warm and dry.     Capillary Refill: Capillary refill takes less than 2 seconds.  Neurological:     General: No focal deficit present.     Mental Status: He is alert and oriented to person, place, and time.  Psychiatric:        Mood and Affect: Mood normal.        Behavior: Behavior normal.    Results for orders placed or  performed in visit on 10/14/19 (from the past 24 hour(s))  POCT glucose (manual entry)     Status: Abnormal   Collection Time: 10/14/19  9:20 AM  Result Value Ref Range   POC Glucose 111 (A) 70 - 99 mg/dl  POCT glycosylated hemoglobin (Hb A1C)     Status: Abnormal   Collection Time: 10/14/19  9:20 AM  Result Value Ref Range   Hemoglobin A1C 6.2 (A) 4.0 - 5.6 %   HbA1c POC (<> result, manual entry)     HbA1c, POC (prediabetic range)     HbA1c, POC (controlled diabetic range)     A total of 30 minutes was spent with the patient, greater than 50% of which was in counseling/coordination of care regarding chronic medical problems including hypertension diabetes, cardiovascular risks associated with these conditions, review of all medications, review of most recent blood work including today's hemoglobin A1c, diet and nutrition, review of most recent office visit notes, prognosis, and need for follow-up with cardiologist and with me in 3 months..   ASSESSMENT & PLAN: Hypertension associated with type 2 diabetes mellitus ([place]) Blood pressure well controlled.  Continue present medication.  No changes. Diabetes much better with hemoglobin A1c of 6.2. Continue statin therapy and daily aspirin.  Diet and nutrition discussed. Follow-up in 3 months.  Abhijot was seen today for diabetes.  Diagnoses and all orders for this visit:  Dyslipidemia associated with type 2 diabetes mellitus ([place]) -     Lipid panel -     POCT glucose (manual entry) -     POCT glycosylated hemoglobin (Hb A1C)  Hypertension associated with type 2 diabetes mellitus ([facility]) -     CBC with Differential/Platelet -     Comprehensive metabolic panel  OSA (obstructive sleep apnea)  CAD in native artery    Patient Instructions       If you have lab work done  today you will be contacted with your lab results within the next 2 weeks.  If you have not heard from [place] then please contact us. The fastest way to get your results  is to register for My Chart.   IF you received an x-ray today, you will receive an invoice from [facility]. Please contact [facility] at [phone] with questions or concerns regarding your invoice.   IF you received labwork today, you will receive an invoice from [facility]. Please contact [facility] at [phone] with questions or concerns regarding your invoice.   Our billing staff will not be able to assist you with questions regarding bills from these companies.  You will be contacted with the lab results as soon as they are available. The fastest way to get your results is to activate your My Chart account. Instructions are located on the last page of this paperwork. If you have not heard from [place] regarding the results in 2 weeks, please contact this office.     Diabetes Mellitus and Nutrition, Adult When you have diabetes (diabetes mellitus), it is very important to have healthy eating habits because your blood sugar (glucose) levels are greatly affected by what you eat and drink. Eating healthy foods in the appropriate amounts, at about the same times every day, can help you:  Control your blood glucose.  Lower your risk of heart disease.  Improve your blood pressure.  Reach or maintain a healthy weight. Every person with diabetes is different, and each person has different needs for a meal plan. Your health care provider may recommend that you work with a diet and nutrition specialist (dietitian) to make a meal plan that is best for you. Your meal plan may vary depending on factors such as:  The calories you need.  The medicines you take.  Your weight.  Your blood glucose, blood pressure, and cholesterol levels.  Your activity level.  Other health conditions you have, such as heart or kidney disease. How do carbohydrates affect me? Carbohydrates, also called carbs, affect your blood glucose level more than any other type of food. Eating carbs naturally  raises the amount of glucose in your blood. Carb counting is a method for keeping track of how many carbs you eat. Counting carbs is important to keep your blood glucose at a healthy level, especially if you use insulin or take certain oral diabetes medicines. It is important to know how many carbs you can safely have in each meal. This is different for every person. Your dietitian can help you calculate how many carbs you should have at each meal and for each snack. Foods that contain carbs include:  Bread, cereal, rice, pasta, and crackers.  Potatoes and corn.  Peas, beans, and lentils.  Milk and yogurt.  Fruit and juice.  Desserts, such as cakes, cookies, ice cream, and candy. How does alcohol affect me? Alcohol can cause a sudden decrease in blood glucose (hypoglycemia), especially if you use insulin or take certain oral diabetes medicines. Hypoglycemia can be a life-threatening condition. Symptoms of hypoglycemia (sleepiness, dizziness, and confusion) are similar to symptoms of having too much alcohol. If your health care provider says that alcohol is safe for you, follow these guidelines:  Limit alcohol intake to no more than 1 drink per day for nonpregnant women and 2 drinks per day for men. One drink equals 12 oz of beer, 5 oz of wine, or 1 oz of hard liquor.  Do not drink on an empty stomach.  Keep yourself hydrated with water, diet soda, or unsweetened iced tea.  Keep in mind that regular soda, juice, and other mixers may contain a lot of sugar and must be counted as carbs. What are tips for following this plan?  Reading food labels  Start by checking the serving size on the "Nutrition Facts" label of packaged foods and drinks. The amount of calories, carbs, fats, and other nutrients listed on the label is based on one serving of the item. Many items contain more than one serving per package.  Check the total grams (g) of carbs in one serving. You can calculate the number  of servings of carbs in one serving by dividing the total carbs by 15. For example, if a food has 30 g of total carbs, it would be equal to 2 servings of carbs.  Check the number of grams (g) of saturated and trans fats in one serving. Choose foods that have low or no amount of these fats.  Check the number of milligrams (mg) of salt (sodium) in one serving. Most people should limit total sodium intake to less than 2,300 mg per day.  Always check the nutrition information of foods labeled as "low-fat" or "nonfat". These foods may be higher in added sugar or refined carbs and should be avoided.  Talk to your dietitian to identify your daily goals for nutrients listed on the label. Shopping  Avoid buying canned, premade, or processed foods. These foods tend to be high in fat, sodium, and added sugar.  Shop around the outside edge of the grocery store. This includes fresh fruits and vegetables, bulk grains, fresh meats, and fresh dairy. Cooking  Use low-heat cooking methods, such as baking, instead of high-heat cooking methods like deep frying.  Cook using healthy oils, such as olive, canola, or sunflower oil.  Avoid cooking with butter, cream, or high-fat meats. Meal planning  Eat meals and snacks regularly, preferably at the same times every day. Avoid going long periods of time without eating.  Eat foods high in fiber, such as fresh fruits, vegetables, beans, and whole grains. Talk to your dietitian about how many servings of carbs you can eat at each meal.  Eat 4-6 ounces (oz) of lean protein each day, such as lean meat, chicken, fish, eggs, or tofu. One oz of lean protein is equal to: ? 1 oz of meat, chicken, or fish. ? 1 egg. ?  cup of tofu.  Eat some foods each day that contain healthy fats, such as avocado, nuts, seeds, and fish. Lifestyle  Check your blood glucose regularly.  Exercise regularly as told by your health care provider. This may include: ? 150 minutes of  moderate-intensity or vigorous-intensity exercise each week. This could be brisk walking, biking, or water aerobics. ? Stretching and doing strength exercises, such as yoga or weightlifting, at least 2 times a week.  Take medicines as told by your health care provider.  Do not use any products that contain nicotine or tobacco, such as cigarettes and e-cigarettes. If you need help quitting, ask your health care provider.  Work with a Social worker or diabetes educator to identify strategies to manage stress and any emotional and social challenges. Questions to ask a health care provider  Do I need to meet with a diabetes educator?  Do I need to meet with a dietitian?  What number can I call if I have questions?  When are the best times to check my blood glucose? Where to find more  information:  American Diabetes Association: diabetes.org  Academy of Nutrition and Dietetics: www.eatright.[employer] of Diabetes and Digestive and Kidney Diseases (NIH): [employer] Summary  A healthy meal plan will help you control your blood glucose and maintain a healthy lifestyle.  Working with a diet and nutrition specialist (dietitian) can help you make a meal plan that is best for you.  Keep in mind that carbohydrates (carbs) and alcohol have immediate effects on your blood glucose levels. It is important to count carbs and to use alcohol carefully. This information is not intended to replace advice given to you by your health care provider. Make sure you discuss any questions you have with your health care provider. Document Revised: 05/05/2017 Document Reviewed: 06/27/2016 Elsevier Patient Education  [place].  Hypertension, Adult High blood pressure (hypertension) is when the force of blood pumping through the arteries is too strong. The arteries are the blood vessels that carry blood from the heart throughout the body. Hypertension forces the heart to work harder to  pump blood and may cause arteries to become narrow or stiff. Untreated or uncontrolled hypertension can cause a heart attack, heart failure, a stroke, kidney disease, and other problems. A blood pressure reading consists of a higher number over a lower number. Ideally, your blood pressure should be below 120/80. The first ("top") number is called the systolic pressure. It is a measure of the pressure in your arteries as your heart beats. The second ("bottom") number is called the diastolic pressure. It is a measure of the pressure in your arteries as the heart relaxes. What are the causes? The exact cause of this condition is not known. There are some conditions that result in or are related to high blood pressure. What increases the risk? Some risk factors for high blood pressure are under your control. The following factors may make you more likely to develop this condition:  Smoking.  Having type 2 diabetes mellitus, high cholesterol, or both.  Not getting enough exercise or physical activity.  Being overweight.  Having too much fat, sugar, calories, or salt (sodium) in your diet.  Drinking too much alcohol. Some risk factors for high blood pressure may be difficult or impossible to change. Some of these factors include:  Having chronic kidney disease.  Having a family history of high blood pressure.  Age. Risk increases with age.  Race. You may be at higher risk if you are African American.  Gender. Men are at higher risk than women before age 46. After age 27, women are at higher risk than men.  Having obstructive sleep apnea.  Stress. What are the signs or symptoms? High blood pressure may not cause symptoms. Very high blood pressure (hypertensive crisis) may cause:  Headache.  Anxiety.  Shortness of breath.  Nosebleed.  Nausea and vomiting.  Vision changes.  Severe chest pain.  Seizures. How is this diagnosed? This condition is diagnosed by measuring your  blood pressure while you are seated, with your arm resting on a flat surface, your legs uncrossed, and your feet flat on the floor. The cuff of the blood pressure monitor will be placed directly against the skin of your upper arm at the level of your heart. It should be measured at least twice using the same arm. Certain conditions can cause a difference in blood pressure between your right and left arms. Certain factors can cause blood pressure readings to be lower or higher than normal for a short period of time:  When your blood pressure is higher when you are in a health care provider's office than when you are at home, this is called white coat hypertension. Most people with this condition do not need medicines.  When your blood pressure is higher at home than when you are in a health care provider's office, this is called masked hypertension. Most people with this condition may need medicines to control blood pressure. If you have a high blood pressure reading during one visit or you have normal blood pressure with other risk factors, you may be asked to:  Return on a different day to have your blood pressure checked again.  Monitor your blood pressure at home for 1 week or longer. If you are diagnosed with hypertension, you may have other blood or imaging tests to help your health care provider understand your overall risk for other conditions. How is this treated? This condition is treated by making healthy lifestyle changes, such as eating healthy foods, exercising more, and reducing your alcohol intake. Your health care provider may prescribe medicine if lifestyle changes are not enough to get your blood pressure under control, and if:  Your systolic blood pressure is above 130.  Your diastolic blood pressure is above 80. Your personal target blood pressure may vary depending on your medical conditions, your age, and other factors. Follow these instructions at home: Eating and  drinking   Eat a diet that is high in fiber and potassium, and low in sodium, added sugar, and fat. An example eating plan is called the DASH (Dietary Approaches to Stop Hypertension) diet. To eat this way: ? Eat plenty of fresh fruits and vegetables. Try to fill one half of your plate at each meal with fruits and vegetables. ? Eat whole grains, such as whole-wheat pasta, brown rice, or whole-grain bread. Fill about one fourth of your plate with whole grains. ? Eat or drink low-fat dairy products, such as skim milk or low-fat yogurt. ? Avoid fatty cuts of meat, processed or cured meats, and poultry with skin. Fill about one fourth of your plate with lean proteins, such as fish, chicken without skin, beans, eggs, or tofu. ? Avoid pre-made and processed foods. These tend to be higher in sodium, added sugar, and fat.  Reduce your daily sodium intake. Most people with hypertension should eat less than 1,500 mg of sodium a day.  Do not drink alcohol if: ? Your health care provider tells you not to drink. ? You are pregnant, may be pregnant, or are planning to become pregnant.  If you drink alcohol: ? Limit how much you use to:  0-1 drink a day for women.  0-2 drinks a day for men. ? Be aware of how much alcohol is in your drink. In the [place] one drink equals one 12 oz bottle of beer (355 mL), one 5 oz glass of wine (148 mL), or one 1 oz glass of hard liquor (44 mL). Lifestyle   Work with your health care provider to maintain a healthy body weight or to lose weight. Ask what an ideal weight is for you.  Get at least 30 minutes of exercise most days of the week. Activities may include walking, swimming, or biking.  Include exercise to strengthen your muscles (resistance exercise), such as Pilates or lifting weights, as part of your weekly exercise routine. Try to do these types of exercises for 30 minutes at least 3 days a week.  Do not use any products that contain nicotine  or tobacco,  such as cigarettes, e-cigarettes, and chewing tobacco. If you need help quitting, ask your health care provider.  Monitor your blood pressure at home as told by your health care provider.  Keep all follow-up visits as told by your health care provider. This is important. Medicines  Take over-the-counter and prescription medicines only as told by your health care provider. Follow directions carefully. Blood pressure medicines must be taken as prescribed.  Do not skip doses of blood pressure medicine. Doing this puts you at risk for problems and can make the medicine less effective.  Ask your health care provider about side effects or reactions to medicines that you should watch for. Contact a health care provider if you:  Think you are having a reaction to a medicine you are taking.  Have headaches that keep coming back (recurring).  Feel dizzy.  Have swelling in your ankles.  Have trouble with your vision. Get help right away if you:  Develop a severe headache or confusion.  Have unusual weakness or numbness.  Feel faint.  Have severe pain in your chest or abdomen.  Vomit repeatedly.  Have trouble breathing. Summary  Hypertension is when the force of blood pumping through your arteries is too strong. If this condition is not controlled, it may put you at risk for serious complications.  Your personal target blood pressure may vary depending on your medical conditions, your age, and other factors. For most people, a normal blood pressure is less than 120/80.  Hypertension is treated with lifestyle changes, medicines, or a combination of both. Lifestyle changes include losing weight, eating a healthy, low-sodium diet, exercising more, and limiting alcohol. This information is not intended to replace advice given to you by your health care provider. Make sure you discuss any questions you have with your health care provider. Document Revised: 01/31/2018 Document Reviewed:  01/31/2018 Elsevier Patient Education  2020 Elsevier Inc.      Agustina Caroli, MD [facility]

## 2019-10-14 NOTE — Patient Instructions (Addendum)
   If you have lab work done today you will be contacted with your lab results within the next 2 weeks.  If you have not heard from us then please contact us. The fastest way to get your results is to register for My Chart.   IF you received an x-ray today, you will receive an invoice from [facility]. Please contact  Radiology at [phone] with questions or concerns regarding your invoice.   IF you received labwork today, you will receive an invoice from [facility]. Please contact [facility] at [phone] with questions or concerns regarding your invoice.   Our billing staff will not be able to assist you with questions regarding bills from these companies.  You will be contacted with the lab results as soon as they are available. The fastest way to get your results is to activate your My Chart account. Instructions are located on the last page of this paperwork. If you have not heard from us regarding the results in 2 weeks, please contact this office.     Diabetes Mellitus and Nutrition, Adult When you have diabetes (diabetes mellitus), it is very important to have healthy eating habits because your blood sugar (glucose) levels are greatly affected by what you eat and drink. Eating healthy foods in the appropriate amounts, at about the same times every day, can help you:  Control your blood glucose.  Lower your risk of heart disease.  Improve your blood pressure.  Reach or maintain a healthy weight. Every person with diabetes is different, and each person has different needs for a meal plan. Your health care provider may recommend that you work with a diet and nutrition specialist (dietitian) to make a meal plan that is best for you. Your meal plan may vary depending on factors such as:  The calories you need.  The medicines you take.  Your weight.  Your blood glucose, blood pressure, and cholesterol levels.  Your activity level.  Other health conditions  you have, such as heart or kidney disease. How do carbohydrates affect me? Carbohydrates, also called carbs, affect your blood glucose level more than any other type of food. Eating carbs naturally raises the amount of glucose in your blood. Carb counting is a method for keeping track of how many carbs you eat. Counting carbs is important to keep your blood glucose at a healthy level, especially if you use insulin or take certain oral diabetes medicines. It is important to know how many carbs you can safely have in each meal. This is different for every person. Your dietitian can help you calculate how many carbs you should have at each meal and for each snack. Foods that contain carbs include:  Bread, cereal, rice, pasta, and crackers.  Potatoes and corn.  Peas, beans, and lentils.  Milk and yogurt.  Fruit and juice.  Desserts, such as cakes, cookies, ice cream, and candy. How does alcohol affect me? Alcohol can cause a sudden decrease in blood glucose (hypoglycemia), especially if you use insulin or take certain oral diabetes medicines. Hypoglycemia can be a life-threatening condition. Symptoms of hypoglycemia (sleepiness, dizziness, and confusion) are similar to symptoms of having too much alcohol. If your health care provider says that alcohol is safe for you, follow these guidelines:  Limit alcohol intake to no more than 1 drink per day for nonpregnant women and 2 drinks per day for men. One drink equals 12 oz of beer, 5 oz of wine, or 1 oz of hard liquor.    Do not drink on an empty stomach.  Keep yourself hydrated with water, diet soda, or unsweetened iced tea.  Keep in mind that regular soda, juice, and other mixers may contain a lot of sugar and must be counted as carbs. What are tips for following this plan?  Reading food labels  Start by checking the serving size on the "Nutrition Facts" label of packaged foods and drinks. The amount of calories, carbs, fats, and other  nutrients listed on the label is based on one serving of the item. Many items contain more than one serving per package.  Check the total grams (g) of carbs in one serving. You can calculate the number of servings of carbs in one serving by dividing the total carbs by 15. For example, if a food has 30 g of total carbs, it would be equal to 2 servings of carbs.  Check the number of grams (g) of saturated and trans fats in one serving. Choose foods that have low or no amount of these fats.  Check the number of milligrams (mg) of salt (sodium) in one serving. Most people should limit total sodium intake to less than 2,300 mg per day.  Always check the nutrition information of foods labeled as "low-fat" or "nonfat". These foods may be higher in added sugar or refined carbs and should be avoided.  Talk to your dietitian to identify your daily goals for nutrients listed on the label. Shopping  Avoid buying canned, premade, or processed foods. These foods tend to be high in fat, sodium, and added sugar.  Shop around the outside edge of the grocery store. This includes fresh fruits and vegetables, bulk grains, fresh meats, and fresh dairy. Cooking  Use low-heat cooking methods, such as baking, instead of high-heat cooking methods like deep frying.  Cook using healthy oils, such as olive, canola, or sunflower oil.  Avoid cooking with butter, cream, or high-fat meats. Meal planning  Eat meals and snacks regularly, preferably at the same times every day. Avoid going long periods of time without eating.  Eat foods high in fiber, such as fresh fruits, vegetables, beans, and whole grains. Talk to your dietitian about how many servings of carbs you can eat at each meal.  Eat 4-6 ounces (oz) of lean protein each day, such as lean meat, chicken, fish, eggs, or tofu. One oz of lean protein is equal to: ? 1 oz of meat, chicken, or fish. ? 1 egg. ?  cup of tofu.  Eat some foods each day that contain  healthy fats, such as avocado, nuts, seeds, and fish. Lifestyle  Check your blood glucose regularly.  Exercise regularly as told by your health care provider. This may include: ? 150 minutes of moderate-intensity or vigorous-intensity exercise each week. This could be brisk walking, biking, or water aerobics. ? Stretching and doing strength exercises, such as yoga or weightlifting, at least 2 times a week.  Take medicines as told by your health care provider.  Do not use any products that contain nicotine or tobacco, such as cigarettes and e-cigarettes. If you need help quitting, ask your health care provider.  Work with a counselor or diabetes educator to identify strategies to manage stress and any emotional and social challenges. Questions to ask a health care provider  Do I need to meet with a diabetes educator?  Do I need to meet with a dietitian?  What number can I call if I have questions?  When are the best times to   check my blood glucose? Where to find more information:  American Diabetes Association: diabetes.org  Academy of Nutrition and Dietetics: www.eatright.org  National Institute of Diabetes and Digestive and Kidney Diseases (NIH): www.niddk.nih.gov Summary  A healthy meal plan will help you control your blood glucose and maintain a healthy lifestyle.  Working with a diet and nutrition specialist (dietitian) can help you make a meal plan that is best for you.  Keep in mind that carbohydrates (carbs) and alcohol have immediate effects on your blood glucose levels. It is important to count carbs and to use alcohol carefully. This information is not intended to replace advice given to you by your health care provider. Make sure you discuss any questions you have with your health care provider. Document Revised: 05/05/2017 Document Reviewed: 06/27/2016 Elsevier Patient Education  2020 Elsevier Inc.  Hypertension, Adult High blood pressure (hypertension) is when  the force of blood pumping through the arteries is too strong. The arteries are the blood vessels that carry blood from the heart throughout the body. Hypertension forces the heart to work harder to pump blood and may cause arteries to become narrow or stiff. Untreated or uncontrolled hypertension can cause a heart attack, heart failure, a stroke, kidney disease, and other problems. A blood pressure reading consists of a higher number over a lower number. Ideally, your blood pressure should be below 120/80. The first ("top") number is called the systolic pressure. It is a measure of the pressure in your arteries as your heart beats. The second ("bottom") number is called the diastolic pressure. It is a measure of the pressure in your arteries as the heart relaxes. What are the causes? The exact cause of this condition is not known. There are some conditions that result in or are related to high blood pressure. What increases the risk? Some risk factors for high blood pressure are under your control. The following factors may make you more likely to develop this condition:  Smoking.  Having type 2 diabetes mellitus, high cholesterol, or both.  Not getting enough exercise or physical activity.  Being overweight.  Having too much fat, sugar, calories, or salt (sodium) in your diet.  Drinking too much alcohol. Some risk factors for high blood pressure may be difficult or impossible to change. Some of these factors include:  Having chronic kidney disease.  Having a family history of high blood pressure.  Age. Risk increases with age.  Race. You may be at higher risk if you are African American.  Gender. Men are at higher risk than women before age 45. After age 65, women are at higher risk than men.  Having obstructive sleep apnea.  Stress. What are the signs or symptoms? High blood pressure may not cause symptoms. Very high blood pressure (hypertensive crisis) may  cause:  Headache.  Anxiety.  Shortness of breath.  Nosebleed.  Nausea and vomiting.  Vision changes.  Severe chest pain.  Seizures. How is this diagnosed? This condition is diagnosed by measuring your blood pressure while you are seated, with your arm resting on a flat surface, your legs uncrossed, and your feet flat on the floor. The cuff of the blood pressure monitor will be placed directly against the skin of your upper arm at the level of your heart. It should be measured at least twice using the same arm. Certain conditions can cause a difference in blood pressure between your right and left arms. Certain factors can cause blood pressure readings to be lower or higher   than normal for a short period of time:  When your blood pressure is higher when you are in a health care provider's office than when you are at home, this is called white coat hypertension. Most people with this condition do not need medicines.  When your blood pressure is higher at home than when you are in a health care provider's office, this is called masked hypertension. Most people with this condition may need medicines to control blood pressure. If you have a high blood pressure reading during one visit or you have normal blood pressure with other risk factors, you may be asked to:  Return on a different day to have your blood pressure checked again.  Monitor your blood pressure at home for 1 week or longer. If you are diagnosed with hypertension, you may have other blood or imaging tests to help your health care provider understand your overall risk for other conditions. How is this treated? This condition is treated by making healthy lifestyle changes, such as eating healthy foods, exercising more, and reducing your alcohol intake. Your health care provider may prescribe medicine if lifestyle changes are not enough to get your blood pressure under control, and if:  Your systolic blood pressure is above  130.  Your diastolic blood pressure is above 80. Your personal target blood pressure may vary depending on your medical conditions, your age, and other factors. Follow these instructions at home: Eating and drinking   Eat a diet that is high in fiber and potassium, and low in sodium, added sugar, and fat. An example eating plan is called the DASH (Dietary Approaches to Stop Hypertension) diet. To eat this way: ? Eat plenty of fresh fruits and vegetables. Try to fill one half of your plate at each meal with fruits and vegetables. ? Eat whole grains, such as whole-wheat pasta, brown rice, or whole-grain bread. Fill about one fourth of your plate with whole grains. ? Eat or drink low-fat dairy products, such as skim milk or low-fat yogurt. ? Avoid fatty cuts of meat, processed or cured meats, and poultry with skin. Fill about one fourth of your plate with lean proteins, such as fish, chicken without skin, beans, eggs, or tofu. ? Avoid pre-made and processed foods. These tend to be higher in sodium, added sugar, and fat.  Reduce your daily sodium intake. Most people with hypertension should eat less than 1,500 mg of sodium a day.  Do not drink alcohol if: ? Your health care provider tells you not to drink. ? You are pregnant, may be pregnant, or are planning to become pregnant.  If you drink alcohol: ? Limit how much you use to:  0-1 drink a day for women.  0-2 drinks a day for men. ? Be aware of how much alcohol is in your drink. In the [place] one drink equals one 12 oz bottle of beer (355 mL), one 5 oz glass of wine (148 mL), or one 1 oz glass of hard liquor (44 mL). Lifestyle   Work with your health care provider to maintain a healthy body weight or to lose weight. Ask what an ideal weight is for you.  Get at least 30 minutes of exercise most days of the week. Activities may include walking, swimming, or biking.  Include exercise to strengthen your muscles (resistance exercise),  such as Pilates or lifting weights, as part of your weekly exercise routine. Try to do these types of exercises for 30 minutes at least 3 days a   week.  Do not use any products that contain nicotine or tobacco, such as cigarettes, e-cigarettes, and chewing tobacco. If you need help quitting, ask your health care provider.  Monitor your blood pressure at home as told by your health care provider.  Keep all follow-up visits as told by your health care provider. This is important. Medicines  Take over-the-counter and prescription medicines only as told by your health care provider. Follow directions carefully. Blood pressure medicines must be taken as prescribed.  Do not skip doses of blood pressure medicine. Doing this puts you at risk for problems and can make the medicine less effective.  Ask your health care provider about side effects or reactions to medicines that you should watch for. Contact a health care provider if you:  Think you are having a reaction to a medicine you are taking.  Have headaches that keep coming back (recurring).  Feel dizzy.  Have swelling in your ankles.  Have trouble with your vision. Get help right away if you:  Develop a severe headache or confusion.  Have unusual weakness or numbness.  Feel faint.  Have severe pain in your chest or abdomen.  Vomit repeatedly.  Have trouble breathing. Summary  Hypertension is when the force of blood pumping through your arteries is too strong. If this condition is not controlled, it may put you at risk for serious complications.  Your personal target blood pressure may vary depending on your medical conditions, your age, and other factors. For most people, a normal blood pressure is less than 120/80.  Hypertension is treated with lifestyle changes, medicines, or a combination of both. Lifestyle changes include losing weight, eating a healthy, low-sodium diet, exercising more, and limiting alcohol. This  information is not intended to replace advice given to you by your health care provider. Make sure you discuss any questions you have with your health care provider. Document Revised: 01/31/2018 Document Reviewed: 01/31/2018 Elsevier Patient Education  2020 Elsevier Inc.  

## 2019-10-14 NOTE — Assessment & Plan Note (Signed)
Blood pressure well controlled.  Continue present medication.  No changes. Diabetes much better with hemoglobin A1c of 6.2. Continue statin therapy and daily aspirin.  Diet and nutrition discussed. Follow-up in 3 months.

## 2019-10-15 ENCOUNTER — Encounter: Payer: Self-pay | Admitting: Emergency Medicine

## 2019-10-15 LAB — CBC WITH DIFFERENTIAL/PLATELET
Basophils Absolute: 0 10*3/uL (ref 0.0–0.2)
Basos: 0 %
EOS (ABSOLUTE): 0.2 10*3/uL (ref 0.0–0.4)
Eos: 3 %
Hematocrit: 40.6 % (ref 37.5–51.0)
Hemoglobin: 13.4 g/dL (ref 13.0–17.7)
Immature Grans (Abs): 0 10*3/uL (ref 0.0–0.1)
Immature Granulocytes: 0 %
Lymphocytes Absolute: 2 10*3/uL (ref 0.7–3.1)
Lymphs: 25 %
MCH: 28.9 pg (ref 26.6–33.0)
MCHC: 33 g/dL (ref 31.5–35.7)
MCV: 88 fL (ref 79–97)
Monocytes Absolute: 0.6 10*3/uL (ref 0.1–0.9)
Monocytes: 8 %
Neutrophils Absolute: 5.1 10*3/uL (ref 1.4–7.0)
Neutrophils: 64 %
Platelets: 205 10*3/uL (ref 150–450)
RBC: 4.64 x10E6/uL (ref 4.14–5.80)
RDW: 14 % (ref 11.6–15.4)
WBC: 8 10*3/uL (ref 3.4–10.8)

## 2019-10-15 LAB — LIPID PANEL
Chol/HDL Ratio: 2.8 ratio (ref 0.0–5.0)
Cholesterol, Total: 125 mg/dL (ref 100–199)
HDL: 45 mg/dL (ref >39)
LDL Chol Calc (NIH): 64 mg/dL (ref 0–99)
Triglycerides: 79 mg/dL (ref 0–149)
VLDL Cholesterol Cal: 16 mg/dL (ref 5–40)

## 2019-10-15 LAB — COMPREHENSIVE METABOLIC PANEL
ALT: 24 IU/L (ref 0–44)
AST: 29 IU/L (ref 0–40)
Albumin/Globulin Ratio: 2.1 (ref 1.2–2.2)
Albumin: 4.6 g/dL (ref 3.8–4.9)
Alkaline Phosphatase: 62 IU/L (ref 39–117)
BUN/Creatinine Ratio: 14 (ref 9–20)
BUN: 13 mg/dL (ref 6–24)
Bilirubin Total: 0.7 mg/dL (ref 0.0–1.2)
CO2: 18 mmol/L — ABNORMAL LOW (ref 20–29)
Calcium: 9.8 mg/dL (ref 8.7–10.2)
Chloride: 105 mmol/L (ref 96–106)
Creatinine, Ser: 0.9 mg/dL (ref 0.76–1.27)
GFR calc Af Amer: 112 mL/min/{1.73_m2} (ref >59)
GFR calc non Af Amer: 97 mL/min/{1.73_m2} (ref >59)
Globulin, Total: 2.2 g/dL (ref 1.5–4.5)
Glucose: 103 mg/dL — ABNORMAL HIGH (ref 65–99)
Potassium: 4.6 mmol/L (ref 3.5–5.2)
Sodium: 142 mmol/L (ref 134–144)
Total Protein: 6.8 g/dL (ref 6.0–8.5)

## 2019-10-16 ENCOUNTER — Other Ambulatory Visit: Payer: Self-pay | Admitting: Cardiology

## 2019-10-21 ENCOUNTER — Telehealth: Payer: Self-pay | Admitting: Cardiology

## 2019-10-21 MED ORDER — CLOPIDOGREL BISULFATE 75 MG PO TABS: 75.0000 mg | ORAL_TABLET | Freq: Every day | ORAL | 0 refills | Status: DC

## 2019-10-21 MED ORDER — AMLODIPINE BESYLATE 5 MG PO TABS: 5.0000 mg | ORAL_TABLET | Freq: Every day | ORAL | 0 refills | Status: DC

## 2019-10-21 MED ORDER — CARVEDILOL 25 MG PO TABS: 25.0000 mg | ORAL_TABLET | Freq: Two times a day (BID) | ORAL | 0 refills | Status: DC

## 2019-10-21 NOTE — Telephone Encounter (Signed)
  *  STAT* If patient is at the pharmacy, call can be transferred to refill team.   1. Which medications need to be refilled? (please list name of each medication and dose if known)   amLODipine (NORVASC) 5 MG tablet  carvedilol (COREG) 25 MG tablet  clopidogrel (PLAVIX) 75 MG tablet    2. Which pharmacy/location (including street and city if local pharmacy) is medication to be sent to? [id number] - [place] ([place]), [place] - [address]  3. Do they need a 30 day or 90 day supply? 30 days  Pt has appt on 10/30/19 with Dr. Mayford Knife

## 2019-10-21 NOTE — Telephone Encounter (Signed)
Pt's medications were sent to pt's pharmacy as requested. Confirmation received.  

## 2019-10-30 ENCOUNTER — Encounter: Payer: Self-pay | Admitting: Cardiology

## 2019-10-30 ENCOUNTER — Ambulatory Visit (INDEPENDENT_AMBULATORY_CARE_PROVIDER_SITE_OTHER): Payer: No Typology Code available for payment source | Admitting: Cardiology

## 2019-10-30 ENCOUNTER — Other Ambulatory Visit: Payer: Self-pay

## 2019-10-30 VITALS — BP 142/92 | HR 94 | Ht 70.0 in | Wt 229.0 lb

## 2019-10-30 DIAGNOSIS — E7800 Pure hypercholesterolemia, unspecified: Secondary | ICD-10-CM | POA: Diagnosis not present

## 2019-10-30 DIAGNOSIS — I2510 Atherosclerotic heart disease of native coronary artery without angina pectoris: Secondary | ICD-10-CM

## 2019-10-30 DIAGNOSIS — R072 Precordial pain: Secondary | ICD-10-CM | POA: Diagnosis not present

## 2019-10-30 DIAGNOSIS — I10 Essential (primary) hypertension: Secondary | ICD-10-CM | POA: Diagnosis not present

## 2019-10-30 NOTE — Patient Instructions (Signed)
Medication Instructions:  Your physician recommends that you continue on your current medications as directed. Please refer to the Current Medication list given to you today.  *If you need a refill on your cardiac medications before your next appointment, please call your pharmacy*   Testing/Procedures: Your physician has requested that you have an exercise tolerance test. For further information please visit [email or web address]. Please also follow instruction sheet, as given.  Follow-Up: At [facility], you and your health needs are our priority.  As part of our continuing mission to provide you with exceptional heart care, we have created designated Provider Care Teams.  These Care Teams include your primary Cardiologist (physician) and Advanced Practice Providers (APPs -  Physician Assistants and Nurse Practitioners) who all work together to provide you with the care you need, when you need it.  Your next appointment:   1 year(s)  The format for your next appointment:   In Person  Provider:   You may see Traci Turner, MD or one of the following Advanced Practice Providers on your designated Care Team:    Dayna Dunn, PA-C  Michele Lenze, PA-C   

## 2019-10-30 NOTE — Addendum Note (Signed)
Addended by: Theresia Majors on: 10/30/2019 11:45 AM   Modules accepted: Orders

## 2019-10-30 NOTE — Progress Notes (Signed)
Cardiology Office Note:    Date:  10/30/2019   ID:  Clifford Frederick, DOB 22-Jan-1966, MRN [id number]  PCP:  Horald Pollen, MD  Cardiologist:  Fransico Him, MD    Referring MD: Horald Pollen, *   Chief Complaint  Patient presents with  . Coronary Artery Disease  . Hypertension  . Hyperlipidemia    History of Present Illness:    Clifford Frederick is a 54 y.o. male with a hx of type 2 DM, hyperlipidemia, HTN, tobacco use and  CAD.  He presented 08/2017 with complaints of sudden onset of SOB while smoking a cigarette at work. Trop was negative.  Cath showed  80% distal RCA and 25% mid to proximal left circumflex and underwent drug-eluting Synergy stent to the RCA.  He had recurrent CP after smoking a cigarette and nuclear stress test was normal.   He is here today for followup and is doing well.  He has has some problems with a pain over his right upper chest with no radiation and no associated sx.  It is nonexertional and he thinks it is related to MSK etiology from his work related activities.  He denies  SOB, DOE, PND, orthopnea, LE edema, dizziness, palpitations or syncope. He is compliant with his meds and is tolerating meds with no SE.    Past Medical History:  Diagnosis Date  . Abnormal EKG   . Acute coronary syndrome ([place]) 08/15/2017  . CAD in native artery    A. LHC 08/16/17- DES to RCA  . Chest pain 08/15/2017  . Diabetes mellitus type 2 in obese ([place]) 08/15/2017  . Essential hypertension, benign 08/08/2012  . Fracture of lateral malleolus of left ankle 08/07/2012  . High cholesterol   . Hypertensive urgency 08/15/2017  . Sleep apnea   . Tobacco abuse 08/15/2017  . Unstable angina [facility])     Past Surgical History:  Procedure Laterality Date  . CORONARY STENT INTERVENTION N/A 08/16/2017   Procedure: CORONARY STENT INTERVENTION;  Surgeon: Jettie Booze, MD;  Location: [facility];  Service: Cardiovascular;  Laterality: N/A;  . LEFT HEART CATH AND CORONARY  ANGIOGRAPHY N/A 08/16/2017   Procedure: LEFT HEART CATH AND CORONARY ANGIOGRAPHY;  Surgeon: Jettie Booze, MD;  Location: [facility];  Service: Cardiovascular;  Laterality: N/A;  . ORIF ANKLE FRACTURE Left 08/07/2012   Procedure: OPEN REDUCTION INTERNAL FIXATION (ORIF) LEFT ANKLE FRACTURE;  Surgeon: Mcarthur Rossetti, MD;  Location: [facility];  Service: Orthopedics;  Laterality: Left;    Current Medications: Current Meds  Medication Sig  . amLODipine (NORVASC) 5 MG tablet Take 1 tablet (5 mg total) by mouth daily. Please keep upcoming appt in May with Dr. Radford Pax before anymore refills. Thank you  . aspirin 81 MG chewable tablet Chew 1 tablet (81 mg total) by mouth daily.  Marland Kitchen atorvastatin (LIPITOR) 80 MG tablet TAKE 1 TABLET BY MOUTH ONCE DAILY AT 6 IN THE EVENING.  . blood glucose meter kit and supplies Dispense based on patient and insurance preference. Use up to four times daily as directed. (FOR ICD-10 E10.9, E11.9).  . carvedilol (COREG) 25 MG tablet Take 1 tablet (25 mg total) by mouth 2 (two) times daily with a meal. Please keep upcoming appt in May with Dr. Radford Pax before anymore refills. Thank you  . clopidogrel (PLAVIX) 75 MG tablet Take 1 tablet (75 mg total) by mouth daily. Please keep upcoming appt in May with Dr. Radford Pax before anymore refills.  Thank you  . Dulaglutide (TRULICITY) 6.33 HL/4.5GY SOPN Inject into the skin once a week.  Marland Kitchen JARDIANCE 10 MG TABS tablet Take 1 tablet by mouth once daily  . lisinopril (ZESTRIL) 40 MG tablet Take 1 tablet (40 mg total) by mouth daily.  . nitroGLYCERIN (NITROSTAT) 0.4 MG SL tablet Place 1 tablet (0.4 mg total) under the tongue every 5 (five) minutes as needed for chest pain.     Allergies:   Patient has no known allergies.   Social History   Socioeconomic History  . Marital status: Married    Spouse name: Not on file  . Number of children: Not on file  . Years of education: Not on file  . Highest education level: Not on  file  Occupational History  . Not on file  Tobacco Use  . Smoking status: Current Some Day Smoker    Packs/day: 0.25    Years: 20.00    Pack years: 5.00    Types: Cigarettes    Start date: 08/16/2017  . Smokeless tobacco: Never Used  . Tobacco comment: currently smokes 3 cigarettes a day  Substance and Sexual Activity  . Alcohol use: Yes    Alcohol/week: 6.0 standard drinks    Types: 6 Cans of beer per week    Comment: occ  . Drug use: No  . Sexual activity: Not on file  Other Topics Concern  . Not on file  Social History Narrative  . Not on file   Social Determinants of Health   Financial Resource Strain:   . Difficulty of Paying Living Expenses:   Food Insecurity:   . Worried About Charity fundraiser in the Last Year:   . Arboriculturist in the Last Year:   Transportation Needs:   . Film/video editor (Medical):   Marland Kitchen Lack of Transportation (Non-Medical):   Physical Activity:   . Days of Exercise per Week:   . Minutes of Exercise per Session:   Stress:   . Feeling of Stress :   Social Connections:   . Frequency of Communication with Friends and Family:   . Frequency of Social Gatherings with Friends and Family:   . Attends Religious Services:   . Active Member of Clubs or Organizations:   . Attends Archivist Meetings:   Marland Kitchen Marital Status:      Family History: The patient's family history includes Diabetes in his brother and mother; Hypertension in his brother and mother.  ROS:   Please see the history of present illness.    ROS  All other systems reviewed and negative.   EKGs/Labs/Other Studies Reviewed:    The following studies were reviewed today:  Nuclear stress test 2019  Nuclear stress EF: 50%.  Blood pressure demonstrated a hypertensive response to exercise.  Downsloping ST segment depression of 1 mm was noted during stress in the II, III and aVF leads, beginning at 9 minutes of stress. However, there is T wave inversion in these  leads at baseline which limits the specificity of this finding.  The study is normal.  This is a low risk study.  The left ventricular ejection fraction is mildly decreased (45-54%).  2D echo 08/2017 Study Conclusions   - Left ventricle: The cavity size was normal. There was moderate  concentric hypertrophy. Systolic function was normal. The  estimated ejection fraction was in the range of 60% to 65%. There  is hypokinesis of the apicalinferolateral myocardium. Features  are consistent with a  pseudonormal left ventricular filling  pattern, with concomitant abnormal relaxation and increased  filling pressure (grade 2 diastolic dysfunction). Doppler  parameters are consistent with high ventricular filling pressure.  - Aortic valve: Trileaflet; mildly thickened, mildly calcified  leaflets.  - Mitral valve: Calcified annulus. There was trivial regurgitation.  - Left atrium: The atrium was mildly dilated.  - Pulmonary arteries: Systolic pressure could not be accurately  estimated.   Cardiac Cath 08/2017 Conclusion    Dist RCA lesion is 80% stenosed.  Prox Cx to Mid Cx lesion is 25% stenosed.  There is mild to moderate left ventricular systolic dysfunction.  There is no aortic valve stenosis.  A drug-eluting stent was successfully placed using a STENT SYNERGY DES 3X16.  Post intervention, there is a 0% residual stenosis.   Continue dual antiplatelet therapy with aspirin and Brilinta for 1 year along with aggressive secondary prevention.      Diagnostic Dominance: Right  Intervention        EKG:  EKG is not ordered today.   Recent Labs: 10/14/2019: ALT 24; BUN 13; Creatinine, Ser 0.90; Hemoglobin 13.4; Platelets 205; Potassium 4.6; Sodium 142   Recent Lipid Panel    Component Value Date/Time   CHOL 125 10/14/2019 1145   TRIG 79 10/14/2019 1145   HDL 45 10/14/2019 1145   CHOLHDL 2.8 10/14/2019 1145   CHOLHDL 5.5 08/16/2017 0344   VLDL 57  (H) 08/16/2017 0344   LDLCALC 64 10/14/2019 1145    Physical Exam:    VS:  BP (!) 142/92   Pulse 94   Ht '5\' 10"'$  (1.778 m)   Wt 229 lb (103.9 kg)   SpO2 98%   BMI 32.86 kg/m     Wt Readings from Last 3 Encounters:  10/30/19 229 lb (103.9 kg)  10/14/19 227 lb (103 kg)  07/17/19 245 lb (111.1 kg)    GEN: Well nourished, well developed in no acute distress HEENT: Normal NECK: No JVD; No carotid bruits LYMPHATICS: No lymphadenopathy CARDIAC:RRR, no murmurs, rubs, gallops RESPIRATORY:  Clear to auscultation without rales, wheezing or rhonchi  ABDOMEN: Soft, non-tender, non-distended MUSCULOSKELETAL:  No edema; No deformity  SKIN: Warm and dry NEUROLOGIC:  Alert and oriented x 3 PSYCHIATRIC:  Normal affect    ASSESSMENT:    1. CAD in native artery   2. Pure hypercholesterolemia   3. Essential hypertension, benign    PLAN:    In order of problems listed above:  1.  ASCAD  - cath showed  80% distal RCA and 25% mid to proximal left circumflex.   -He is status post drug-eluting Synergy stent to the RCA 08/16/2017.   -he has had some atypical CP that I suspect is MSK but will get an ETT to rule out ischemia.  -He will continue on ASA '81mg'$  daily, Brilinta '90mg'$  BID, BB and statin.   2. Hyperlipidemia  - LDL goal < 70 -LDL was 64 earlier this month - Continue Atorvastatin '80mg'$  daily.    3.  HTN  -BP controlled on exam -continue amlodipine '5mg'$  daily, Lisinopril '40mg'$  daily and Carvedilol '25mg'$  BID    Medication Adjustments/Labs and Tests Ordered: Current medicines are reviewed at length with the patient today.  Concerns regarding medicines are outlined above.  No orders of the defined types were placed in this encounter.  No orders of the defined types were placed in this encounter.   Signed, Fransico Him, MD  10/30/2019 11:34 AM    [place]

## 2019-11-08 ENCOUNTER — Other Ambulatory Visit (HOSPITAL_COMMUNITY)
Admission: RE | Admit: 2019-11-08 | Discharge: 2019-11-08 | Disposition: A | Discharge status: Home / Self Care | Payer: No Typology Code available for payment source | Source: Ambulatory Visit | Attending: Cardiology | Admitting: Cardiology

## 2019-11-08 DIAGNOSIS — Z01812 Encounter for preprocedural laboratory examination: Secondary | ICD-10-CM | POA: Diagnosis present

## 2019-11-08 DIAGNOSIS — Z20822 Contact with and (suspected) exposure to covid-19: Secondary | ICD-10-CM | POA: Diagnosis not present

## 2019-11-08 LAB — SARS CORONAVIRUS 2 (TAT 6-24 HRS): SARS Coronavirus 2: NEGATIVE

## 2019-11-12 ENCOUNTER — Other Ambulatory Visit: Payer: Self-pay | Admitting: Emergency Medicine

## 2019-11-12 ENCOUNTER — Ambulatory Visit (INDEPENDENT_AMBULATORY_CARE_PROVIDER_SITE_OTHER): Payer: No Typology Code available for payment source

## 2019-11-12 ENCOUNTER — Other Ambulatory Visit: Payer: Self-pay | Admitting: Cardiology

## 2019-11-12 ENCOUNTER — Other Ambulatory Visit: Payer: Self-pay

## 2019-11-12 DIAGNOSIS — I2510 Atherosclerotic heart disease of native coronary artery without angina pectoris: Secondary | ICD-10-CM | POA: Diagnosis not present

## 2019-11-12 DIAGNOSIS — R072 Precordial pain: Secondary | ICD-10-CM | POA: Diagnosis not present

## 2019-11-12 DIAGNOSIS — I10 Essential (primary) hypertension: Secondary | ICD-10-CM

## 2019-11-12 LAB — EXERCISE TOLERANCE TEST
Estimated workload: 9.1 METS
Exercise duration (min): 6 min
Exercise duration (sec): 41 s
MPHR: 165 {beats}/min
Peak HR: 142 {beats}/min
Percent HR: 85 %
RPE: 17
Rest HR: 91 {beats}/min

## 2019-11-12 NOTE — Telephone Encounter (Signed)
Requested Prescriptions  Pending Prescriptions Disp Refills  . lisinopril (ZESTRIL) 40 MG tablet [Pharmacy Med Name: Lisinopril 40 MG Oral Tablet] 90 tablet 1    Sig: Take 1 tablet by mouth once daily     Cardiovascular:  ACE Inhibitors Failed - 11/12/2019  3:20 PM      Failed - Last BP in normal range    BP Readings from Last 1 Encounters:  10/30/19 (!) 142/92         Passed - Cr in normal range and within 180 days    Creatinine, Ser  Date Value Ref Range Status  10/14/2019 0.90 0.76 - 1.27 mg/dL Final         Passed - K in normal range and within 180 days    Potassium  Date Value Ref Range Status  10/14/2019 4.6 3.5 - 5.2 mmol/L Final         Passed - Patient is not pregnant      Passed - Valid encounter within last 6 months    Recent Outpatient Visits          4 weeks ago Dyslipidemia associated with type 2 diabetes mellitus ([place])   Primary Care at [place], [place], MD   3 months ago Hypertension associated with type 2 diabetes mellitus [facility])   Primary Care at [facility], Ines Bloomer, MD   6 months ago    Primary Care at [facility], [place], MD   6 months ago COVID-19 virus infection   Primary Care at [place], [place], MD   10 months ago Hypertension associated with type 2 diabetes mellitus [facility])   Primary Care at [facility], Ines Bloomer, MD      Future Appointments            In 2 months Sag[place], Ines Bloomer, MD Primary Care at [place], [facility]           . atorvastatin (LIPITOR) 80 MG tablet [Pharmacy Med Name: Atorvastatin Calcium 80 MG Oral Tablet] 90 tablet 3    Sig: TAKE 1 TABLET BY MOUTH ONCE DAILY AT  6PM  IN  THE  EVENING     Cardiovascular:  Antilipid - Statins Passed - 11/12/2019  3:20 PM      Passed - Total Cholesterol in normal range and within 360 days    Cholesterol, Total  Date Value Ref Range Status  10/14/2019 125 100 - 199 mg/dL Final         Passed - LDL in normal range and within 360 days    LDL  Chol Calc (NIH)  Date Value Ref Range Status  10/14/2019 64 0 - 99 mg/dL Final         Passed - HDL in normal range and within 360 days    HDL  Date Value Ref Range Status  10/14/2019 45 >39 mg/dL Final         Passed - Triglycerides in normal range and within 360 days    Triglycerides  Date Value Ref Range Status  10/14/2019 79 0 - 149 mg/dL Final         Passed - Patient is not pregnant      Passed - Valid encounter within last 12 months    Recent Outpatient Visits          4 weeks ago Dyslipidemia associated with type 2 diabetes mellitus [facility])   Primary Care at [facility], Ines Bloomer, MD   3 months ago Hypertension associated with  type 2 diabetes mellitus [facility])   Primary Care at [facility], Eilleen Kempf, MD   6 months ago    Primary Care at [facility], Eilleen Kempf, MD   6 months ago COVID-19 virus infection   Primary Care at [facility], [place], MD   10 months ago Hypertension associated with type 2 diabetes mellitus [facility])   Primary Care at [facility], Eilleen Kempf, MD      Future Appointments            In 2 months Sagardia, Eilleen Kempf, MD Primary Care at [place], [facility]

## 2019-11-27 ENCOUNTER — Other Ambulatory Visit: Payer: Self-pay

## 2019-11-27 MED ORDER — CLOPIDOGREL BISULFATE 75 MG PO TABS: 75.0000 mg | ORAL_TABLET | Freq: Every day | ORAL | 3 refills | Status: DC

## 2019-11-27 MED ORDER — CARVEDILOL 25 MG PO TABS: 25.0000 mg | ORAL_TABLET | Freq: Two times a day (BID) | ORAL | 3 refills | Status: DC

## 2020-01-13 ENCOUNTER — Encounter: Payer: Self-pay | Admitting: Emergency Medicine

## 2020-01-13 ENCOUNTER — Ambulatory Visit: Payer: No Typology Code available for payment source | Admitting: Emergency Medicine

## 2020-01-13 ENCOUNTER — Other Ambulatory Visit: Payer: Self-pay

## 2020-01-13 VITALS — BP 127/80 | HR 90 | Temp 97.0°F | Resp 16 | Ht 70.0 in | Wt 226.0 lb

## 2020-01-13 DIAGNOSIS — E785 Hyperlipidemia, unspecified: Secondary | ICD-10-CM

## 2020-01-13 DIAGNOSIS — G4733 Obstructive sleep apnea (adult) (pediatric): Secondary | ICD-10-CM | POA: Diagnosis not present

## 2020-01-13 DIAGNOSIS — F17209 Nicotine dependence, unspecified, with unspecified nicotine-induced disorders: Secondary | ICD-10-CM

## 2020-01-13 DIAGNOSIS — N529 Male erectile dysfunction, unspecified: Secondary | ICD-10-CM

## 2020-01-13 DIAGNOSIS — I10 Essential (primary) hypertension: Secondary | ICD-10-CM | POA: Diagnosis not present

## 2020-01-13 DIAGNOSIS — E1159 Type 2 diabetes mellitus with other circulatory complications: Secondary | ICD-10-CM

## 2020-01-13 DIAGNOSIS — E1169 Type 2 diabetes mellitus with other specified complication: Secondary | ICD-10-CM | POA: Diagnosis not present

## 2020-01-13 DIAGNOSIS — I152 Hypertension secondary to endocrine disorders: Secondary | ICD-10-CM

## 2020-01-13 DIAGNOSIS — Z716 Tobacco abuse counseling: Secondary | ICD-10-CM

## 2020-01-13 LAB — POCT GLYCOSYLATED HEMOGLOBIN (HGB A1C): Hemoglobin A1C: 6.3 % — AB (ref 4.0–5.6)

## 2020-01-13 LAB — GLUCOSE, POCT (MANUAL RESULT ENTRY): POC Glucose: 94 mg/dl (ref 70–99)

## 2020-01-13 MED ORDER — SILDENAFIL CITRATE 100 MG PO TABS: 50.0000 mg | ORAL_TABLET | Freq: Every day | ORAL | 11 refills | Status: DC | PRN

## 2020-01-13 MED ORDER — TRULICITY 0.75 MG/0.5ML ~~LOC~~ SOAJ: 0.7500 mg | SUBCUTANEOUS | 3 refills | Status: DC

## 2020-01-13 NOTE — Patient Instructions (Addendum)
If you have lab work done today you will be contacted with your lab results within the next 2 weeks.  If you have not heard from [place] then please contact us. The fastest way to get your results is to register for My Chart.   IF you received an x-ray today, you will receive an invoice from [facility]. Please contact [facility] at [phone] with questions or concerns regarding your invoice.   IF you received labwork today, you will receive an invoice from [facility]. Please contact [facility] at [phone] with questions or concerns regarding your invoice.   Our billing staff will not be able to assist you with questions regarding bills from these companies.  You will be contacted with the lab results as soon as they are available. The fastest way to get your results is to activate your My Chart account. Instructions are located on the last page of this paperwork. If you have not heard from [place] regarding the results in 2 weeks, please contact this office.     Steps to Quit Smoking Smoking tobacco is the leading cause of preventable death. It can affect almost every organ in the body. Smoking puts you and people around you at risk for many serious, long-lasting (chronic) diseases. Quitting smoking can be hard, but it is one of the best things that you can do for your health. It is never too late to quit. How do I get ready to quit? When you decide to quit smoking, make a plan to help you succeed. Before you quit:  Pick a date to quit. Set a date within the next 2 weeks to give you time to prepare.  Write down the reasons why you are quitting. Keep this list in places where you will see it often.  Tell your family, friends, and co-workers that you are quitting. Their support is important.  Talk with your doctor about the choices that may help you quit.  Find out if your health insurance will pay for these treatments.  Know the people, places, things, and activities  that make you want to smoke (triggers). Avoid them. What first steps can I take to quit smoking?  Throw away all cigarettes at home, at work, and in your car.  Throw away the things that you use when you smoke, such as ashtrays and lighters.  Clean your car. Make sure to empty the ashtray.  Clean your home, including curtains and carpets. What can I do to help me quit smoking? Talk with your doctor about taking medicines and seeing a counselor at the same time. You are more likely to succeed when you do both.  If you are pregnant or breastfeeding, talk with your doctor about counseling or other ways to quit smoking. Do not take medicine to help you quit smoking unless your doctor tells you to do so. To quit smoking: Quit right away  Quit smoking totally, instead of slowly cutting back on how much you smoke over a period of time.  Go to counseling. You are more likely to quit if you go to counseling sessions regularly. Take medicine You may take medicines to help you quit. Some medicines need a prescription, and some you can buy over-the-counter. Some medicines may contain a drug called nicotine to replace the nicotine in cigarettes. Medicines may:  Help you to stop having the desire to smoke (cravings).  Help to stop the problems that come when you stop smoking (withdrawal symptoms). Your doctor may  ask you to use:  Nicotine patches, gum, or lozenges.  Nicotine inhalers or sprays.  Non-nicotine medicine that is taken by mouth. Find resources Find resources and other ways to help you quit smoking and remain smoke-free after you quit. These resources are most helpful when you use them often. They include:  Online chats with a Social worker.  Phone quitlines.  Printed Furniture conservator/restorer.  Support groups or group counseling.  Text messaging programs.  Mobile phone apps. Use apps on your mobile phone or tablet that can help you stick to your quit plan. There are many free apps  for mobile phones and tablets as well as websites. Examples include Quit Guide from the [employer] and smokefree.gov  What things can I do to make it easier to quit?   Talk to your family and friends. Ask them to support and encourage you.  Call a phone quitline (1-800-QUIT-NOW), reach out to support groups, or work with a Social worker.  Ask people who smoke to not smoke around you.  Avoid places that make you want to smoke, such as: ? Bars. ? Parties. ? Smoke-break areas at work.  Spend time with people who do not smoke.  Lower the stress in your life. Stress can make you want to smoke. Try these things to help your stress: ? Getting regular exercise. ? Doing deep-breathing exercises. ? Doing yoga. ? Meditating. ? Doing a body scan. To do this, close your eyes, focus on one area of your body at a time from head to toe. Notice which parts of your body are tense. Try to relax the muscles in those areas. How will I feel when I quit smoking? Day 1 to 3 weeks Within the first 24 hours, you may start to have some problems that come from quitting tobacco. These problems are very bad 2-3 days after you quit, but they do not often last for more than 2-3 weeks. You may get these symptoms:  Mood swings.  Feeling restless, nervous, angry, or annoyed.  Trouble concentrating.  Dizziness.  Strong desire for high-sugar foods and nicotine.  Weight gain.  Trouble pooping (constipation).  Feeling like you may vomit (nausea).  Coughing or a sore throat.  Changes in how the medicines that you take for other issues work in your body.  Depression.  Trouble sleeping (insomnia). Week 3 and afterward After the first 2-3 weeks of quitting, you may start to notice more positive results, such as:  Better sense of smell and taste.  Less coughing and sore throat.  Slower heart rate.  Lower blood pressure.  Clearer skin.  Better breathing.  Fewer sick days. Quitting smoking can be hard. Do  not give up if you fail the first time. Some people need to try a few times before they succeed. Do your best to stick to your quit plan, and talk with your doctor if you have any questions or concerns. Summary  Smoking tobacco is the leading cause of preventable death. Quitting smoking can be hard, but it is one of the best things that you can do for your health.  When you decide to quit smoking, make a plan to help you succeed.  Quit smoking right away, not slowly over a period of time.  When you start quitting, seek help from your doctor, family, or friends. This information is not intended to replace advice given to you by your health care provider. Make sure you discuss any questions you have with your health care provider. Document Revised:  02/15/2019 Document Reviewed: 08/11/2018 Elsevier Patient Education  2020 [employer].  Diabetes Mellitus and Nutrition, Adult When you have diabetes (diabetes mellitus), it is very important to have healthy eating habits because your blood sugar (glucose) levels are greatly affected by what you eat and drink. Eating healthy foods in the appropriate amounts, at about the same times every day, can help you:  Control your blood glucose.  Lower your risk of heart disease.  Improve your blood pressure.  Reach or maintain a healthy weight. Every person with diabetes is different, and each person has different needs for a meal plan. Your health care provider may recommend that you work with a diet and nutrition specialist (dietitian) to make a meal plan that is best for you. Your meal plan may vary depending on factors such as:  The calories you need.  The medicines you take.  Your weight.  Your blood glucose, blood pressure, and cholesterol levels.  Your activity level.  Other health conditions you have, such as heart or kidney disease. How do carbohydrates affect me? Carbohydrates, also called carbs, affect your blood glucose level more  than any other type of food. Eating carbs naturally raises the amount of glucose in your blood. Carb counting is a method for keeping track of how many carbs you eat. Counting carbs is important to keep your blood glucose at a healthy level, especially if you use insulin or take certain oral diabetes medicines. It is important to know how many carbs you can safely have in each meal. This is different for every person. Your dietitian can help you calculate how many carbs you should have at each meal and for each snack. Foods that contain carbs include:  Bread, cereal, rice, pasta, and crackers.  Potatoes and corn.  Peas, beans, and lentils.  Milk and yogurt.  Fruit and juice.  Desserts, such as cakes, cookies, ice cream, and candy. How does alcohol affect me? Alcohol can cause a sudden decrease in blood glucose (hypoglycemia), especially if you use insulin or take certain oral diabetes medicines. Hypoglycemia can be a life-threatening condition. Symptoms of hypoglycemia (sleepiness, dizziness, and confusion) are similar to symptoms of having too much alcohol. If your health care provider says that alcohol is safe for you, follow these guidelines:  Limit alcohol intake to no more than 1 drink per day for nonpregnant women and 2 drinks per day for men. One drink equals 12 oz of beer, 5 oz of wine, or 1 oz of hard liquor.  Do not drink on an empty stomach.  Keep yourself hydrated with water, diet soda, or unsweetened iced tea.  Keep in mind that regular soda, juice, and other mixers may contain a lot of sugar and must be counted as carbs. What are tips for following this plan?  Reading food labels  Start by checking the serving size on the "Nutrition Facts" label of packaged foods and drinks. The amount of calories, carbs, fats, and other nutrients listed on the label is based on one serving of the item. Many items contain more than one serving per package.  Check the total grams (g) of  carbs in one serving. You can calculate the number of servings of carbs in one serving by dividing the total carbs by 15. For example, if a food has 30 g of total carbs, it would be equal to 2 servings of carbs.  Check the number of grams (g) of saturated and trans fats in one serving. Choose foods that  have low or no amount of these fats.  Check the number of milligrams (mg) of salt (sodium) in one serving. Most people should limit total sodium intake to less than 2,300 mg per day.  Always check the nutrition information of foods labeled as "low-fat" or "nonfat". These foods may be higher in added sugar or refined carbs and should be avoided.  Talk to your dietitian to identify your daily goals for nutrients listed on the label. Shopping  Avoid buying canned, premade, or processed foods. These foods tend to be high in fat, sodium, and added sugar.  Shop around the outside edge of the grocery store. This includes fresh fruits and vegetables, bulk grains, fresh meats, and fresh dairy. Cooking  Use low-heat cooking methods, such as baking, instead of high-heat cooking methods like deep frying.  Cook using healthy oils, such as olive, canola, or sunflower oil.  Avoid cooking with butter, cream, or high-fat meats. Meal planning  Eat meals and snacks regularly, preferably at the same times every day. Avoid going long periods of time without eating.  Eat foods high in fiber, such as fresh fruits, vegetables, beans, and whole grains. Talk to your dietitian about how many servings of carbs you can eat at each meal.  Eat 4-6 ounces (oz) of lean protein each day, such as lean meat, chicken, fish, eggs, or tofu. One oz of lean protein is equal to: ? 1 oz of meat, chicken, or fish. ? 1 egg. ?  cup of tofu.  Eat some foods each day that contain healthy fats, such as avocado, nuts, seeds, and fish. Lifestyle  Check your blood glucose regularly.  Exercise regularly as told by your health care  provider. This may include: ? 150 minutes of moderate-intensity or vigorous-intensity exercise each week. This could be brisk walking, biking, or water aerobics. ? Stretching and doing strength exercises, such as yoga or weightlifting, at least 2 times a week.  Take medicines as told by your health care provider.  Do not use any products that contain nicotine or tobacco, such as cigarettes and e-cigarettes. If you need help quitting, ask your health care provider.  Work with a Veterinary surgeon or diabetes educator to identify strategies to manage stress and any emotional and social challenges. Questions to ask a health care provider  Do I need to meet with a diabetes educator?  Do I need to meet with a dietitian?  What number can I call if I have questions?  When are the best times to check my blood glucose? Where to find more information:  American Diabetes Association: diabetes.org  Academy of Nutrition and Dietetics: www.eatright.[employer] of Diabetes and Digestive and Kidney Diseases (NIH): CarFli[employer].tn Summary  A healthy meal plan will help you control your blood glucose and maintain a healthy lifestyle.  Working with a diet and nutrition specialist (dietitian) can help you make a meal plan that is best for you.  Keep in mind that carbohydrates (carbs) and alcohol have immediate effects on your blood glucose levels. It is important to count carbs and to use alcohol carefully. This information is not intended to replace advice given to you by your health care provider. Make sure you discuss any questions you have with your health care provider. Document Revised: 05/05/2017 Document Reviewed: 06/27/2016 Elsevier Patient Education  2020 [employer].

## 2020-01-13 NOTE — Assessment & Plan Note (Signed)
Well-controlled hypertension. Continue present medication. No changes. Well-controlled diabetes with hemoglobin A1c of 6.3. Continue present medications. No changes. Diet and nutrition discussed. Follow-up in 6 months.

## 2020-01-13 NOTE — Progress Notes (Signed)
Clifford Frederick 54 y.o.   Chief Complaint  Patient presents with  . Diabetes    follow up 3 month  . Hypertension    HISTORY OF PRESENT ILLNESS: This is a 54 y.o. male with history of diabetes, hypertension, and dyslipidemia, here for follow-up and medication refill. Assessment from last office visit on May 2021 as follows: ASSESSMENT & PLAN: Hypertension associated with type 2 diabetes mellitus ([place]) Blood pressure well controlled.  Continue present medication.  No changes. Diabetes much better with hemoglobin A1c of 6.2. Continue statin therapy and daily aspirin.  Diet and nutrition discussed. Follow-up in 3 months.  Compliant with medications.  Doing well. Complaining of erectile dysfunction.  Inquiring about Viagra.  Not taking nitroglycerin.  No need for it according to him. No other complaints or medical concerns today. Fully vaccinated against Covid. Needs refill on Trulicity.  HPI   Prior to Admission medications   Medication Sig Start Date End Date Taking? Authorizing Provider  amLODipine (NORVASC) 5 MG tablet TAKE 1 TABLET BY MOUTH ONCE DAILY -  PLEASE  KEEP  UPCOMING  APPT  IN  MAY  WITH  DR.  Radford Pax  BEFORE  ANY  MORE  REFILLS 11/13/19  Yes Turner, Eber Hong, MD  aspirin 81 MG chewable tablet Chew 1 tablet (81 mg total) by mouth daily. 08/18/17  Yes Daune Perch, NP  atorvastatin (LIPITOR) 80 MG tablet TAKE 1 TABLET BY MOUTH ONCE DAILY AT  6PM  IN  THE  EVENING 11/12/19  Yes Farhana Fellows, Ines Bloomer, MD  carvedilol (COREG) 25 MG tablet Take 1 tablet (25 mg total) by mouth 2 (two) times daily with a meal. 11/27/19  Yes Turner, Eber Hong, MD  clopidogrel (PLAVIX) 75 MG tablet Take 1 tablet (75 mg total) by mouth daily. 11/27/19  Yes Turner, Eber Hong, MD  Dulaglutide (TRULICITY) 3.50 KX/3.8HW SOPN Inject into the skin once a week.   Yes [provider]  JARDIANCE 10 MG TABS tablet Take 1 tablet by mouth once daily 06/24/19  Yes Rutherford Guys, MD  lisinopril (ZESTRIL) 40  MG tablet Take 1 tablet by mouth once daily 11/12/19  Yes Khamora Karan, Ines Bloomer, MD  nitroGLYCERIN (NITROSTAT) 0.4 MG SL tablet Place 1 tablet (0.4 mg total) under the tongue every 5 (five) minutes as needed for chest pain. 08/17/17  Yes Daune Perch, NP  blood glucose meter kit and supplies Dispense based on patient and insurance preference. Use up to four times daily as directed. (FOR ICD-10 E10.9, E11.9). 06/04/18   Posey Boyer, MD  metFORMIN (GLUCOPHAGE) 1000 MG tablet Take 1 tablet (1,000 mg total) by mouth 2 (two) times daily with a meal. 07/17/19 10/15/19  Horald Pollen, MD    No Known Allergies  Patient Active Problem List   Diagnosis Date Noted  . Hypertension associated with type 2 diabetes mellitus ([place]) 07/17/2019  . CAD in native artery   . Pure hypercholesterolemia   . Tobacco abuse 08/15/2017  . Dyslipidemia associated with type 2 diabetes mellitus ([place]) 08/15/2017  . Normocytic anemia 08/15/2017  . Essential hypertension, benign 08/08/2012    Past Medical History:  Diagnosis Date  . Abnormal EKG   . Acute coronary syndrome ([place]) 08/15/2017  . CAD in native artery    A. LHC 08/16/17- DES to RCA  . Chest pain 08/15/2017  . Diabetes mellitus type 2 in obese ([place]) 08/15/2017  . Essential hypertension, benign 08/08/2012  . Fracture of lateral malleolus of left ankle 08/07/2012  .  High cholesterol   . Hypertensive urgency 08/15/2017  . Sleep apnea   . Tobacco abuse 08/15/2017  . Unstable angina [facility])     Past Surgical History:  Procedure Laterality Date  . CORONARY STENT INTERVENTION N/A 08/16/2017   Procedure: CORONARY STENT INTERVENTION;  Surgeon: Jettie Booze, MD;  Location: [place] [facility];  Service: Cardiovascular;  Laterality: N/A;  . LEFT HEART CATH AND CORONARY ANGIOGRAPHY N/A 08/16/2017   Procedure: LEFT HEART CATH AND CORONARY ANGIOGRAPHY;  Surgeon: Jettie Booze, MD;  Location: [place] [facility];  Service: Cardiovascular;  Laterality:  N/A;  . ORIF ANKLE FRACTURE Left 08/07/2012   Procedure: OPEN REDUCTION INTERNAL FIXATION (ORIF) LEFT ANKLE FRACTURE;  Surgeon: Mcarthur Rossetti, MD;  Location: [place];  Service: Orthopedics;  Laterality: Left;    Social History   Socioeconomic History  . Marital status: Married    Spouse name: Not on file  . Number of children: Not on file  . Years of education: Not on file  . Highest education level: Not on file  Occupational History  . Not on file  Tobacco Use  . Smoking status: Current Some Day Smoker    Packs/day: 0.25    Years: 20.00    Pack years: 5.00    Types: Cigarettes    Start date: 08/16/2017  . Smokeless tobacco: Never Used  . Tobacco comment: currently smokes 3 cigarettes a day  Vaping Use  . Vaping Use: Never used  Substance and Sexual Activity  . Alcohol use: Yes    Alcohol/week: 6.0 standard drinks    Types: 6 Cans of beer per week    Comment: occ  . Drug use: No  . Sexual activity: Not on file  Other Topics Concern  . Not on file  Social History Narrative  . Not on file   Social Determinants of Health   Financial Resource Strain:   . Difficulty of Paying Living Expenses:   Food Insecurity:   . Worried About Charity fundraiser in the Last Year:   . Arboriculturist in the Last Year:   Transportation Needs:   . Film/video editor (Medical):   Marland Kitchen Lack of Transportation (Non-Medical):   Physical Activity:   . Days of Exercise per Week:   . Minutes of Exercise per Session:   Stress:   . Feeling of Stress :   Social Connections:   . Frequency of Communication with Friends and Family:   . Frequency of Social Gatherings with Friends and Family:   . Attends Religious Services:   . Active Member of Clubs or Organizations:   . Attends Archivist Meetings:   Marland Kitchen Marital Status:   Intimate Partner Violence:   . Fear of Current or Ex-Partner:   . Emotionally Abused:   Marland Kitchen Physically Abused:   . Sexually Abused:     Family History    Problem Relation Age of Onset  . Diabetes Mother   . Hypertension Mother   . Diabetes Brother   . Hypertension Brother      Review of Systems  Constitutional: Negative.  Negative for chills and fever.  HENT: Negative.  Negative for congestion and sore throat.   Respiratory: Negative.  Negative for cough and shortness of breath.   Cardiovascular: Negative.  Negative for chest pain and palpitations.  Gastrointestinal: Negative.  Negative for abdominal pain, diarrhea, nausea and vomiting.  Genitourinary: Negative.  Negative for dysuria and hematuria.  Musculoskeletal: Negative.  Skin: Negative.  Negative for rash.  Neurological: Negative.  Negative for dizziness and headaches.  All other systems reviewed and are negative.  Today's Vitals   01/13/20 0926  BP: 127/80  Pulse: 90  Resp: 16  Temp: (!) 97 F (36.1 C)  TempSrc: Temporal  SpO2: 97%  Weight: 226 lb (102.5 kg)  Height: '5\' 10"'$  (1.778 m)   Body mass index is 32.43 kg/m.   Physical Exam Vitals reviewed.  Constitutional:      Appearance: Normal appearance.  HENT:     Head: Normocephalic.  Eyes:     Extraocular Movements: Extraocular movements intact.     Conjunctiva/sclera: Conjunctivae normal.     Pupils: Pupils are equal, round, and reactive to light.  Cardiovascular:     Rate and Rhythm: Normal rate and regular rhythm.     Pulses: Normal pulses.     Heart sounds: Normal heart sounds.  Pulmonary:     Effort: Pulmonary effort is normal.     Breath sounds: Normal breath sounds.  Musculoskeletal:        General: Normal range of motion.     Cervical back: Normal range of motion and neck supple.  Skin:    General: Skin is warm and dry.     Capillary Refill: Capillary refill takes less than 2 seconds.  Neurological:     General: No focal deficit present.     Mental Status: He is alert and oriented to person, place, and time.  Psychiatric:        Mood and Affect: Mood normal.        Behavior: Behavior  normal.      Results for orders placed or performed in visit on 01/13/20 (from the past 24 hour(s))  POCT glucose (manual entry)     Status: None   Collection Time: 01/13/20 10:10 AM  Result Value Ref Range   POC Glucose 94 70 - 99 mg/dl  POCT glycosylated hemoglobin (Hb A1C)     Status: Abnormal   Collection Time: 01/13/20 10:19 AM  Result Value Ref Range   Hemoglobin A1C 6.3 (A) 4.0 - 5.6 %   HbA1c POC (<> result, manual entry)     HbA1c, POC (prediabetic range)     HbA1c, POC (controlled diabetic range)     A total of 30 minutes was spent with the patient, greater than 50% of which was in counseling/coordination of care regarding diabetes, hypertension, and dyslipidemia, cardiovascular risks associated with these conditions, treatment and management, review of all medications, review of most recent blood work results including today's hemoglobin A1c, review of most recent office visit notes, diet and nutrition, smoking and risk associated with this, smoking cessation, prognosis and need for follow-up.   ASSESSMENT & PLAN: Hypertension associated with type 2 diabetes mellitus ([place]) Well-controlled hypertension. Continue present medication. No changes. Well-controlled diabetes with hemoglobin A1c of 6.3. Continue present medications. No changes. Diet and nutrition discussed. Follow-up in 6 months.  Clifford Frederick was seen today for diabetes and hypertension.  Diagnoses and all orders for this visit:  Hypertension associated with type 2 diabetes mellitus ([place]) -     Comprehensive metabolic panel -     Ambulatory referral to Ophthalmology -     POCT glucose (manual entry) -     POCT glycosylated hemoglobin (Hb A1C) -     Dulaglutide (TRULICITY) 6.16 WV/3.7TG SOPN; Inject 0.5 mLs (0.75 mg total) into the skin once a week.  Dyslipidemia associated with type 2 diabetes mellitus ([place]) -  Lipid panel  OSA (obstructive sleep apnea)  Erectile dysfunction, unspecified erectile dysfunction  type -     sildenafil (VIAGRA) 100 MG tablet; Take 0.5-1 tablets (50-100 mg total) by mouth daily as needed for erectile dysfunction.  Tobacco use disorder, continuous  Tobacco abuse counseling    Patient Instructions       If you have lab work done today you will be contacted with your lab results within the next 2 weeks.  If you have not heard from [place] then please contact us. The fastest way to get your results is to register for My Chart.   IF you received an x-ray today, you will receive an invoice from [facility]. Please contact [facility] at [phone] with questions or concerns regarding your invoice.   IF you received labwork today, you will receive an invoice from [place]. Please contact [facility] at [phone] with questions or concerns regarding your invoice.   Our billing staff will not be able to assist you with questions regarding bills from these companies.  You will be contacted with the lab results as soon as they are available. The fastest way to get your results is to activate your My Chart account. Instructions are located on the last page of this paperwork. If you have not heard from [place] regarding the results in 2 weeks, please contact this office.     Steps to Quit Smoking Smoking tobacco is the leading cause of preventable death. It can affect almost every organ in the body. Smoking puts you and people around you at risk for many serious, long-lasting (chronic) diseases. Quitting smoking can be hard, but it is one of the best things that you can do for your health. It is never too late to quit. How do I get ready to quit? When you decide to quit smoking, make a plan to help you succeed. Before you quit:  Pick a date to quit. Set a date within the next 2 weeks to give you time to prepare.  Write down the reasons why you are quitting. Keep this list in places where you will see it often.  Tell your family, friends, and co-workers that  you are quitting. Their support is important.  Talk with your doctor about the choices that may help you quit.  Find out if your health insurance will pay for these treatments.  Know the people, places, things, and activities that make you want to smoke (triggers). Avoid them. What first steps can I take to quit smoking?  Throw away all cigarettes at home, at work, and in your car.  Throw away the things that you use when you smoke, such as ashtrays and lighters.  Clean your car. Make sure to empty the ashtray.  Clean your home, including curtains and carpets. What can I do to help me quit smoking? Talk with your doctor about taking medicines and seeing a counselor at the same time. You are more likely to succeed when you do both.  If you are pregnant or breastfeeding, talk with your doctor about counseling or other ways to quit smoking. Do not take medicine to help you quit smoking unless your doctor tells you to do so. To quit smoking: Quit right away  Quit smoking totally, instead of slowly cutting back on how much you smoke over a period of time.  Go to counseling. You are more likely to quit if you go to counseling sessions regularly. Take medicine You may take medicines to help you  quit. Some medicines need a prescription, and some you can buy over-the-counter. Some medicines may contain a drug called nicotine to replace the nicotine in cigarettes. Medicines may:  Help you to stop having the desire to smoke (cravings).  Help to stop the problems that come when you stop smoking (withdrawal symptoms). Your doctor may ask you to use:  Nicotine patches, gum, or lozenges.  Nicotine inhalers or sprays.  Non-nicotine medicine that is taken by mouth. Find resources Find resources and other ways to help you quit smoking and remain smoke-free after you quit. These resources are most helpful when you use them often. They include:  Online chats with a Social worker.  Phone  quitlines.  Printed Furniture conservator/restorer.  Support groups or group counseling.  Text messaging programs.  Mobile phone apps. Use apps on your mobile phone or tablet that can help you stick to your quit plan. There are many free apps for mobile phones and tablets as well as websites. Examples include Quit Guide from the [employer] and smokefree.gov  What things can I do to make it easier to quit?   Talk to your family and friends. Ask them to support and encourage you.  Call a phone quitline (1-800-QUIT-NOW), reach out to support groups, or work with a Social worker.  Ask people who smoke to not smoke around you.  Avoid places that make you want to smoke, such as: ? Bars. ? Parties. ? Smoke-break areas at work.  Spend time with people who do not smoke.  Lower the stress in your life. Stress can make you want to smoke. Try these things to help your stress: ? Getting regular exercise. ? Doing deep-breathing exercises. ? Doing yoga. ? Meditating. ? Doing a body scan. To do this, close your eyes, focus on one area of your body at a time from head to toe. Notice which parts of your body are tense. Try to relax the muscles in those areas. How will I feel when I quit smoking? Day 1 to 3 weeks Within the first 24 hours, you may start to have some problems that come from quitting tobacco. These problems are very bad 2-3 days after you quit, but they do not often last for more than 2-3 weeks. You may get these symptoms:  Mood swings.  Feeling restless, nervous, angry, or annoyed.  Trouble concentrating.  Dizziness.  Strong desire for high-sugar foods and nicotine.  Weight gain.  Trouble pooping (constipation).  Feeling like you may vomit (nausea).  Coughing or a sore throat.  Changes in how the medicines that you take for other issues work in your body.  Depression.  Trouble sleeping (insomnia). Week 3 and afterward After the first 2-3 weeks of quitting, you may start to notice  more positive results, such as:  Better sense of smell and taste.  Less coughing and sore throat.  Slower heart rate.  Lower blood pressure.  Clearer skin.  Better breathing.  Fewer sick days. Quitting smoking can be hard. Do not give up if you fail the first time. Some people need to try a few times before they succeed. Do your best to stick to your quit plan, and talk with your doctor if you have any questions or concerns. Summary  Smoking tobacco is the leading cause of preventable death. Quitting smoking can be hard, but it is one of the best things that you can do for your health.  When you decide to quit smoking, make a plan to help you succeed.  Quit smoking right away, not slowly over a period of time.  When you start quitting, seek help from your doctor, family, or friends. This information is not intended to replace advice given to you by your health care provider. Make sure you discuss any questions you have with your health care provider. Document Revised: 02/15/2019 Document Reviewed: 08/11/2018 Elsevier Patient Education  [place].  Diabetes Mellitus and Nutrition, Adult When you have diabetes (diabetes mellitus), it is very important to have healthy eating habits because your blood sugar (glucose) levels are greatly affected by what you eat and drink. Eating healthy foods in the appropriate amounts, at about the same times every day, can help you:  Control your blood glucose.  Lower your risk of heart disease.  Improve your blood pressure.  Reach or maintain a healthy weight. Every person with diabetes is different, and each person has different needs for a meal plan. Your health care provider may recommend that you work with a diet and nutrition specialist (dietitian) to make a meal plan that is best for you. Your meal plan may vary depending on factors such as:  The calories you need.  The medicines you take.  Your weight.  Your blood glucose,  blood pressure, and cholesterol levels.  Your activity level.  Other health conditions you have, such as heart or kidney disease. How do carbohydrates affect me? Carbohydrates, also called carbs, affect your blood glucose level more than any other type of food. Eating carbs naturally raises the amount of glucose in your blood. Carb counting is a method for keeping track of how many carbs you eat. Counting carbs is important to keep your blood glucose at a healthy level, especially if you use insulin or take certain oral diabetes medicines. It is important to know how many carbs you can safely have in each meal. This is different for every person. Your dietitian can help you calculate how many carbs you should have at each meal and for each snack. Foods that contain carbs include:  Bread, cereal, rice, pasta, and crackers.  Potatoes and corn.  Peas, beans, and lentils.  Milk and yogurt.  Fruit and juice.  Desserts, such as cakes, cookies, ice cream, and candy. How does alcohol affect me? Alcohol can cause a sudden decrease in blood glucose (hypoglycemia), especially if you use insulin or take certain oral diabetes medicines. Hypoglycemia can be a life-threatening condition. Symptoms of hypoglycemia (sleepiness, dizziness, and confusion) are similar to symptoms of having too much alcohol. If your health care provider says that alcohol is safe for you, follow these guidelines:  Limit alcohol intake to no more than 1 drink per day for nonpregnant women and 2 drinks per day for men. One drink equals 12 oz of beer, 5 oz of wine, or 1 oz of hard liquor.  Do not drink on an empty stomach.  Keep yourself hydrated with water, diet soda, or unsweetened iced tea.  Keep in mind that regular soda, juice, and other mixers may contain a lot of sugar and must be counted as carbs. What are tips for following this plan?  Reading food labels  Start by checking the serving size on the "Nutrition  Facts" label of packaged foods and drinks. The amount of calories, carbs, fats, and other nutrients listed on the label is based on one serving of the item. Many items contain more than one serving per package.  Check the total grams (g) of carbs in one serving. You can  calculate the number of servings of carbs in one serving by dividing the total carbs by 15. For example, if a food has 30 g of total carbs, it would be equal to 2 servings of carbs.  Check the number of grams (g) of saturated and trans fats in one serving. Choose foods that have low or no amount of these fats.  Check the number of milligrams (mg) of salt (sodium) in one serving. Most people should limit total sodium intake to less than 2,300 mg per day.  Always check the nutrition information of foods labeled as "low-fat" or "nonfat". These foods may be higher in added sugar or refined carbs and should be avoided.  Talk to your dietitian to identify your daily goals for nutrients listed on the label. Shopping  Avoid buying canned, premade, or processed foods. These foods tend to be high in fat, sodium, and added sugar.  Shop around the outside edge of the grocery store. This includes fresh fruits and vegetables, bulk grains, fresh meats, and fresh dairy. Cooking  Use low-heat cooking methods, such as baking, instead of high-heat cooking methods like deep frying.  Cook using healthy oils, such as olive, canola, or sunflower oil.  Avoid cooking with butter, cream, or high-fat meats. Meal planning  Eat meals and snacks regularly, preferably at the same times every day. Avoid going long periods of time without eating.  Eat foods high in fiber, such as fresh fruits, vegetables, beans, and whole grains. Talk to your dietitian about how many servings of carbs you can eat at each meal.  Eat 4-6 ounces (oz) of lean protein each day, such as lean meat, chicken, fish, eggs, or tofu. One oz of lean protein is equal to: ? 1 oz of  meat, chicken, or fish. ? 1 egg. ?  cup of tofu.  Eat some foods each day that contain healthy fats, such as avocado, nuts, seeds, and fish. Lifestyle  Check your blood glucose regularly.  Exercise regularly as told by your health care provider. This may include: ? 150 minutes of moderate-intensity or vigorous-intensity exercise each week. This could be brisk walking, biking, or water aerobics. ? Stretching and doing strength exercises, such as yoga or weightlifting, at least 2 times a week.  Take medicines as told by your health care provider.  Do not use any products that contain nicotine or tobacco, such as cigarettes and e-cigarettes. If you need help quitting, ask your health care provider.  Work with a Social worker or diabetes educator to identify strategies to manage stress and any emotional and social challenges. Questions to ask a health care provider  Do I need to meet with a diabetes educator?  Do I need to meet with a dietitian?  What number can I call if I have questions?  When are the best times to check my blood glucose? Where to find more information:  American Diabetes Association: diabetes.org  Academy of Nutrition and Dietetics: www.eatright.[employer] of Diabetes and Digestive and Kidney Diseases (NIH): [employer] Summary  A healthy meal plan will help you control your blood glucose and maintain a healthy lifestyle.  Working with a diet and nutrition specialist (dietitian) can help you make a meal plan that is best for you.  Keep in mind that carbohydrates (carbs) and alcohol have immediate effects on your blood glucose levels. It is important to count carbs and to use alcohol carefully. This information is not intended to replace advice given to you by your health  care provider. Make sure you discuss any questions you have with your health care provider. Document Revised: 05/05/2017 Document Reviewed: 06/27/2016 Elsevier Patient  Education  2020 Elsevier Inc.      Agustina Caroli, MD [facility]

## 2020-01-14 LAB — LIPID PANEL
Chol/HDL Ratio: 2.6 ratio (ref 0.0–5.0)
Cholesterol, Total: 116 mg/dL (ref 100–199)
HDL: 45 mg/dL (ref >39)
LDL Chol Calc (NIH): 55 mg/dL (ref 0–99)
Triglycerides: 80 mg/dL (ref 0–149)
VLDL Cholesterol Cal: 16 mg/dL (ref 5–40)

## 2020-01-14 LAB — COMPREHENSIVE METABOLIC PANEL
ALT: 33 IU/L (ref 0–44)
AST: 28 IU/L (ref 0–40)
Albumin/Globulin Ratio: 2.1 (ref 1.2–2.2)
Albumin: 4.8 g/dL (ref 3.8–4.9)
Alkaline Phosphatase: 55 IU/L (ref 48–121)
BUN/Creatinine Ratio: 16 (ref 9–20)
BUN: 15 mg/dL (ref 6–24)
Bilirubin Total: 0.4 mg/dL (ref 0.0–1.2)
CO2: 22 mmol/L (ref 20–29)
Calcium: 9.9 mg/dL (ref 8.7–10.2)
Chloride: 104 mmol/L (ref 96–106)
Creatinine, Ser: 0.91 mg/dL (ref 0.76–1.27)
GFR calc Af Amer: 111 mL/min/{1.73_m2} (ref >59)
GFR calc non Af Amer: 96 mL/min/{1.73_m2} (ref >59)
Globulin, Total: 2.3 g/dL (ref 1.5–4.5)
Glucose: 87 mg/dL (ref 65–99)
Potassium: 4.2 mmol/L (ref 3.5–5.2)
Sodium: 142 mmol/L (ref 134–144)
Total Protein: 7.1 g/dL (ref 6.0–8.5)

## 2020-01-16 ENCOUNTER — Ambulatory Visit (INDEPENDENT_AMBULATORY_CARE_PROVIDER_SITE_OTHER): Payer: No Typology Code available for payment source | Admitting: Podiatry

## 2020-01-16 ENCOUNTER — Other Ambulatory Visit: Payer: Self-pay

## 2020-01-16 ENCOUNTER — Encounter: Payer: Self-pay | Admitting: Podiatry

## 2020-01-16 DIAGNOSIS — E1140 Type 2 diabetes mellitus with diabetic neuropathy, unspecified: Secondary | ICD-10-CM

## 2020-01-16 DIAGNOSIS — M79674 Pain in right toe(s): Secondary | ICD-10-CM

## 2020-01-16 DIAGNOSIS — B351 Tinea unguium: Secondary | ICD-10-CM

## 2020-01-16 DIAGNOSIS — M79675 Pain in left toe(s): Secondary | ICD-10-CM | POA: Diagnosis not present

## 2020-01-16 DIAGNOSIS — E1149 Type 2 diabetes mellitus with other diabetic neurological complication: Secondary | ICD-10-CM

## 2020-01-16 DIAGNOSIS — L84 Corns and callosities: Secondary | ICD-10-CM | POA: Diagnosis not present

## 2020-01-16 NOTE — Progress Notes (Signed)
Subjective:   Patient ID: Clifford Frederick, male   DOB: 54 y.o.   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  Today I went ahead and I debrided nailbeds 1-5 both feet with no iatrogenic bleeding and I debrided lesions right no iatrogenic bleeding and patient will be seen back as needed

## 2020-02-19 ENCOUNTER — Other Ambulatory Visit: Payer: Self-pay | Admitting: Family Medicine

## 2020-02-19 NOTE — Telephone Encounter (Signed)
Requested Prescriptions  Pending Prescriptions Disp Refills  . JARDIANCE 10 MG TABS tablet [Pharmacy Med Name: Jardiance 10 MG Oral Tablet] 90 tablet 0    Sig: Take 1 tablet by mouth once daily     Endocrinology:  Diabetes - SGLT2 Inhibitors Failed - 02/19/2020  6:37 PM      Failed - LDL in normal range and within 360 days    LDL Chol Calc (NIH)  Date Value Ref Range Status  01/13/2020 55 0 - 99 mg/dL Final         Passed - Cr in normal range and within 360 days    Creatinine, Ser  Date Value Ref Range Status  01/13/2020 0.91 0.76 - 1.27 mg/dL Final         Passed - HBA1C is between 0 and 7.9 and within 180 days    Hemoglobin A1C  Date Value Ref Range Status  01/13/2020 6.3 (A) 4.0 - 5.6 % Final   Hgb A1c MFr Bld  Date Value Ref Range Status  01/15/2019 6.6 (H) 4.8 - 5.6 % Final    Comment:             Prediabetes: 5.7 - 6.4          Diabetes: >6.4          Glycemic control for adults with diabetes: <7.0          Passed - eGFR in normal range and within 360 days    GFR calc Af Amer  Date Value Ref Range Status  01/13/2020 111 >59 mL/min/1.73 Final    Comment:    **[facility] currently reports eGFR in compliance with the current**   recommendations of the [employer]. [facility] will   update reporting as new guidelines are published from the NKF-ASN   Task force.    GFR calc non Af Amer  Date Value Ref Range Status  01/13/2020 96 >59 mL/min/1.73 Final         Passed - Valid encounter within last 6 months    Recent Outpatient Visits          1 month ago Hypertension associated with type 2 diabetes mellitus [facility])   Primary Care at [facility], [place], [place]   4 months ago Dyslipidemia associated with type 2 diabetes mellitus [facility])   Primary Care at [facility], [place], [place]   7 months ago Hypertension associated with type 2 diabetes mellitus [facility])   Primary Care at [facility], Ines Bloomer, MD   9 months ago    Primary Care at [facility], [place], [place]   10 months ago COVID-19 virus infection   Primary Care at [facility], Ines Bloomer, MD      Future Appointments            In 4 months Sag[place], Ines Bloomer, MD Primary Care at [place], [facility]

## 2020-03-16 ENCOUNTER — Ambulatory Visit: Payer: No Typology Code available for payment source | Admitting: Emergency Medicine

## 2020-05-27 ENCOUNTER — Other Ambulatory Visit: Payer: Self-pay | Admitting: Emergency Medicine

## 2020-05-27 DIAGNOSIS — I10 Essential (primary) hypertension: Secondary | ICD-10-CM

## 2020-06-20 ENCOUNTER — Other Ambulatory Visit: Payer: Self-pay | Admitting: Emergency Medicine

## 2020-07-15 ENCOUNTER — Other Ambulatory Visit: Payer: Self-pay

## 2020-07-15 ENCOUNTER — Encounter: Payer: Self-pay | Admitting: Emergency Medicine

## 2020-07-15 ENCOUNTER — Ambulatory Visit: Payer: No Typology Code available for payment source | Admitting: Emergency Medicine

## 2020-07-15 VITALS — BP 136/73 | HR 97 | Temp 98.6°F | Resp 16 | Ht 70.0 in | Wt 230.0 lb

## 2020-07-15 DIAGNOSIS — E1169 Type 2 diabetes mellitus with other specified complication: Secondary | ICD-10-CM

## 2020-07-15 DIAGNOSIS — I2510 Atherosclerotic heart disease of native coronary artery without angina pectoris: Secondary | ICD-10-CM | POA: Diagnosis not present

## 2020-07-15 DIAGNOSIS — E785 Hyperlipidemia, unspecified: Secondary | ICD-10-CM

## 2020-07-15 DIAGNOSIS — I152 Hypertension secondary to endocrine disorders: Secondary | ICD-10-CM | POA: Diagnosis not present

## 2020-07-15 DIAGNOSIS — G4733 Obstructive sleep apnea (adult) (pediatric): Secondary | ICD-10-CM | POA: Diagnosis not present

## 2020-07-15 DIAGNOSIS — Z955 Presence of coronary angioplasty implant and graft: Secondary | ICD-10-CM

## 2020-07-15 DIAGNOSIS — E1159 Type 2 diabetes mellitus with other circulatory complications: Secondary | ICD-10-CM

## 2020-07-15 LAB — POCT GLYCOSYLATED HEMOGLOBIN (HGB A1C): Hemoglobin A1C: 6.6 % — AB (ref 4.0–5.6)

## 2020-07-15 LAB — GLUCOSE, POCT (MANUAL RESULT ENTRY): POC Glucose: 148 mg/dl — AB (ref 70–99)

## 2020-07-15 MED ORDER — TRULICITY 1.5 MG/0.5ML ~~LOC~~ SOAJ: 1.5000 mg | SUBCUTANEOUS | 5 refills | Status: DC

## 2020-07-15 NOTE — Assessment & Plan Note (Signed)
Well-controlled hypertension. Continue amlodipine 5 mg, carvedilol 25 mg twice a day and lisinopril 40 mg daily. Hemoglobin A1c at 6.6 little higher than before. Continue Metformin 1000 mg twice a day, Jardiance 10 mg daily. Will increase weekly Trulicity to 1.5 mg. Diet and nutrition discussed. Follow-up in 3 to 6 months.

## 2020-07-15 NOTE — Progress Notes (Signed)
Clifford Frederick 55 y.o.   Chief Complaint  Patient presents with  . Diabetes    Follow up 6 month  . Hypertension  . Medication Refill    Trulicity   Lab Results  Component Value Date   HGBA1C 6.3 (A) 01/13/2020   BP Readings from Last 3 Encounters:  07/15/20 136/73  01/13/20 127/80  10/30/19 (!) 142/92     HISTORY OF PRESENT ILLNESS: This is a 55 y.o. male with history of diabetes and hypertension here for follow-up and medication refill.  #1 diabetes: On Trulicity, Jardiance, and Metformin. #2 hypertension on amlodipine, carvedilol, and lisinopril #3 history of CAD: On baby aspirin on Plavix 75 mg daily, history of stent placement #4 dyslipidemia on atorvastatin 80 mg daily.  Last cardiology visit on 10/30/2019 assessment and plan as follows: ASSESSMENT:    1. CAD in native artery   2. Pure hypercholesterolemia   3. Essential hypertension, benign    PLAN:    In order of problems listed above:  1.  ASCAD  - cath showed 80% distal RCA and 25% mid to proximal left circumflex.  -He is status post drug-eluting Synergy stent to the RCA 08/16/2017.   -he has had some atypical CP that I suspect is MSK but will get an ETT to rule out ischemia.  -He will continue on ASA 81mg  daily, Brilinta 90mg  BID, BB and statin.   2. Hyperlipidemia  - LDL goal < 70 -LDL was 64 earlier this month - Continue Atorvastatin 80mg  daily.    3.  HTN  -BP controlled on exam -continue amlodipine 5mg  daily, Lisinopril 40mg  daily and Carvedilol 25mg  BID     HPI   Prior to Admission medications   Medication Sig Start Date End Date Taking? Authorizing Provider  amLODipine (NORVASC) 5 MG tablet TAKE 1 TABLET BY MOUTH ONCE DAILY -  PLEASE  KEEP  UPCOMING  APPT  IN  MAY  WITH  DR.  Radford Pax  BEFORE  ANY  MORE  REFILLS 11/13/19   Sueanne Margarita, MD  aspirin 81 MG chewable tablet Chew 1 tablet (81 mg total) by mouth daily. 08/18/17   Daune Perch, NP  atorvastatin (LIPITOR) 80 MG tablet  TAKE 1 TABLET BY MOUTH ONCE DAILY AT  6PM  IN  THE  EVENING 11/12/19   Horald Pollen, MD  blood glucose meter kit and supplies Dispense based on patient and insurance preference. Use up to four times daily as directed. (FOR ICD-10 E10.9, E11.9). 06/04/18   Posey Boyer, MD  carvedilol (COREG) 25 MG tablet Take 1 tablet (25 mg total) by mouth 2 (two) times daily with a meal. 11/27/19   Turner, Eber Hong, MD  clopidogrel (PLAVIX) 75 MG tablet Take 1 tablet (75 mg total) by mouth daily. 11/27/19   Sueanne Margarita, MD  Dulaglutide (TRULICITY) 9.56 LO/7.5IE SOPN Inject 0.5 mLs (0.75 mg total) into the skin once a week. 01/13/20   Horald Pollen, MD  JARDIANCE 10 MG TABS tablet Take 1 tablet by mouth once daily 06/20/20   Horald Pollen, MD  lisinopril (ZESTRIL) 40 MG tablet Take 1 tablet by mouth once daily 05/27/20   Horald Pollen, MD  metFORMIN (GLUCOPHAGE) 1000 MG tablet Take 1 tablet (1,000 mg total) by mouth 2 (two) times daily with a meal. 07/17/19 10/15/19  Gershon Shorten, Ines Bloomer, MD  sildenafil (VIAGRA) 100 MG tablet Take 0.5-1 tablets (50-100 mg total) by mouth daily as needed for erectile dysfunction. 01/13/20  Horald Pollen, MD    No Known Allergies  Patient Active Problem List   Diagnosis Date Noted  . Hypertension associated with type 2 diabetes mellitus ([place]) 07/17/2019  . CAD in native artery   . Pure hypercholesterolemia   . Tobacco abuse 08/15/2017  . Dyslipidemia associated with type 2 diabetes mellitus ([place]) 08/15/2017  . Normocytic anemia 08/15/2017  . Essential hypertension, benign 08/08/2012    Past Medical History:  Diagnosis Date  . Abnormal EKG   . Acute coronary syndrome ([place]) 08/15/2017  . CAD in native artery    A. LHC 08/16/17- DES to RCA  . Chest pain 08/15/2017  . Diabetes mellitus type 2 in obese ([place]) 08/15/2017  . Essential hypertension, benign 08/08/2012  . Fracture of lateral malleolus of left ankle 08/07/2012  . High cholesterol    . Hypertensive urgency 08/15/2017  . Sleep apnea   . Tobacco abuse 08/15/2017  . Unstable angina [facility])     Past Surgical History:  Procedure Laterality Date  . CORONARY STENT INTERVENTION N/A 08/16/2017   Procedure: CORONARY STENT INTERVENTION;  Surgeon: Jettie Booze, MD;  Location: [place] [facility];  Service: Cardiovascular;  Laterality: N/A;  . LEFT HEART CATH AND CORONARY ANGIOGRAPHY N/A 08/16/2017   Procedure: LEFT HEART CATH AND CORONARY ANGIOGRAPHY;  Surgeon: Jettie Booze, MD;  Location: [place] [facility];  Service: Cardiovascular;  Laterality: N/A;  . ORIF ANKLE FRACTURE Left 08/07/2012   Procedure: OPEN REDUCTION INTERNAL FIXATION (ORIF) LEFT ANKLE FRACTURE;  Surgeon: Mcarthur Rossetti, MD;  Location: [place];  Service: Orthopedics;  Laterality: Left;    Social History   Socioeconomic History  . Marital status: Married    Spouse name: Not on file  . Number of children: Not on file  . Years of education: Not on file  . Highest education level: Not on file  Occupational History  . Not on file  Tobacco Use  . Smoking status: Current Some Day Smoker    Packs/day: 0.25    Years: 20.00    Pack years: 5.00    Types: Cigarettes    Start date: 08/16/2017  . Smokeless tobacco: Never Used  . Tobacco comment: currently smokes 3 cigarettes a day  Vaping Use  . Vaping Use: Never used  Substance and Sexual Activity  . Alcohol use: Yes    Alcohol/week: 6.0 standard drinks    Types: 6 Cans of beer per week    Comment: occ  . Drug use: No  . Sexual activity: Not on file  Other Topics Concern  . Not on file  Social History Narrative  . Not on file   Social Determinants of Health   Financial Resource Strain: Not on file  Food Insecurity: Not on file  Transportation Needs: Not on file  Physical Activity: Not on file  Stress: Not on file  Social Connections: Not on file  Intimate Partner Violence: Not on file    Family History  Problem Relation Age of  Onset  . Diabetes Mother   . Hypertension Mother   . Diabetes Brother   . Hypertension Brother      Review of Systems  Constitutional: Negative.  Negative for chills and fever.  HENT: Negative.  Negative for congestion and sore throat.   Respiratory: Negative.  Negative for cough and shortness of breath.   Cardiovascular: Negative.  Negative for chest pain and palpitations.  Gastrointestinal: Negative.  Negative for abdominal pain, diarrhea, nausea and vomiting.  Genitourinary:  Negative.  Negative for hematuria.  Musculoskeletal: Negative.  Negative for back pain and myalgias.  Skin: Negative.  Negative for rash.  Neurological: Negative for dizziness and headaches.  Psychiatric/Behavioral: Negative.   All other systems reviewed and are negative.  Today's Vitals   07/15/20 0810  BP: 136/73  Pulse: 97  Resp: 16  Temp: 98.6 F (37 C)  TempSrc: Temporal  SpO2: 98%  Weight: 230 lb (104.3 kg)  Height: $Remove'5\' 10"'[id number]$  (1.778 m)   Body mass index is 33 kg/m. Wt Readings from Last 3 Encounters:  07/15/20 230 lb (104.3 kg)  01/13/20 226 lb (102.5 kg)  10/30/19 229 lb (103.9 kg)     Physical Exam Vitals reviewed.  Constitutional:      Appearance: Normal appearance.  HENT:     Head: Normocephalic.  Eyes:     Extraocular Movements: Extraocular movements intact.     Conjunctiva/sclera: Conjunctivae normal.     Pupils: Pupils are equal, round, and reactive to light.  Cardiovascular:     Rate and Rhythm: Normal rate and regular rhythm.     Pulses: Normal pulses.     Heart sounds: Normal heart sounds.  Pulmonary:     Effort: Pulmonary effort is normal.     Breath sounds: Normal breath sounds.  Musculoskeletal:        General: Normal range of motion.     Cervical back: Normal range of motion and neck supple.  Skin:    General: Skin is warm and dry.     Capillary Refill: Capillary refill takes less than 2 seconds.  Neurological:     General: No focal deficit present.     Mental  Status: He is alert and oriented to person, place, and time.  Psychiatric:        Mood and Affect: Mood normal.        Behavior: Behavior normal.     Results for orders placed or performed in visit on 07/15/20 (from the past 24 hour(s))  POCT glucose (manual entry)     Status: Abnormal   Collection Time: 07/15/20  8:21 AM  Result Value Ref Range   POC Glucose 148 (A) 70 - 99 mg/dl  POCT glycosylated hemoglobin (Hb A1C)     Status: Abnormal   Collection Time: 07/15/20  8:27 AM  Result Value Ref Range   Hemoglobin A1C 6.6 (A) 4.0 - 5.6 %   HbA1c POC (<> result, manual entry)     HbA1c, POC (prediabetic range)     HbA1c, POC (controlled diabetic range)      ASSESSMENT & PLAN: Hypertension associated with type 2 diabetes mellitus ([place]) Well-controlled hypertension. Continue amlodipine 5 mg, carvedilol 25 mg twice a day and lisinopril 40 mg daily. Hemoglobin A1c at 6.6 little higher than before. Continue Metformin 1000 mg twice a day, Jardiance 10 mg daily. Will increase weekly Trulicity to 1.5 mg. Diet and nutrition discussed. Follow-up in 3 to 6 months.  Simran was seen today for diabetes, hypertension and medication refill.  Diagnoses and all orders for this visit:  Hypertension associated with type 2 diabetes mellitus ([place]) -     POCT glucose (manual entry) -     POCT glycosylated hemoglobin (Hb A1C) -     Dulaglutide (TRULICITY) 1.5 IF/0.2DX SOPN; Inject 1.5 mg into the skin once a week.  Dyslipidemia associated with type 2 diabetes mellitus ([facility])  OSA (obstructive sleep apnea)  Arteriosclerotic cardiovascular disease (ASCVD)  CAD in native artery  History of coronary angioplasty  with insertion of stent    Patient Instructions   [facility] [place], contact number [phone], please schedule appointment for eye exam.    If you have lab work done today you will be contacted with your lab results within the next 2 weeks.   If you have not heard from [place] then please contact us. The fastest way to get your results is to register for My Chart.   IF you received an x-ray today, you will receive an invoice from [facility]. Please contact [facility] at [phone] with questions or concerns regarding your invoice.   IF you received labwork today, you will receive an invoice from [place]. Please contact [facility] at [phone] with questions or concerns regarding your invoice.   Our billing staff will not be able to assist you with questions regarding bills from these companies.  You will be contacted with the lab results as soon as they are available. The fastest way to get your results is to activate your My Chart account. Instructions are located on the last page of this paperwork. If you have not heard from [place] regarding the results in 2 weeks, please contact this office.     Diabetes Mellitus and Nutrition, Adult When you have diabetes, or diabetes mellitus, it is very important to have healthy eating habits because your blood sugar (glucose) levels are greatly affected by what you eat and drink. Eating healthy foods in the right amounts, at about the same times every day, can help you:  Control your blood glucose.  Lower your risk of heart disease.  Improve your blood pressure.  Reach or maintain a healthy weight. What can affect my meal plan? Every person with diabetes is different, and each person has different needs for a meal plan. Your health care provider may recommend that you work with a dietitian to make a meal plan that is best for you. Your meal plan may vary depending on factors such as:  The calories you need.  The medicines you take.  Your weight.  Your blood glucose, blood pressure, and cholesterol levels.  Your activity level.  Other health conditions you have, such as heart or kidney disease. How do carbohydrates affect me? Carbohydrates, also called carbs,  affect your blood glucose level more than any other type of food. Eating carbs naturally raises the amount of glucose in your blood. Carb counting is a method for keeping track of how many carbs you eat. Counting carbs is important to keep your blood glucose at a healthy level, especially if you use insulin or take certain oral diabetes medicines. It is important to know how many carbs you can safely have in each meal. This is different for every person. Your dietitian can help you calculate how many carbs you should have at each meal and for each snack. How does alcohol affect me? Alcohol can cause a sudden decrease in blood glucose (hypoglycemia), especially if you use insulin or take certain oral diabetes medicines. Hypoglycemia can be a life-threatening condition. Symptoms of hypoglycemia, such as sleepiness, dizziness, and confusion, are similar to symptoms of having too much alcohol.  Do not drink alcohol if: ? Your health care provider tells you not to drink. ? You are pregnant, may be pregnant, or are planning to become pregnant.  If you drink alcohol: ? Do not drink on an empty stomach. ? Limit how much you use to:  0-1 drink a day for women.  0-2 drinks a day for men. ? Be aware of how much alcohol is in your drink. In the [place] one drink equals one 12 oz bottle of beer (355 mL), one 5 oz glass of wine (148 mL), or one 1 oz glass of hard liquor (44 mL). ? Keep yourself hydrated with water, diet soda, or unsweetened iced tea.  Keep in mind that regular soda, juice, and other mixers may contain a lot of sugar and must be counted as carbs. What are tips for following this plan? Reading food labels  Start by checking the serving size on the "Nutrition Facts" label of packaged foods and drinks. The amount of calories, carbs, fats, and other nutrients listed on the label is based on one serving of the item. Many items contain more than one serving per package.  Check the total grams (g)  of carbs in one serving. You can calculate the number of servings of carbs in one serving by dividing the total carbs by 15. For example, if a food has 30 g of total carbs per serving, it would be equal to 2 servings of carbs.  Check the number of grams (g) of saturated fats and trans fats in one serving. Choose foods that have a low amount or none of these fats.  Check the number of milligrams (mg) of salt (sodium) in one serving. Most people should limit total sodium intake to less than 2,300 mg per day.  Always check the nutrition information of foods labeled as "low-fat" or "nonfat." These foods may be higher in added sugar or refined carbs and should be avoided.  Talk to your dietitian to identify your daily goals for nutrients listed on the label. Shopping  Avoid buying canned, pre-made, or processed foods. These foods tend to be high in fat, sodium, and added sugar.  Shop around the outside edge of the grocery store. This is where you will most often find fresh fruits and vegetables, bulk grains, fresh meats, and fresh dairy. Cooking  Use low-heat cooking methods, such as baking, instead of high-heat cooking methods like deep frying.  Cook using healthy oils, such as olive, canola, or sunflower oil.  Avoid cooking with butter, cream, or high-fat meats. Meal planning  Eat meals and snacks regularly, preferably at the same times every day. Avoid going long periods of time without eating.  Eat foods that are high in fiber, such as fresh fruits, vegetables, beans, and whole grains. Talk with your dietitian about how many servings of carbs you can eat at each meal.  Eat 4-6 oz (112-168 g) of lean protein each day, such as lean meat, chicken, fish, eggs, or tofu. One ounce (oz) of lean protein is equal to: ? 1 oz (28 g) of meat, chicken, or fish. ? 1 egg. ?  cup (62 g) of tofu.  Eat some foods each day that contain healthy fats, such as avocado, nuts, seeds, and fish.   What foods  should I eat? Fruits Berries. Apples. Oranges. Peaches. Apricots. Plums. Grapes. Mango. Papaya. Pomegranate. Kiwi. Cherries. Vegetables Lettuce. Spinach. Leafy greens, including kale, chard, collard greens, and mustard greens. Beets. Cauliflower. Cabbage. Broccoli. Carrots. Green beans. Tomatoes. Peppers. Onions. Cucumbers. Brussels sprouts. Grains Whole grains, such as whole-wheat or whole-grain bread, crackers, tortillas, cereal, and pasta. Unsweetened oatmeal. Quinoa. Brown or wild rice. Meats and other proteins Seafood. Poultry without skin. Lean cuts of poultry and beef. Tofu. Nuts. Seeds. Dairy Low-fat or fat-free dairy products such as milk, yogurt, and cheese.  The items listed above may not be a complete list of foods and beverages you can eat. Contact a dietitian for more information. What foods should I avoid? Fruits Fruits canned with syrup. Vegetables Canned vegetables. Frozen vegetables with butter or cream sauce. Grains Refined white flour and flour products such as bread, pasta, snack foods, and cereals. Avoid all processed foods. Meats and other proteins Fatty cuts of meat. Poultry with skin. Breaded or fried meats. Processed meat. Avoid saturated fats. Dairy Full-fat yogurt, cheese, or milk. Beverages Sweetened drinks, such as soda or iced tea. The items listed above may not be a complete list of foods and beverages you should avoid. Contact a dietitian for more information. Questions to ask a health care provider  Do I need to meet with a diabetes educator?  Do I need to meet with a dietitian?  What number can I call if I have questions?  When are the best times to check my blood glucose? Where to find more information:  American Diabetes Association: diabetes.org  Academy of Nutrition and Dietetics: www.eatright.[employer] of Diabetes and Digestive and Kidney Diseases: [employer]  Association of Diabetes Care and Education Specialists:  www.diabeteseducator.org Summary  It is important to have healthy eating habits because your blood sugar (glucose) levels are greatly affected by what you eat and drink.  A healthy meal plan will help you control your blood glucose and maintain a healthy lifestyle.  Your health care provider may recommend that you work with a dietitian to make a meal plan that is best for you.  Keep in mind that carbohydrates (carbs) and alcohol have immediate effects on your blood glucose levels. It is important to count carbs and to use alcohol carefully. This information is not intended to replace advice given to you by your health care provider. Make sure you discuss any questions you have with your health care provider. Document Revised: 04/30/2019 Document Reviewed: 04/30/2019 Elsevier Patient Education  2021 Elsevier Inc.      Agustina Caroli, MD [facility]

## 2020-07-15 NOTE — Patient Instructions (Addendum)
[facility] [address] [place], contact number [phone], please schedule appointment for eye exam.    If you have lab work done today you will be contacted with your lab results within the next 2 weeks.  If you have not heard from [place] then please contact us. The fastest way to get your results is to register for My Chart.   IF you received an x-ray today, you will receive an invoice from [facility]. Please contact [facility] at [phone] with questions or concerns regarding your invoice.   IF you received labwork today, you will receive an invoice from [facility]. Please contact [facility] at [phone] with questions or concerns regarding your invoice.   Our billing staff will not be able to assist you with questions regarding bills from these companies.  You will be contacted with the lab results as soon as they are available. The fastest way to get your results is to activate your My Chart account. Instructions are located on the last page of this paperwork. If you have not heard from [place] regarding the results in 2 weeks, please contact this office.     Diabetes Mellitus and Nutrition, Adult When you have diabetes, or diabetes mellitus, it is very important to have healthy eating habits because your blood sugar (glucose) levels are greatly affected by what you eat and drink. Eating healthy foods in the right amounts, at about the same times every day, can help you:  Control your blood glucose.  Lower your risk of heart disease.  Improve your blood pressure.  Reach or maintain a healthy weight. What can affect my meal plan? Every person with diabetes is different, and each person has different needs for a meal plan. Your health care provider may recommend that you work with a dietitian to make a meal plan that is best for you. Your meal plan may vary depending on factors such as:  The calories you need.  The medicines you  take.  Your weight.  Your blood glucose, blood pressure, and cholesterol levels.  Your activity level.  Other health conditions you have, such as heart or kidney disease. How do carbohydrates affect me? Carbohydrates, also called carbs, affect your blood glucose level more than any other type of food. Eating carbs naturally raises the amount of glucose in your blood. Carb counting is a method for keeping track of how many carbs you eat. Counting carbs is important to keep your blood glucose at a healthy level, especially if you use insulin or take certain oral diabetes medicines. It is important to know how many carbs you can safely have in each meal. This is different for every person. Your dietitian can help you calculate how many carbs you should have at each meal and for each snack. How does alcohol affect me? Alcohol can cause a sudden decrease in blood glucose (hypoglycemia), especially if you use insulin or take certain oral diabetes medicines. Hypoglycemia can be a life-threatening condition. Symptoms of hypoglycemia, such as sleepiness, dizziness, and confusion, are similar to symptoms of having too much alcohol.  Do not drink alcohol if: ? Your health care provider tells you not to drink. ? You are pregnant, may be pregnant, or are planning to become pregnant.  If you drink alcohol: ? Do not drink on an empty stomach. ? Limit how much you use to:  0-1 drink a day for women.  0-2 drinks a day for men. ? Be aware of how much  alcohol is in your drink. In the [place] one drink equals one 12 oz bottle of beer (355 mL), one 5 oz glass of wine (148 mL), or one 1 oz glass of hard liquor (44 mL). ? Keep yourself hydrated with water, diet soda, or unsweetened iced tea.  Keep in mind that regular soda, juice, and other mixers may contain a lot of sugar and must be counted as carbs. What are tips for following this plan? Reading food labels  Start by checking the serving size on the  "Nutrition Facts" label of packaged foods and drinks. The amount of calories, carbs, fats, and other nutrients listed on the label is based on one serving of the item. Many items contain more than one serving per package.  Check the total grams (g) of carbs in one serving. You can calculate the number of servings of carbs in one serving by dividing the total carbs by 15. For example, if a food has 30 g of total carbs per serving, it would be equal to 2 servings of carbs.  Check the number of grams (g) of saturated fats and trans fats in one serving. Choose foods that have a low amount or none of these fats.  Check the number of milligrams (mg) of salt (sodium) in one serving. Most people should limit total sodium intake to less than 2,300 mg per day.  Always check the nutrition information of foods labeled as "low-fat" or "nonfat." These foods may be higher in added sugar or refined carbs and should be avoided.  Talk to your dietitian to identify your daily goals for nutrients listed on the label. Shopping  Avoid buying canned, pre-made, or processed foods. These foods tend to be high in fat, sodium, and added sugar.  Shop around the outside edge of the grocery store. This is where you will most often find fresh fruits and vegetables, bulk grains, fresh meats, and fresh dairy. Cooking  Use low-heat cooking methods, such as baking, instead of high-heat cooking methods like deep frying.  Cook using healthy oils, such as olive, canola, or sunflower oil.  Avoid cooking with butter, cream, or high-fat meats. Meal planning  Eat meals and snacks regularly, preferably at the same times every day. Avoid going long periods of time without eating.  Eat foods that are high in fiber, such as fresh fruits, vegetables, beans, and whole grains. Talk with your dietitian about how many servings of carbs you can eat at each meal.  Eat 4-6 oz (112-168 g) of lean protein each day, such as lean meat, chicken,  fish, eggs, or tofu. One ounce (oz) of lean protein is equal to: ? 1 oz (28 g) of meat, chicken, or fish. ? 1 egg. ?  cup (62 g) of tofu.  Eat some foods each day that contain healthy fats, such as avocado, nuts, seeds, and fish.   What foods should I eat? Fruits Berries. Apples. Oranges. Peaches. Apricots. Plums. Grapes. Mango. Papaya. Pomegranate. Kiwi. Cherries. Vegetables Lettuce. Spinach. Leafy greens, including kale, chard, collard greens, and mustard greens. Beets. Cauliflower. Cabbage. Broccoli. Carrots. Green beans. Tomatoes. Peppers. Onions. Cucumbers. Brussels sprouts. Grains Whole grains, such as whole-wheat or whole-grain bread, crackers, tortillas, cereal, and pasta. Unsweetened oatmeal. Quinoa. Brown or wild rice. Meats and other proteins Seafood. Poultry without skin. Lean cuts of poultry and beef. Tofu. Nuts. Seeds. Dairy Low-fat or fat-free dairy products such as milk, yogurt, and cheese. The items listed above may not be a complete list of foods  and beverages you can eat. Contact a dietitian for more information. What foods should I avoid? Fruits Fruits canned with syrup. Vegetables Canned vegetables. Frozen vegetables with butter or cream sauce. Grains Refined white flour and flour products such as bread, pasta, snack foods, and cereals. Avoid all processed foods. Meats and other proteins Fatty cuts of meat. Poultry with skin. Breaded or fried meats. Processed meat. Avoid saturated fats. Dairy Full-fat yogurt, cheese, or milk. Beverages Sweetened drinks, such as soda or iced tea. The items listed above may not be a complete list of foods and beverages you should avoid. Contact a dietitian for more information. Questions to ask a health care provider  Do I need to meet with a diabetes educator?  Do I need to meet with a dietitian?  What number can I call if I have questions?  When are the best times to check my blood glucose? Where to find more  information:  American Diabetes Association: diabetes.org  Academy of Nutrition and Dietetics: www.eatright.[employer] of Diabetes and Digestive and Kidney Diseases: CarFlippers.tn  Association of Diabetes Care and Education Specialists: www.diabeteseducator.org Summary  It is important to have healthy eating habits because your blood sugar (glucose) levels are greatly affected by what you eat and drink.  A healthy meal plan will help you control your blood glucose and maintain a healthy lifestyle.  Your health care provider may recommend that you work with a dietitian to make a meal plan that is best for you.  Keep in mind that carbohydrates (carbs) and alcohol have immediate effects on your blood glucose levels. It is important to count carbs and to use alcohol carefully. This information is not intended to replace advice given to you by your health care provider. Make sure you discuss any questions you have with your health care provider. Document Revised: 04/30/2019 Document Reviewed: 04/30/2019 Elsevier Patient Education  2021 [employer].

## 2020-08-25 ENCOUNTER — Emergency Department (HOSPITAL_COMMUNITY)
Admission: EM | Admit: 2020-08-25 | Discharge: 2020-08-25 | Disposition: A | Discharge status: Home / Self Care | Payer: No Typology Code available for payment source | Attending: Emergency Medicine | Admitting: Emergency Medicine

## 2020-08-25 ENCOUNTER — Ambulatory Visit: Payer: Self-pay | Admitting: *Deleted

## 2020-08-25 ENCOUNTER — Emergency Department (HOSPITAL_COMMUNITY): Payer: No Typology Code available for payment source

## 2020-08-25 ENCOUNTER — Other Ambulatory Visit: Payer: Self-pay

## 2020-08-25 DIAGNOSIS — E669 Obesity, unspecified: Secondary | ICD-10-CM

## 2020-08-25 DIAGNOSIS — Z7984 Long term (current) use of oral hypoglycemic drugs: Secondary | ICD-10-CM | POA: Diagnosis not present

## 2020-08-25 DIAGNOSIS — E119 Type 2 diabetes mellitus without complications: Secondary | ICD-10-CM | POA: Insufficient documentation

## 2020-08-25 DIAGNOSIS — I2510 Atherosclerotic heart disease of native coronary artery without angina pectoris: Secondary | ICD-10-CM | POA: Diagnosis not present

## 2020-08-25 DIAGNOSIS — R072 Precordial pain: Secondary | ICD-10-CM

## 2020-08-25 DIAGNOSIS — I25110 Atherosclerotic heart disease of native coronary artery with unstable angina pectoris: Secondary | ICD-10-CM | POA: Insufficient documentation

## 2020-08-25 DIAGNOSIS — Z7982 Long term (current) use of aspirin: Secondary | ICD-10-CM | POA: Insufficient documentation

## 2020-08-25 DIAGNOSIS — Z79899 Other long term (current) drug therapy: Secondary | ICD-10-CM | POA: Insufficient documentation

## 2020-08-25 DIAGNOSIS — E1169 Type 2 diabetes mellitus with other specified complication: Secondary | ICD-10-CM

## 2020-08-25 DIAGNOSIS — Z7902 Long term (current) use of antithrombotics/antiplatelets: Secondary | ICD-10-CM | POA: Diagnosis not present

## 2020-08-25 DIAGNOSIS — M25511 Pain in right shoulder: Secondary | ICD-10-CM | POA: Insufficient documentation

## 2020-08-25 DIAGNOSIS — I10 Essential (primary) hypertension: Secondary | ICD-10-CM | POA: Diagnosis not present

## 2020-08-25 DIAGNOSIS — F17210 Nicotine dependence, cigarettes, uncomplicated: Secondary | ICD-10-CM | POA: Insufficient documentation

## 2020-08-25 DIAGNOSIS — Z720 Tobacco use: Secondary | ICD-10-CM

## 2020-08-25 DIAGNOSIS — R079 Chest pain, unspecified: Secondary | ICD-10-CM | POA: Diagnosis present

## 2020-08-25 LAB — CBC
HCT: 39.4 % (ref 39.0–52.0)
Hemoglobin: 13.4 g/dL (ref 13.0–17.0)
MCH: 30.5 pg (ref 26.0–34.0)
MCHC: 34 g/dL (ref 30.0–36.0)
MCV: 89.5 fL (ref 80.0–100.0)
Platelets: 206 10*3/uL (ref 150–400)
RBC: 4.4 MIL/uL (ref 4.22–5.81)
RDW: 15.2 % (ref 11.5–15.5)
WBC: 10.3 10*3/uL (ref 4.0–10.5)
nRBC: 0 % (ref 0.0–0.2)

## 2020-08-25 LAB — BASIC METABOLIC PANEL
Anion gap: 8 (ref 5–15)
BUN: 10 mg/dL (ref 6–20)
CO2: 25 mmol/L (ref 22–32)
Calcium: 9.5 mg/dL (ref 8.9–10.3)
Chloride: 105 mmol/L (ref 98–111)
Creatinine, Ser: 1.03 mg/dL (ref 0.61–1.24)
GFR, Estimated: >60 mL/min (ref >60)
Glucose, Bld: 107 mg/dL — ABNORMAL HIGH (ref 70–99)
Potassium: 3.7 mmol/L (ref 3.5–5.1)
Sodium: 138 mmol/L (ref 135–145)

## 2020-08-25 LAB — TROPONIN I (HIGH SENSITIVITY)
Troponin I (High Sensitivity): 6 ng/L (ref <18)
Troponin I (High Sensitivity): 6 ng/L (ref <18)

## 2020-08-25 MED ORDER — OXYCODONE-ACETAMINOPHEN 5-325 MG PO TABS
1.0000 | ORAL_TABLET | Freq: Once | ORAL | Status: AC
  Administered 2020-08-25: 1 via ORAL
  Filled 2020-08-25: qty 1

## 2020-08-25 MED ORDER — METHOCARBAMOL 500 MG PO TABS
500.0000 mg | ORAL_TABLET | Freq: Once | ORAL | Status: AC
  Administered 2020-08-25: 500 mg via ORAL
  Filled 2020-08-25: qty 1

## 2020-08-25 NOTE — ED Notes (Signed)
Patient given discharge paperwork and instructions. Verbalized understanding of teaching. No IV access. Ambulatory to exit in NAD with steady gait. 

## 2020-08-25 NOTE — Telephone Encounter (Signed)
Patient and wife are calling to report R sided upper chest pain- severe- started this morning. Patient states he may have overworked that area of body- but with history- disposition directs ED- patient is there now. Reason for Disposition . SEVERE chest pain  Answer Assessment - Initial Assessment Questions 1. LOCATION: "Where does it hurt?"       Upper R part of chest 2. RADIATION: "Does the pain go anywhere else?" (e.g., into neck, jaw, arms, back)     No radation 3. ONSET: "When did the chest pain begin?" (Minutes, hours or days)      Started this morning- woke with pain 4. PATTERN "Does the pain come and go, or has it been constant since it started?"  "Does it get worse with exertion?"      Constant, hurts when moves  5. DURATION: "How long does it last" (e.g., seconds, minutes, hours)     No- constant 6. SEVERITY: "How bad is the pain?"  (e.g., Scale 1-10; mild, moderate, or severe)    - MILD (1-3): doesn't interfere with normal activities     - MODERATE (4-7): interferes with normal activities or awakens from sleep    - SEVERE (8-10): excruciating pain, unable to do any normal activities       7-8 7. CARDIAC RISK FACTORS: "Do you have any history of heart problems or risk factors for heart disease?" (e.g., angina, prior heart attack; diabetes, high blood pressure, high cholesterol, smoker, or strong family history of heart disease)     Blood pressure, high cholesterol, stint history 8. PULMONARY RISK FACTORS: "Do you have any history of lung disease?"  (e.g., blood clots in lung, asthma, emphysema, birth control pills)     no 9. CAUSE: "What do you think is causing the chest pain?"     Over use 10. OTHER SYMPTOMS: "Do you have any other symptoms?" (e.g., dizziness, nausea, vomiting, sweating, fever, difficulty breathing, cough)       no 11. PREGNANCY: "Is there any chance you are pregnant?" "When was your last menstrual period?"       n/a  Protocols used: CHEST PAIN-A-AH

## 2020-08-25 NOTE — Consult Note (Signed)
Cardiology Consultation:   Patient ID: Clifford Frederick MRN: [id number]; DOB: 08/24/1965  Admit date: 08/25/2020 Date of Consult: 08/25/2020  PCP:  Frederick, Clifford Jose, MD   [facility]  Cardiologist:  Clifford Turner, MD   Patient Profile:   Clifford Frederick is a 54 y.o. male with a hx of CAD s/p prior PCI to distal RCA 2019, type 2 diabetes, hyperlipidemia, hypertension, tobacco use who is being seen today for the evaluation of chest pain at the request of Dr. Dykstra.  History of Present Illness:   Clifford Frederick was in his usual state of health until early this AM. Yesterday he was lifting 25 lb metal pieces (about 50 repetitions) which he had not done in several months. He was asymptomatic at the time, but this morning when he woke up he had right sided pectoral soreness, worse when he moved his arm. He tried to take ibuprofen and work through it today, but he had continued soreness. He left work and on arriving home, his wife took him to the ER. In the ER, hsTnI unremarkable x2. However, there was some concern that ECG may have changed. Cardiology consulted for further evaluation.  Of note, he lost ~60 lbs intentionally. Usually very active, no limitations. He is still smoking, but notes that he does not crave it--more out of habit as he is able to smoke at his job.   Past Medical History:  Diagnosis Date  . Abnormal EKG   . Acute coronary syndrome (HCC) 08/15/2017  . CAD in native artery    A. LHC 08/16/17- DES to RCA  . Chest pain 08/15/2017  . Diabetes mellitus type 2 in obese (HCC) 08/15/2017  . Essential hypertension, benign 08/08/2012  . Fracture of lateral malleolus of left ankle 08/07/2012  . High cholesterol   . Hypertensive urgency 08/15/2017  . Sleep apnea   . Tobacco abuse 08/15/2017  . Unstable angina (HCC)     Past Surgical History:  Procedure Laterality Date  . CORONARY STENT INTERVENTION N/A 08/16/2017   Procedure: CORONARY STENT INTERVENTION;   Surgeon: Varanasi, Jayadeep S, MD;  Location: [facility] INVASIVE CV LAB;  Service: Cardiovascular;  Laterality: N/A;  . LEFT HEART CATH AND CORONARY ANGIOGRAPHY N/A 08/16/2017   Procedure: LEFT HEART CATH AND CORONARY ANGIOGRAPHY;  Surgeon: Varanasi, Jayadeep S, MD;  Location: [facility] INVASIVE CV LAB;  Service: Cardiovascular;  Laterality: N/A;  . ORIF ANKLE FRACTURE Left 08/07/2012   Procedure: OPEN REDUCTION INTERNAL FIXATION (ORIF) LEFT ANKLE FRACTURE;  Surgeon: Christopher Y Blackman, MD;  Location: [facility] OR;  Service: Orthopedics;  Laterality: Left;     Home Medications:  Prior to Admission medications   Medication Sig Start Date End Date Taking? Authorizing Provider  amLODipine (NORVASC) 5 MG tablet TAKE 1 TABLET BY MOUTH ONCE DAILY -  PLEASE  KEEP  UPCOMING  APPT  IN  MAY  WITH  DR.  TURNER  BEFORE  ANY  MORE  REFILLS 11/13/19   Frederick, Clifford R, MD  aspirin 81 MG chewable tablet Chew 1 tablet (81 mg total) by mouth daily. 08/18/17   Hammond, Janine, NP  atorvastatin (LIPITOR) 80 MG tablet TAKE 1 TABLET BY MOUTH ONCE DAILY AT  6PM  IN  THE  EVENING 11/12/19   Frederick, Clifford Jose, MD  blood glucose meter kit and supplies Dispense based on patient and insurance preference. Use up to four times daily as directed. (FOR ICD-10 E10.9, E11.9). 06/04/18   Hopper, David H, MD    carvedilol (COREG) 25 MG tablet Take 1 tablet (25 mg total) by mouth 2 (two) times daily with a meal. 11/27/19   Frederick, Clifford R, MD  clopidogrel (PLAVIX) 75 MG tablet Take 1 tablet (75 mg total) by mouth daily. 11/27/19   Frederick, Clifford R, MD  Dulaglutide (TRULICITY) 1.5 MG/0.5ML SOPN Inject 1.5 mg into the skin once a week. 07/15/20   Frederick, Clifford Jose, MD  JARDIANCE 10 MG TABS tablet Take 1 tablet by mouth once daily 06/20/20   Frederick, Clifford Jose, MD  lisinopril (ZESTRIL) 40 MG tablet Take 1 tablet by mouth once daily 05/27/20   Frederick, Clifford Jose, MD  metFORMIN (GLUCOPHAGE) 1000 MG tablet Take 1 tablet (1,000 mg total) by mouth 2 (two)  times daily with a meal. 07/17/19 10/15/19  Frederick, Clifford Jose, MD  sildenafil (VIAGRA) 100 MG tablet Take 0.5-1 tablets (50-100 mg total) by mouth daily as needed for erectile dysfunction. 01/13/20   Frederick, Clifford Jose, MD    Inpatient Medications: Scheduled Meds:  Continuous Infusions:  PRN Meds:   Allergies:   No Known Allergies  Social History:   Social History   Socioeconomic History  . Marital status: Married    Spouse name: Not on file  . Number of children: Not on file  . Years of education: Not on file  . Highest education level: Not on file  Occupational History  . Not on file  Tobacco Use  . Smoking status: Current Some Day Smoker    Packs/day: 0.25    Years: 20.00    Pack years: 5.00    Types: Cigarettes    Start date: 08/16/2017  . Smokeless tobacco: Never Used  . Tobacco comment: currently smokes 3 cigarettes a day  Vaping Use  . Vaping Use: Never used  Substance and Sexual Activity  . Alcohol use: Yes    Alcohol/week: 6.0 standard drinks    Types: 6 Cans of beer per week    Comment: occ  . Drug use: No  . Sexual activity: Not on file  Other Topics Concern  . Not on file  Social History Narrative  . Not on file   Social Determinants of Health   Financial Resource Strain: Not on file  Food Insecurity: Not on file  Transportation Needs: Not on file  Physical Activity: Not on file  Stress: Not on file  Social Connections: Not on file  Intimate Partner Violence: Not on file    Family History:    Family History  Problem Relation Age of Onset  . Diabetes Mother   . Hypertension Mother   . Diabetes Brother   . Hypertension Brother      ROS:  Please see the history of present illness.  Constitutional: Negative for chills, fever, night sweats, unintentional weight loss  HENT: Negative for ear pain and hearing loss.   Eyes: Negative for loss of vision and eye pain.  Respiratory: Negative for cough, sputum, wheezing.   Cardiovascular: See  HPI. Gastrointestinal: Negative for abdominal pain, melena, and hematochezia.  Genitourinary: Negative for dysuria and hematuria.  Musculoskeletal: Negative for falls and myalgias.  Skin: Negative for itching and rash.  Neurological: Negative for focal weakness, focal sensory changes and loss of consciousness.  Endo/Heme/Allergies: Does not bruise/bleed easily.  All other ROS reviewed and negative.     Physical Exam/Data:   Vitals:   08/25/20 1730 08/25/20 1745 08/25/20 1800 08/25/20 1815  BP: (!) 162/101 (!) 155/98 (!) 161/101 (!) 158/99  Pulse:  92    84  Resp: 10 17 12 14  Temp:    98.2 F (36.8 C)  TempSrc:    Oral  SpO2:  95%  99%  Weight:      Height:       No intake or output data in the 24 hours ending 08/25/20 1849 Last 3 Weights 08/25/2020 07/15/2020 01/13/2020  Weight (lbs) 225 lb 230 lb 226 lb  Weight (kg) 102.059 kg 104.327 kg 102.513 kg     Body mass index is 32.28 kg/m.  General:  Well nourished, well developed, in no acute distress HEENT: normal Lymph: no adenopathy Neck: no JVD Endocrine:  No thryomegaly Vascular: No carotid bruits; RA and DP pulses 2+ bilaterally Cardiac:  normal S1, S2; RRR; no murmur. Tender to palpation over right pectoral area Lungs:  clear to auscultation bilaterally, no wheezing, rhonchi or rales  Abd: soft, nontender, no hepatomegaly  Ext: no edema Musculoskeletal:  No deformities, BUE and BLE strength normal and equal Skin: warm and dry  Neuro:  CNs 2-12 intact, no focal abnormalities noted Psych:  Normal affect   EKG:  The EKG was personally reviewed and demonstrates:  Sinus rhythm with nonspecific ST changes in inferolateral leads     Telemetry:  Telemetry was personally reviewed and demonstrates:  NSR  Relevant CV Studies: Cath 08/16/2017  Dist RCA lesion is 80% stenosed.  Prox Cx to Mid Cx lesion is 25% stenosed.  There is mild to moderate left ventricular systolic dysfunction.  There is no aortic valve stenosis.  A  drug-eluting stent was successfully placed using a STENT SYNERGY DES 3X16.  Post intervention, there is a 0% residual stenosis.   Continue dual antiplatelet therapy with aspirin and Brilinta for 1 year along with aggressive secondary prevention.    I stressed the importance of compliance with his DAPT, and discussed the risks of noncompliance.    Echo 08/17/2017 - Left ventricle: The cavity size was normal. There was moderate  concentric hypertrophy. Systolic function was normal. The  estimated ejection fraction was in the range of 60% to 65%. There  is hypokinesis of the apicalinferolateral myocardium. Features  are consistent with a pseudonormal left ventricular filling  pattern, with concomitant abnormal relaxation and increased  filling pressure (grade 2 diastolic dysfunction). Doppler  parameters are consistent with high ventricular filling pressure.  - Aortic valve: Trileaflet; mildly thickened, mildly calcified  leaflets.  - Mitral valve: Calcified annulus. There was trivial regurgitation.  - Left atrium: The atrium was mildly dilated.  - Pulmonary arteries: Systolic pressure could not be accurately  estimated.   Laboratory Data:  High Sensitivity Troponin:   Recent Labs  Lab 08/25/20 1015 08/25/20 1217  TROPONINIHS 6 6     Chemistry Recent Labs  Lab 08/25/20 1015  NA 138  K 3.7  CL 105  CO2 25  GLUCOSE 107*  BUN 10  CREATININE 1.03  CALCIUM 9.5  GFRNONAA >60  ANIONGAP 8    No results for input(s): PROT, ALBUMIN, AST, ALT, ALKPHOS, BILITOT in the last 168 hours. Hematology Recent Labs  Lab 08/25/20 1015  WBC 10.3  RBC 4.40  HGB 13.4  HCT 39.4  MCV 89.5  MCH 30.5  MCHC 34.0  RDW 15.2  PLT 206   BNPNo results for input(s): BNP, PROBNP in the last 168 hours.  DDimer No results for input(s): DDIMER in the last 168 hours.   Radiology/Studies:  DG Chest 2 View  Result Date: 08/25/2020 CLINICAL DATA:  Right-sided chest pain EXAM:    CHEST - 2 VIEW COMPARISON:  04/22/2019 FINDINGS: The heart size and mediastinal contours are within normal limits. Both lungs are clear. The visualized skeletal structures are unremarkable. IMPRESSION: No active cardiopulmonary disease. Electronically Signed   By: Inez Catalina M.D.   On: 08/25/2020 11:02     Assessment and Plan:   Chest pain History of CAD with prior PCI 2019 -hsTnI reassuring.  -ECG with inferolateral ST depressions. I reviewed his prior ECGs. The one from 04/22/2019 has a somewhat wandering baseline but doesn't have the degree of ST depressions seen today. However, the ECG from 04/03/2018 does have this ST depression pattern and is similar to todays -given that he has reproducible chest wall pain, unremarkable troponins, I would recommend discharge to home with close outpatient follow up -he understands the warning signs and will seek medical attention if these occur  Risk factors: counseled on tobacco cessation and risk factor management today Hypercholesterolemia Tobacco use Type II diabetes Obesity, BMI 32   Risk Assessment/Risk Scores:   HEAR Score (for undifferentiated chest pain):  HEAR Score: 4{  For questions or updates, please contact [place] Please consult [email or web address] for contact info under  Signed, Buford Dresser, MD  08/25/2020 6:49 PM

## 2020-08-25 NOTE — ED Triage Notes (Signed)
C/o chest pain upon waking up this morning. Stated he has long of HBP, stents and cholesterol. ASA 81 mg taken along with ibuprofen and no relief.

## 2020-08-25 NOTE — Telephone Encounter (Signed)
Chest pain R side

## 2020-08-25 NOTE — ED Provider Notes (Signed)
[facility] EMERGENCY DEPARTMENT Provider Note   CSN: [id number] Arrival date & time: 08/25/20  1001     History Chief Complaint  Patient presents with  . Chest Pain    Clifford Frederick is a 55 y.o. male.  Presents here with concern for chest pain.  He states yesterday he was in his normal state of health.  Was doing some more strenuous activity than normal working with very heavy objects at work.  States that he did not experience any chest pain or difficulty breathing while this was going on.  This morning when he woke up he was noting pain on the right side of his chest, right upper chest, into his right shoulder.  Pain worse with certain positions and certain movements.  Not exacerbated by exertion, symptoms occurring at rest.  Pain up to 6 out of 10.  He reports compliance with all of his heart medications including aspirin and Plavix.  Per review of chart, patient has a history of coronary artery disease, LHC in 2019, DES to RCA.    HPI  HPI: A 55 year old patient with a history of treated diabetes, hypertension, hypercholesterolemia and obesity presents for evaluation of chest pain. Initial onset of pain was more than 6 hours ago. The patient's chest pain is well-localized, is sharp and is not worse with exertion. The patient's chest pain is middle- or left-sided, is not described as heaviness/pressure/tightness and does not radiate to the arms/jaw/neck. The patient does not complain of nausea and denies diaphoresis. The patient has smoked in the past 90 days. The patient has no history of stroke, has no history of peripheral artery disease and has no relevant family history of coronary artery disease (first degree relative at less than age 57).   Past Medical History:  Diagnosis Date  . Abnormal EKG   . Acute coronary syndrome ([place]) 08/15/2017  . CAD in native artery    A. LHC 08/16/17- DES to RCA  . Chest pain 08/15/2017  . Diabetes mellitus type 2 in obese ([place])  08/15/2017  . Essential hypertension, benign 08/08/2012  . Fracture of lateral malleolus of left ankle 08/07/2012  . High cholesterol   . Hypertensive urgency 08/15/2017  . Sleep apnea   . Tobacco abuse 08/15/2017  . Unstable angina [facility])     Patient Active Problem List   Diagnosis Date Noted  . Hypertension associated with type 2 diabetes mellitus ([place]) 07/17/2019  . CAD in native artery   . Pure hypercholesterolemia   . Tobacco abuse 08/15/2017  . Dyslipidemia associated with type 2 diabetes mellitus ([place]) 08/15/2017  . Normocytic anemia 08/15/2017  . Essential hypertension, benign 08/08/2012    Past Surgical History:  Procedure Laterality Date  . CORONARY STENT INTERVENTION N/A 08/16/2017   Procedure: CORONARY STENT INTERVENTION;  Surgeon: Jettie Booze, MD;  Location: [place] CV LAB;  Service: Cardiovascular;  Laterality: N/A;  . LEFT HEART CATH AND CORONARY ANGIOGRAPHY N/A 08/16/2017   Procedure: LEFT HEART CATH AND CORONARY ANGIOGRAPHY;  Surgeon: Jettie Booze, MD;  Location: [place] CV LAB;  Service: Cardiovascular;  Laterality: N/A;  . ORIF ANKLE FRACTURE Left 08/07/2012   Procedure: OPEN REDUCTION INTERNAL FIXATION (ORIF) LEFT ANKLE FRACTURE;  Surgeon: Mcarthur Rossetti, MD;  Location: [facility];  Service: Orthopedics;  Laterality: Left;       Family History  Problem Relation Age of Onset  . Diabetes Mother   . Hypertension Mother   . Diabetes Brother   .  Hypertension Brother     Social History   Tobacco Use  . Smoking status: Current Some Day Smoker    Packs/day: 0.25    Years: 20.00    Pack years: 5.00    Types: Cigarettes    Start date: 08/16/2017  . Smokeless tobacco: Never Used  . Tobacco comment: currently smokes 3 cigarettes a day  Vaping Use  . Vaping Use: Never used  Substance Use Topics  . Alcohol use: Yes    Alcohol/week: 6.0 standard drinks    Types: 6 Cans of beer per week    Comment: occ  . Drug use: No    Home  Medications Prior to Admission medications   Medication Sig Start Date End Date Taking? Authorizing Provider  amLODipine (NORVASC) 5 MG tablet TAKE 1 TABLET BY MOUTH ONCE DAILY -  PLEASE  KEEP  UPCOMING  APPT  IN  MAY  WITH  DR.  Radford Pax  BEFORE  ANY  MORE  REFILLS 11/13/19   Sueanne Margarita, MD  aspirin 81 MG chewable tablet Chew 1 tablet (81 mg total) by mouth daily. 08/18/17   Daune Perch, NP  atorvastatin (LIPITOR) 80 MG tablet TAKE 1 TABLET BY MOUTH ONCE DAILY AT  6PM  IN  THE  EVENING 11/12/19   Horald Pollen, MD  blood glucose meter kit and supplies Dispense based on patient and insurance preference. Use up to four times daily as directed. (FOR ICD-10 E10.9, E11.9). 06/04/18   Posey Boyer, MD  carvedilol (COREG) 25 MG tablet Take 1 tablet (25 mg total) by mouth 2 (two) times daily with a meal. 11/27/19   Turner, Eber Hong, MD  clopidogrel (PLAVIX) 75 MG tablet Take 1 tablet (75 mg total) by mouth daily. 11/27/19   Sueanne Margarita, MD  Dulaglutide (TRULICITY) 1.5 YQ/6.5HQ SOPN Inject 1.5 mg into the skin once a week. 07/15/20   Horald Pollen, MD  JARDIANCE 10 MG TABS tablet Take 1 tablet by mouth once daily 06/20/20   Horald Pollen, MD  lisinopril (ZESTRIL) 40 MG tablet Take 1 tablet by mouth once daily 05/27/20   Horald Pollen, MD  metFORMIN (GLUCOPHAGE) 1000 MG tablet Take 1 tablet (1,000 mg total) by mouth 2 (two) times daily with a meal. 07/17/19 10/15/19  Sagardia, Ines Bloomer, MD  sildenafil (VIAGRA) 100 MG tablet Take 0.5-1 tablets (50-100 mg total) by mouth daily as needed for erectile dysfunction. 01/13/20   Horald Pollen, MD    Allergies    Patient has no known allergies.  Review of Systems   Review of Systems  Constitutional: Negative for chills and fever.  HENT: Negative for ear pain and sore throat.   Eyes: Negative for pain and visual disturbance.  Respiratory: Negative for cough and shortness of breath.   Cardiovascular: Positive for chest  pain. Negative for palpitations.  Gastrointestinal: Negative for abdominal pain and vomiting.  Genitourinary: Negative for dysuria and hematuria.  Musculoskeletal: Negative for arthralgias and back pain.  Skin: Negative for color change and rash.  Neurological: Negative for seizures and syncope.  All other systems reviewed and are negative.   Physical Exam Updated Vital Signs BP (!) 158/99   Pulse 84   Temp 98.2 F (36.8 C) (Oral)   Resp 14   Ht $R'5\' 10"'cO$  (1.778 m)   Wt 102.1 kg   SpO2 99%   BMI 32.28 kg/m   Physical Exam Vitals and nursing note reviewed.  Constitutional:  Appearance: He is well-developed.  HENT:     Head: Normocephalic and atraumatic.  Eyes:     Conjunctiva/sclera: Conjunctivae normal.  Cardiovascular:     Rate and Rhythm: Normal rate and regular rhythm.     Heart sounds: No murmur heard.   Pulmonary:     Effort: Pulmonary effort is normal. No respiratory distress.     Breath sounds: Normal breath sounds.  Abdominal:     Palpations: Abdomen is soft.     Tenderness: There is no abdominal tenderness.  Musculoskeletal:     Cervical back: Neck supple.     Right lower leg: No edema.     Left lower leg: No edema.  Skin:    General: Skin is warm and dry.  Neurological:     General: No focal deficit present.     Mental Status: He is alert.  Psychiatric:        Mood and Affect: Mood normal.        Behavior: Behavior normal.     ED Results / Procedures / Treatments   Labs (all labs ordered are listed, but only abnormal results are displayed) Labs Reviewed  BASIC METABOLIC PANEL - Abnormal; Notable for the following components:      Result Value   Glucose, Bld 107 (*)    All other components within normal limits  CBC  TROPONIN I (HIGH SENSITIVITY)  TROPONIN I (HIGH SENSITIVITY)    EKG EKG Interpretation  Date/Time:  Tuesday August 25 2020 16:45:51 EDT Ventricular Rate:  88 PR Interval:  156 QRS Duration: 93 QT Interval:  349 QTC  Calculation: 423 R Axis:   65 Text Interpretation: Sinus rhythm Minimal ST depression, inferior leads Confirmed by Madalyn Rob [phone]) on 08/25/2020 4:48:51 PM   Radiology DG Chest 2 View  Result Date: 08/25/2020 CLINICAL DATA:  Right-sided chest pain EXAM: CHEST - 2 VIEW COMPARISON:  04/22/2019 FINDINGS: The heart size and mediastinal contours are within normal limits. Both lungs are clear. The visualized skeletal structures are unremarkable. IMPRESSION: No active cardiopulmonary disease. Electronically Signed   By: Inez Catalina M.D.   On: 08/25/2020 11:02    Procedures Procedures   Medications Ordered in ED Medications  oxyCODONE-acetaminophen (PERCOCET/ROXICET) 5-325 MG per tablet 1 tablet (1 tablet Oral Given 08/25/20 1632)  methocarbamol (ROBAXIN) tablet 500 mg (500 mg Oral Given 08/25/20 1632)    ED Course  I have reviewed the triage vital signs and the nursing notes.  Pertinent labs & imaging results that were available during my care of the patient were reviewed by me and considered in my medical decision making (see chart for details).  Clinical Course as of 08/25/20 2025  Tue Aug 25, 2020  1700 D/w Angie with cards - she will review ekg with her attending and call back [RD]    Clinical Course User Index [RD] Lucrezia Starch, MD   MDM Rules/Calculators/A&P HEAR Score: 34                       55 year old male with history of coronary artery disease, prior PCI in 2019 presents to ER with concern for chest pain.  Based on history, concern for most likely MSK however given his past history, check EKG and troponins.  Troponins were reassuring per EKG did have some inferolateral ST depressions.  Discussed case with cardiology who came to bedside and evaluated patient.  They felt these were similar to EKGs from 2019 and given his reproducible chest  wall pain and unremarkable troponins, recommended discharge with close outpatient follow-up.  Patient is stable vital signs and  looks well.  Will discharge with plan for close cards outpatient follow-up.  After the discussed management above, the patient was determined to be safe for discharge.  The patient was in agreement with this plan and all questions regarding their care were answered.  ED return precautions were discussed and the patient will return to the ED with any significant worsening of condition.  Final Clinical Impression(s) / ED Diagnoses Final diagnoses:  Chest pain, unspecified type    Rx / DC Orders ED Discharge Orders    None       Lucrezia Starch, MD 08/25/20 2031

## 2020-08-25 NOTE — Discharge Instructions (Addendum)
Follow-up with your cardiologist.  Return to ER if you develop worsening pain, difficulty breathing or other new concerning symptom.

## 2020-08-28 ENCOUNTER — Ambulatory Visit: Payer: No Typology Code available for payment source | Admitting: Registered Nurse

## 2020-08-28 ENCOUNTER — Other Ambulatory Visit: Payer: Self-pay

## 2020-08-28 ENCOUNTER — Encounter: Payer: Self-pay | Admitting: Registered Nurse

## 2020-08-28 VITALS — BP 129/87 | HR 100 | Temp 98.0°F | Resp 18 | Ht 70.0 in | Wt 224.0 lb

## 2020-08-28 DIAGNOSIS — M25511 Pain in right shoulder: Secondary | ICD-10-CM | POA: Diagnosis not present

## 2020-08-28 MED ORDER — CYCLOBENZAPRINE HCL 5 MG PO TABS
5.0000 mg | ORAL_TABLET | Freq: Three times a day (TID) | ORAL | 1 refills | Status: AC | PRN
Start: 2020-08-28 — End: ?

## 2020-08-28 NOTE — Patient Instructions (Signed)
° ° ° °  If you have lab work done today you will be contacted with your lab results within the next 2 weeks.  If you have not heard from us then please contact us. The fastest way to get your results is to register for My Chart. ° ° °IF you received an x-ray today, you will receive an invoice from [facility]. Please contact [facility] at [phone] with questions or concerns regarding your invoice.  ° °IF you received labwork today, you will receive an invoice from [facility]. Please contact [facility] at [phone] with questions or concerns regarding your invoice.  ° °Our billing staff will not be able to assist you with questions regarding bills from these companies. ° °You will be contacted with the lab results as soon as they are available. The fastest way to get your results is to activate your My Chart account. Instructions are located on the last page of this paperwork. If you have not heard from us regarding the results in 2 weeks, please contact this office. °  ° ° ° °

## 2020-08-28 NOTE — Progress Notes (Signed)
Established Patient Office Visit  Subjective:  Patient ID: Clifford Frederick, male    DOB: [birth year]  Age: 56 y.o. MRN: [id number]  CC:  Chief Complaint  Patient presents with  . Hospitalization Follow-up    Patient states he was having some right shoulder pain and back pain after work. Patient states he went to ED and was given two pills and still having pain    HPI Clifford Frederick presents for R shoulder pain  Seen in ED on Tuesday Cardiac etiology ruled out with negative troponin, unchanged EKG Recommended that he follow up outpatient with cardiologist Dr. Radford Pax, visit scheduled for 09/08/20 Suggested conservative management with tylenol, RICE, rest. Unfortunately ongoing pain. Reproducible. Same in quality and intensity. Better when he starts moving but worse upon waking, worse after sitting still.  No new bruising.  Feels it in shoulder blade and pectoral muscle.  No other concerns today.    Past Medical History:  Diagnosis Date  . Abnormal EKG   . Acute coronary syndrome ([place]) 08/15/2017  . CAD in native artery    A. LHC 08/16/17- DES to RCA  . Chest pain 08/15/2017  . Diabetes mellitus type 2 in obese ([place]) 08/15/2017  . Essential hypertension, benign 08/08/2012  . Fracture of lateral malleolus of left ankle 08/07/2012  . High cholesterol   . Hypertensive urgency 08/15/2017  . Sleep apnea   . Tobacco abuse 08/15/2017  . Unstable angina [facility])     Past Surgical History:  Procedure Laterality Date  . CORONARY STENT INTERVENTION N/A 08/16/2017   Procedure: CORONARY STENT INTERVENTION;  Surgeon: Jettie Booze, MD;  Location: [place] CV LAB;  Service: Cardiovascular;  Laterality: N/A;  . LEFT HEART CATH AND CORONARY ANGIOGRAPHY N/A 08/16/2017   Procedure: LEFT HEART CATH AND CORONARY ANGIOGRAPHY;  Surgeon: Jettie Booze, MD;  Location: [place] [facility] LAB;  Service: Cardiovascular;  Laterality: N/A;  . ORIF ANKLE FRACTURE Left 08/07/2012   Procedure: OPEN REDUCTION  INTERNAL FIXATION (ORIF) LEFT ANKLE FRACTURE;  Surgeon: Mcarthur Rossetti, MD;  Location: [facility];  Service: Orthopedics;  Laterality: Left;    Family History  Problem Relation Age of Onset  . Diabetes Mother   . Hypertension Mother   . Diabetes Brother   . Hypertension Brother     Social History   Socioeconomic History  . Marital status: Married    Spouse name: Not on file  . Number of children: Not on file  . Years of education: Not on file  . Highest education level: Not on file  Occupational History  . Not on file  Tobacco Use  . Smoking status: Current Some Day Smoker    Packs/day: 0.25    Years: 20.00    Pack years: 5.00    Types: Cigarettes    Start date: 08/16/2017  . Smokeless tobacco: Never Used  . Tobacco comment: currently smokes 3 cigarettes a day  Vaping Use  . Vaping Use: Never used  Substance and Sexual Activity  . Alcohol use: Yes    Alcohol/week: 6.0 standard drinks    Types: 6 Cans of beer per week    Comment: occ  . Drug use: No  . Sexual activity: Not on file  Other Topics Concern  . Not on file  Social History Narrative  . Not on file   Social Determinants of Health   Financial Resource Strain: Not on file  Food Insecurity: Not on file  Transportation Needs: Not on file  Physical Activity: Not on file  Stress: Not on file  Social Connections: Not on file  Intimate Partner Violence: Not on file    Outpatient Medications Prior to Visit  Medication Sig Dispense Refill  . amLODipine (NORVASC) 5 MG tablet TAKE 1 TABLET BY MOUTH ONCE DAILY -  PLEASE  KEEP  UPCOMING  APPT  IN  MAY  WITH  DR.  Radford Pax  BEFORE  ANY  MORE  REFILLS 90 tablet 3  . aspirin 81 MG chewable tablet Chew 1 tablet (81 mg total) by mouth daily. 90 tablet 3  . atorvastatin (LIPITOR) 80 MG tablet TAKE 1 TABLET BY MOUTH ONCE DAILY AT  6PM  IN  THE  EVENING 90 tablet 3  . blood glucose meter kit and supplies Dispense based on patient and insurance preference. Use up to four  times daily as directed. (FOR ICD-10 E10.9, E11.9). 1 each 0  . carvedilol (COREG) 25 MG tablet Take 1 tablet (25 mg total) by mouth 2 (two) times daily with a meal. 180 tablet 3  . clopidogrel (PLAVIX) 75 MG tablet Take 1 tablet (75 mg total) by mouth daily. 90 tablet 3  . Dulaglutide (TRULICITY) 1.5 ID/7.8EU SOPN Inject 1.5 mg into the skin once a week. 6 mL 5  . JARDIANCE 10 MG TABS tablet Take 1 tablet by mouth once daily 90 tablet 0  . lisinopril (ZESTRIL) 40 MG tablet Take 1 tablet by mouth once daily 90 tablet 1  . sildenafil (VIAGRA) 100 MG tablet Take 0.5-1 tablets (50-100 mg total) by mouth daily as needed for erectile dysfunction. 5 tablet 11  . metFORMIN (GLUCOPHAGE) 1000 MG tablet Take 1 tablet (1,000 mg total) by mouth 2 (two) times daily with a meal. 180 tablet 3   No facility-administered medications prior to visit.    No Known Allergies  ROS Review of Systems Per hpi     Objective:    Physical Exam Constitutional:      General: He is not in acute distress.    Appearance: Normal appearance. He is normal weight. He is not ill-appearing, toxic-appearing or diaphoretic.  Cardiovascular:     Rate and Rhythm: Normal rate and regular rhythm.     Heart sounds: Normal heart sounds. No murmur heard. No friction rub. No gallop.   Pulmonary:     Effort: Pulmonary effort is normal. No respiratory distress.     Breath sounds: Normal breath sounds. No stridor. No wheezing, rhonchi or rales.  Chest:     Chest wall: No tenderness.  Musculoskeletal:        General: No swelling, tenderness, deformity or signs of injury. Normal range of motion.     Right lower leg: No edema.     Left lower leg: No edema.  Neurological:     General: No focal deficit present.     Mental Status: He is alert and oriented to person, place, and time. Mental status is at baseline.  Psychiatric:        Mood and Affect: Mood normal.        Behavior: Behavior normal.        Thought Content: Thought  content normal.        Judgment: Judgment normal.     BP 129/87   Pulse 100   Temp 98 F (36.7 C) (Temporal)   Resp 18   Ht $R'5\' 10"'Yu$  (1.778 m)   Wt 224 lb (101.6 kg)   SpO2 100%   BMI 32.14 kg/m  Wt Readings from Last 3 Encounters:  08/28/20 224 lb (101.6 kg)  08/25/20 225 lb (102.1 kg)  07/15/20 230 lb (104.3 kg)     Health Maintenance Due  Topic Date Due  . OPHTHALMOLOGY EXAM  Never done    There are no preventive care reminders to display for this patient.  Lab Results  Component Value Date   TSH 1.550 03/21/2018   Lab Results  Component Value Date   WBC 10.3 08/25/2020   HGB 13.4 08/25/2020   HCT 39.4 08/25/2020   MCV 89.5 08/25/2020   PLT 206 08/25/2020   Lab Results  Component Value Date   NA 138 08/25/2020   K 3.7 08/25/2020   CO2 25 08/25/2020   GLUCOSE 107 (H) 08/25/2020   BUN 10 08/25/2020   CREATININE 1.03 08/25/2020   BILITOT 0.4 01/13/2020   ALKPHOS 55 01/13/2020   AST 28 01/13/2020   ALT 33 01/13/2020   PROT 7.1 01/13/2020   ALBUMIN 4.8 01/13/2020   CALCIUM 9.5 08/25/2020   ANIONGAP 8 08/25/2020   Lab Results  Component Value Date   CHOL 116 01/13/2020   Lab Results  Component Value Date   HDL 45 01/13/2020   Lab Results  Component Value Date   LDLCALC 55 01/13/2020   Lab Results  Component Value Date   TRIG 80 01/13/2020   Lab Results  Component Value Date   CHOLHDL 2.6 01/13/2020   Lab Results  Component Value Date   HGBA1C 6.6 (A) 07/15/2020      Assessment & Plan:   Problem List Items Addressed This Visit   None   Visit Diagnoses    Acute pain of right shoulder    -  Primary   Relevant Medications   cyclobenzaprine (FLEXERIL) 5 MG tablet   Other Relevant Orders   Ambulatory referral to Physical Therapy      Meds ordered this encounter  Medications  . cyclobenzaprine (FLEXERIL) 5 MG tablet    Sig: Take 1 tablet (5 mg total) by mouth 3 (three) times daily as needed for muscle spasms.    Dispense:  30  tablet    Refill:  1    Order Specific Question:   Supervising Provider    Answer:   Carlota Raspberry, JEFFREY R [[id number]]    Follow-up: No follow-ups on file.   PLAN  Reproducible, no new qualities, continue to suspect msk etiology  Flexeril, continue other conservative measures  Refer to PT  Follow with PCP if pain worsens or fails to improve  Patient encouraged to call clinic with any questions, comments, or concerns.  Maximiano Coss, NP

## 2020-09-05 ENCOUNTER — Other Ambulatory Visit: Payer: Self-pay | Admitting: Emergency Medicine

## 2020-09-05 DIAGNOSIS — I152 Hypertension secondary to endocrine disorders: Secondary | ICD-10-CM

## 2020-09-05 DIAGNOSIS — E1159 Type 2 diabetes mellitus with other circulatory complications: Secondary | ICD-10-CM

## 2020-09-07 NOTE — Telephone Encounter (Signed)
   Spouse calling to request refill for metFORMIN (GLUCOPHAGE) 1000 MG tablet(Expired)  Pharmacy [id number] - [place] ([place]), [place] - [address]

## 2020-09-08 ENCOUNTER — Ambulatory Visit (INDEPENDENT_AMBULATORY_CARE_PROVIDER_SITE_OTHER): Payer: No Typology Code available for payment source | Admitting: Cardiology

## 2020-09-08 ENCOUNTER — Encounter: Payer: Self-pay | Admitting: Cardiology

## 2020-09-08 ENCOUNTER — Other Ambulatory Visit: Payer: Self-pay

## 2020-09-08 VITALS — BP 142/88 | HR 88 | Ht 70.0 in | Wt 225.0 lb

## 2020-09-08 DIAGNOSIS — I2510 Atherosclerotic heart disease of native coronary artery without angina pectoris: Secondary | ICD-10-CM | POA: Diagnosis not present

## 2020-09-08 DIAGNOSIS — E7800 Pure hypercholesterolemia, unspecified: Secondary | ICD-10-CM

## 2020-09-08 DIAGNOSIS — I10 Essential (primary) hypertension: Secondary | ICD-10-CM | POA: Diagnosis not present

## 2020-09-08 NOTE — Patient Instructions (Signed)
Medication Instructions:  Your physician recommends that you continue on your current medications as directed. Please refer to the Current Medication list given to you today. *If you need a refill on your cardiac medications before your next appointment, please call your pharmacy*   Lab Work: None If you have labs (blood work) drawn today and your tests are completely normal, you will receive your results only by: Marland Kitchen MyChart Message (if you have MyChart) OR . A paper copy in the mail If you have any lab test that is abnormal or we need to change your treatment, we will call you to review the results.   Testing/Procedures: None   Follow-Up: At [facility], you and your health needs are our priority.  As part of our continuing mission to provide you with exceptional heart care, we have created designated Provider Care Teams.  These Care Teams include your primary Cardiologist (physician) and Advanced Practice Providers (APPs -  Physician Assistants and Nurse Practitioners) who all work together to provide you with the care you need, when you need it.   Your next appointment:   1 year(s)  The format for your next appointment:   In Person  Provider:   Armanda Magic, MD

## 2020-09-08 NOTE — Progress Notes (Signed)
Cardiology Office Note:    Date:  09/08/2020   ID:  Clifford Frederick, DOB 11-Dec-1965, MRN [id number]  PCP:  Horald Pollen, MD  Cardiologist:  Fransico Him, MD    Referring MD: Horald Pollen, *   Chief Complaint  Patient presents with  . Coronary Artery Disease  . Hypertension  . Hyperlipidemia    History of Present Illness:    Clifford Frederick is a 55 y.o. male with a hx of type 2 DM, hyperlipidemia, HTN, tobacco use and  CAD.  He presented 08/2017 with complaints of sudden onset of SOB while smoking a cigarette at work. Trop was negative.  Cath showed  80% distal RCA and 25% mid to proximal left circumflex and underwent drug-eluting Synergy stent to the RCA.  He had recurrent CP after smoking a cigarette and nuclear stress test was normal.   He was seen in the ER on 08/25/2020 due to chest pain after lifting 25lb metal pieces qith 50 repetitions.  The next morning he woke up with right sided pectoral soreness worse with movement of his arm.  In ER workup was normal and his sx were not felt to be cardiac.    He is here today for followup and is doing well.  His chest wall pain has resolved. He denies any chest pain or pressure, SOB, DOE, PND, orthopnea, LE edema, dizziness, palpitations or syncope. He is compliant with his meds and is tolerating meds with no SE.    Past Medical History:  Diagnosis Date  . Abnormal EKG   . Acute coronary syndrome ([place]) 08/15/2017  . CAD in native artery    A. LHC 08/16/17- DES to RCA  . Chest pain 08/15/2017  . Diabetes mellitus type 2 in obese ([place]) 08/15/2017  . Essential hypertension, benign 08/08/2012  . Fracture of lateral malleolus of left ankle 08/07/2012  . High cholesterol   . Hypertensive urgency 08/15/2017  . Sleep apnea   . Tobacco abuse 08/15/2017  . Unstable angina [facility])     Past Surgical History:  Procedure Laterality Date  . CORONARY STENT INTERVENTION N/A 08/16/2017   Procedure: CORONARY STENT INTERVENTION;  Surgeon: Jettie Booze, MD;  Location: [facility];  Service: Cardiovascular;  Laterality: N/A;  . LEFT HEART CATH AND CORONARY ANGIOGRAPHY N/A 08/16/2017   Procedure: LEFT HEART CATH AND CORONARY ANGIOGRAPHY;  Surgeon: Jettie Booze, MD;  Location: [facility];  Service: Cardiovascular;  Laterality: N/A;  . ORIF ANKLE FRACTURE Left 08/07/2012   Procedure: OPEN REDUCTION INTERNAL FIXATION (ORIF) LEFT ANKLE FRACTURE;  Surgeon: Mcarthur Rossetti, MD;  Location: [facility];  Service: Orthopedics;  Laterality: Left;    Current Medications: Current Meds  Medication Sig  . amLODipine (NORVASC) 5 MG tablet TAKE 1 TABLET BY MOUTH ONCE DAILY -  PLEASE  KEEP  UPCOMING  APPT  IN  MAY  WITH  DR.  Radford Pax  BEFORE  ANY  MORE  REFILLS  . aspirin 81 MG chewable tablet Chew 1 tablet (81 mg total) by mouth daily.  Marland Kitchen atorvastatin (LIPITOR) 80 MG tablet TAKE 1 TABLET BY MOUTH ONCE DAILY AT  6PM  IN  THE  EVENING  . blood glucose meter kit and supplies Dispense based on patient and insurance preference. Use up to four times daily as directed. (FOR ICD-10 E10.9, E11.9).  . carvedilol (COREG) 25 MG tablet Take 1 tablet (25 mg total) by mouth 2 (two) times daily with a meal.  .  clopidogrel (PLAVIX) 75 MG tablet Take 1 tablet (75 mg total) by mouth daily.  . cyclobenzaprine (FLEXERIL) 5 MG tablet Take 1 tablet (5 mg total) by mouth 3 (three) times daily as needed for muscle spasms.  . Dulaglutide (TRULICITY) 1.5 JM/4.2AS SOPN Inject 1.5 mg into the skin once a week.  Marland Kitchen JARDIANCE 10 MG TABS tablet Take 1 tablet by mouth once daily  . lisinopril (ZESTRIL) 40 MG tablet Take 1 tablet by mouth once daily  . metFORMIN (GLUCOPHAGE) 1000 MG tablet TAKE 1 TABLET BY MOUTH TWICE DAILY WITH MEALS  . sildenafil (VIAGRA) 100 MG tablet Take 0.5-1 tablets (50-100 mg total) by mouth daily as needed for erectile dysfunction.     Allergies:   Patient has no known allergies.   Social History   Socioeconomic History  . Marital  status: Married    Spouse name: Not on file  . Number of children: Not on file  . Years of education: Not on file  . Highest education level: Not on file  Occupational History  . Not on file  Tobacco Use  . Smoking status: Current Some Day Smoker    Packs/day: 0.25    Years: 20.00    Pack years: 5.00    Types: Cigarettes    Start date: 08/16/2017  . Smokeless tobacco: Never Used  . Tobacco comment: currently smokes 3 cigarettes a day  Vaping Use  . Vaping Use: Never used  Substance and Sexual Activity  . Alcohol use: Yes    Alcohol/week: 6.0 standard drinks    Types: 6 Cans of beer per week    Comment: occ  . Drug use: No  . Sexual activity: Not on file  Other Topics Concern  . Not on file  Social History Narrative  . Not on file   Social Determinants of Health   Financial Resource Strain: Not on file  Food Insecurity: Not on file  Transportation Needs: Not on file  Physical Activity: Not on file  Stress: Not on file  Social Connections: Not on file     Family History: The patient's family history includes Diabetes in his brother and mother; Hypertension in his brother and mother.  ROS:   Please see the history of present illness.    ROS  All other systems reviewed and negative.   EKGs/Labs/Other Studies Reviewed:    The following studies were reviewed today:  Nuclear stress test 2019  Nuclear stress EF: 50%.  Blood pressure demonstrated a hypertensive response to exercise.  Downsloping ST segment depression of 1 mm was noted during stress in the II, III and aVF leads, beginning at 9 minutes of stress. However, there is T wave inversion in these leads at baseline which limits the specificity of this finding.  The study is normal.  This is a low risk study.  The left ventricular ejection fraction is mildly decreased (45-54%).  2D echo 08/2017 Study Conclusions   - Left ventricle: The cavity size was normal. There was moderate  concentric  hypertrophy. Systolic function was normal. The  estimated ejection fraction was in the range of 60% to 65%. There  is hypokinesis of the apicalinferolateral myocardium. Features  are consistent with a pseudonormal left ventricular filling  pattern, with concomitant abnormal relaxation and increased  filling pressure (grade 2 diastolic dysfunction). Doppler  parameters are consistent with high ventricular filling pressure.  - Aortic valve: Trileaflet; mildly thickened, mildly calcified  leaflets.  - Mitral valve: Calcified annulus. There was trivial regurgitation.  -  Left atrium: The atrium was mildly dilated.  - Pulmonary arteries: Systolic pressure could not be accurately  estimated.   Cardiac Cath 08/2017 Conclusion    Dist RCA lesion is 80% stenosed.  Prox Cx to Mid Cx lesion is 25% stenosed.  There is mild to moderate left ventricular systolic dysfunction.  There is no aortic valve stenosis.  A drug-eluting stent was successfully placed using a STENT SYNERGY DES 3X16.  Post intervention, there is a 0% residual stenosis.   Continue dual antiplatelet therapy with aspirin and Brilinta for 1 year along with aggressive secondary prevention.      Diagnostic Dominance: Right  Intervention        EKG:  EKG is not ordered today.   Recent Labs: 01/13/2020: ALT 33 08/25/2020: BUN 10; Creatinine, Ser 1.03; Hemoglobin 13.4; Platelets 206; Potassium 3.7; Sodium 138   Recent Lipid Panel    Component Value Date/Time   CHOL 116 01/13/2020 1032   TRIG 80 01/13/2020 1032   HDL 45 01/13/2020 1032   CHOLHDL 2.6 01/13/2020 1032   CHOLHDL 5.5 08/16/2017 0344   VLDL 57 (H) 08/16/2017 0344   LDLCALC 55 01/13/2020 1032    Physical Exam:    VS:  BP (!) 142/88   Pulse 88   Ht 5\' 10"  (1.778 m)   Wt 225 lb (102.1 kg)   SpO2 96%   BMI 32.28 kg/m     Wt Readings from Last 3 Encounters:  09/08/20 225 lb (102.1 kg)  08/28/20 224 lb (101.6 kg)  08/25/20 225  lb (102.1 kg)    GEN: Well nourished, well developed in no acute distress HEENT: Normal NECK: No JVD; No carotid bruits LYMPHATICS: No lymphadenopathy CARDIAC:RRR, no murmurs, rubs, gallops RESPIRATORY:  Clear to auscultation without rales, wheezing or rhonchi  ABDOMEN: Soft, non-tender, non-distended MUSCULOSKELETAL:  No edema; No deformity  SKIN: Warm and dry NEUROLOGIC:  Alert and oriented x 3 PSYCHIATRIC:  Normal affect    ASSESSMENT:    1. CAD in native artery   2. Pure hypercholesterolemia   3. Essential hypertension, benign    PLAN:    In order of problems listed above:  1.  ASCAD  - cath showed  80% distal RCA and 25% mid to proximal left circumflex.   -He is status post drug-eluting Synergy stent to the RCA 08/16/2017.   -ETT for atypical chest pain 2021 showed no ischemia -recently in ER with chest wall pain after heaving lifting and this has completely resolved -he has not had any further anginal symptoms -He will continue on ASA 81mg  daily, Plavix 75mg  daily, BB, Jardiance 10mg  daily and statin.   2. Hyperlipidemia  -LDL goal < 70 -LDL was 55 in Aug 2021 -Continue Atorvastatin 80mg  daily.    3.  HTN  -BP is controlled on exam today -continue amlodipine 5mg  daily, Lisinopril 40mg  daily and Carvedilol 25mg  BID  -SCr stable at 1.03 and K+ 3.7 in March 2022   Medication Adjustments/Labs and Tests Ordered: Current medicines are reviewed at length with the patient today.  Concerns regarding medicines are outlined above.  No orders of the defined types were placed in this encounter.  No orders of the defined types were placed in this encounter.   Signed, Fransico Him, MD  09/08/2020 1:37 PM    [facility]

## 2020-10-14 ENCOUNTER — Ambulatory Visit: Payer: No Typology Code available for payment source | Admitting: Emergency Medicine

## 2020-10-14 ENCOUNTER — Encounter: Payer: Self-pay | Admitting: Emergency Medicine

## 2020-10-14 ENCOUNTER — Other Ambulatory Visit: Payer: Self-pay

## 2020-10-14 VITALS — BP 138/80 | HR 100 | Temp 97.7°F | Ht 70.0 in | Wt 221.0 lb

## 2020-10-14 DIAGNOSIS — I2510 Atherosclerotic heart disease of native coronary artery without angina pectoris: Secondary | ICD-10-CM | POA: Diagnosis not present

## 2020-10-14 DIAGNOSIS — E785 Hyperlipidemia, unspecified: Secondary | ICD-10-CM

## 2020-10-14 DIAGNOSIS — E1159 Type 2 diabetes mellitus with other circulatory complications: Secondary | ICD-10-CM

## 2020-10-14 DIAGNOSIS — E1169 Type 2 diabetes mellitus with other specified complication: Secondary | ICD-10-CM | POA: Diagnosis not present

## 2020-10-14 DIAGNOSIS — I152 Hypertension secondary to endocrine disorders: Secondary | ICD-10-CM

## 2020-10-14 DIAGNOSIS — Z720 Tobacco use: Secondary | ICD-10-CM

## 2020-10-14 LAB — POCT GLYCOSYLATED HEMOGLOBIN (HGB A1C): Hemoglobin A1C: 5.9 % — AB (ref 4.0–5.6)

## 2020-10-14 NOTE — Assessment & Plan Note (Signed)
Well-controlled hypertension.  Continue amlodipine 5 mg daily, carvedilol 25 mg twice a day, and lisinopril 40 mg daily. Well-controlled diabetes with hemoglobin A1c of 5.9.  Continue Trulicity 1.5 mg weekly, metformin 1000 mg twice a day and Jardiance 10 mg daily. Diet and nutrition discussed. Follow-up in 6 months.

## 2020-10-14 NOTE — Assessment & Plan Note (Signed)
Stable without episodes of angina.  Continue Plavix and aspirin. Continue selective beta-blocker, statin, Jardiance, ACE inhibitor.

## 2020-10-14 NOTE — Assessment & Plan Note (Signed)
Smoking cessation advice given.  Smoking less and motivated to quit.  Understands cardiovascular risks associated with smoking.

## 2020-10-14 NOTE — Assessment & Plan Note (Signed)
Diet and nutrition discussed.  Continue atorvastatin 80 mg daily. 

## 2020-10-14 NOTE — Progress Notes (Signed)
Clifford Frederick 55 y.o.   Chief Complaint  Patient presents with  . Diabetes    BP and diabete follow up   Most recent office visit with me on February 2022 as follows: ASSESSMENT & PLAN: Hypertension associated with type 2 diabetes mellitus ([place]) Well-controlled hypertension. Continue amlodipine 5 mg, carvedilol 25 mg twice a day and lisinopril 40 mg daily. Hemoglobin A1c at 6.6 little higher than before. Continue Metformin 1000 mg twice a day, Jardiance 10 mg daily. Will increase weekly Trulicity to 1.5 mg. Diet and nutrition discussed. Follow-up in 3 to 6 months. Most recent cardiology visit on 09/08/2020 with Dr. Radford Pax as follows: ASSESSMENT:     1. CAD in native artery   2. Pure hypercholesterolemia   3. Essential hypertension, benign     PLAN:     In order of problems listed above:   1.  ASCAD  - cath showed  80% distal RCA and 25% mid to proximal left circumflex.   -He is status post drug-eluting Synergy stent to the RCA 08/16/2017.   -ETT for atypical chest pain 2021 showed no ischemia -recently in ER with chest wall pain after heaving lifting and this has completely resolved -he has not had any further anginal symptoms -He will continue on ASA 81mg  daily, Plavix 75mg  daily, BB, Jardiance 10mg  daily and statin.    2. Hyperlipidemia  -LDL goal < 70 -LDL was 55 in Aug 2021 -Continue Atorvastatin 80mg  daily.     3.  HTN  -BP is controlled on exam today -continue amlodipine 5mg  daily, Lisinopril 40mg  daily and Carvedilol 25mg  BID  -SCr stable at 1.03 and K+ 3.7 in March 2022    HISTORY OF PRESENT ILLNESS: This is a 55 y.o. male with history of diabetes, hypertension and dyslipidemia here for follow-up.  Doing well.  Smoking less.  Compliant with medications.  Eating better.  Has no complaints or medical concerns. 1.  Hypertension on amlodipine 5 mg daily, carvedilol 25 mg twice a day, and lisinopril 40 mg daily. 2.  Diabetes: On Trulicity 1.5 mg weekly, metformin  1000 mg twice a day and Jardiance 10 mg daily. 3.  Dyslipidemia: On Lipitor 80 mg daily. 4.  History of CAD with stent placement.  Presently on Plavix and aspirin  HPI   Prior to Admission medications   Medication Sig Start Date End Date Taking? Authorizing Provider  amLODipine (NORVASC) 5 MG tablet TAKE 1 TABLET BY MOUTH ONCE DAILY -  PLEASE  KEEP  UPCOMING  APPT  IN  MAY  WITH  DR.  Radford Pax  BEFORE  ANY  MORE  REFILLS 11/13/19  Yes Turner, Eber Hong, MD  aspirin 81 MG chewable tablet Chew 1 tablet (81 mg total) by mouth daily. 08/18/17  Yes Daune Perch, NP  atorvastatin (LIPITOR) 80 MG tablet TAKE 1 TABLET BY MOUTH ONCE DAILY AT  6PM  IN  THE  EVENING 11/12/19  Yes Stratton Villwock, Ines Bloomer, MD  blood glucose meter kit and supplies Dispense based on patient and insurance preference. Use up to four times daily as directed. (FOR ICD-10 E10.9, E11.9). 06/04/18  Yes Posey Boyer, MD  carvedilol (COREG) 25 MG tablet Take 1 tablet (25 mg total) by mouth 2 (two) times daily with a meal. 11/27/19  Yes Turner, Eber Hong, MD  clopidogrel (PLAVIX) 75 MG tablet Take 1 tablet (75 mg total) by mouth daily. 11/27/19  Yes Turner, Eber Hong, MD  cyclobenzaprine (FLEXERIL) 5 MG tablet Take 1 tablet (5  mg total) by mouth 3 (three) times daily as needed for muscle spasms. 08/28/20  Yes Maximiano Coss, NP  Dulaglutide (TRULICITY) 1.5 IP/3.8SN SOPN Inject 1.5 mg into the skin once a week. 07/15/20  Yes Kramer Hanrahan, Ines Bloomer, MD  JARDIANCE 10 MG TABS tablet Take 1 tablet by mouth once daily 06/20/20  Yes Capri Raben, [place], MD  lisinopril (ZESTRIL) 40 MG tablet Take 1 tablet by mouth once daily 05/27/20  Yes Kutler Vanvranken, Ines Bloomer, MD  metFORMIN (GLUCOPHAGE) 1000 MG tablet TAKE 1 TABLET BY MOUTH TWICE DAILY WITH MEALS 09/07/20  Yes Anisa Leanos, Ines Bloomer, MD  sildenafil (VIAGRA) 100 MG tablet Take 0.5-1 tablets (50-100 mg total) by mouth daily as needed for erectile dysfunction. 01/13/20  Yes SagardiaInes Bloomer, MD    No Known  Allergies  Patient Active Problem List   Diagnosis Date Noted  . Hypertension associated with type 2 diabetes mellitus ([place]) 07/17/2019  . CAD in native artery   . Pure hypercholesterolemia   . Tobacco abuse 08/15/2017  . Dyslipidemia associated with type 2 diabetes mellitus ([place]) 08/15/2017  . Normocytic anemia 08/15/2017  . Essential hypertension, benign 08/08/2012    Past Medical History:  Diagnosis Date  . Abnormal EKG   . Acute coronary syndrome ([place]) 08/15/2017  . CAD in native artery    A. LHC 08/16/17- DES to RCA  . Chest pain 08/15/2017  . Diabetes mellitus type 2 in obese ([place]) 08/15/2017  . Essential hypertension, benign 08/08/2012  . Fracture of lateral malleolus of left ankle 08/07/2012  . High cholesterol   . Hypertensive urgency 08/15/2017  . Sleep apnea   . Tobacco abuse 08/15/2017  . Unstable angina [facility])     Past Surgical History:  Procedure Laterality Date  . CORONARY STENT INTERVENTION N/A 08/16/2017   Procedure: CORONARY STENT INTERVENTION;  Surgeon: Jettie Booze, MD;  Location: [place] [facility];  Service: Cardiovascular;  Laterality: N/A;  . LEFT HEART CATH AND CORONARY ANGIOGRAPHY N/A 08/16/2017   Procedure: LEFT HEART CATH AND CORONARY ANGIOGRAPHY;  Surgeon: Jettie Booze, MD;  Location: [facility] [place] [facility];  Service: Cardiovascular;  Laterality: N/A;  . ORIF ANKLE FRACTURE Left 08/07/2012   Procedure: OPEN REDUCTION INTERNAL FIXATION (ORIF) LEFT ANKLE FRACTURE;  Surgeon: Mcarthur Rossetti, MD;  Location: [facility];  Service: Orthopedics;  Laterality: Left;    Social History   Socioeconomic History  . Marital status: Married    Spouse name: Not on file  . Number of children: Not on file  . Years of education: Not on file  . Highest education level: Not on file  Occupational History  . Not on file  Tobacco Use  . Smoking status: Current Some Day Smoker    Packs/day: 0.25    Years: 20.00    Pack years: 5.00    Types: Cigarettes    Start  date: 08/16/2017  . Smokeless tobacco: Never Used  . Tobacco comment: currently smokes 3 cigarettes a day  Vaping Use  . Vaping Use: Never used  Substance and Sexual Activity  . Alcohol use: Yes    Alcohol/week: 6.0 standard drinks    Types: 6 Cans of beer per week    Comment: occ  . Drug use: No  . Sexual activity: Not on file  Other Topics Concern  . Not on file  Social History Narrative  . Not on file   Social Determinants of Health   Financial Resource Strain: Not on file  Food Insecurity: Not  on file  Transportation Needs: Not on file  Physical Activity: Not on file  Stress: Not on file  Social Connections: Not on file  Intimate Partner Violence: Not on file    Family History  Problem Relation Age of Onset  . Diabetes Mother   . Hypertension Mother   . Diabetes Brother   . Hypertension Brother      Review of Systems  Constitutional: Negative.  Negative for chills and fever.  HENT: Negative.  Negative for congestion and sore throat.   Respiratory: Negative.  Negative for cough and shortness of breath.   Cardiovascular: Negative.  Negative for chest pain and palpitations.  Gastrointestinal: Negative for nausea and vomiting.  Genitourinary: Negative.  Negative for dysuria and hematuria.  Skin: Negative.  Negative for rash.  Neurological: Negative.  Negative for dizziness and headaches.  All other systems reviewed and are negative.   Today's Vitals   10/14/20 0836  BP: (!) 160/82  Pulse: 100  Temp: 97.7 F (36.5 C)  TempSrc: Oral  SpO2: 98%  Weight: 221 lb (100.2 kg)  Height: $Remove'5\' 10"'wuUWvHt$  (1.778 m)   Body mass index is 31.71 kg/m. BP Readings from Last 3 Encounters:  10/14/20 (!) 160/82  09/08/20 (!) 142/88  08/28/20 129/87    Physical Exam Vitals reviewed.  Constitutional:      Appearance: Normal appearance.  HENT:     Head: Normocephalic.  Eyes:     Extraocular Movements: Extraocular movements intact.     Conjunctiva/sclera: Conjunctivae normal.      Pupils: Pupils are equal, round, and reactive to light.  Cardiovascular:     Rate and Rhythm: Normal rate and regular rhythm.     Pulses: Normal pulses.     Heart sounds: Normal heart sounds.  Pulmonary:     Effort: Pulmonary effort is normal.     Breath sounds: Normal breath sounds.  Musculoskeletal:     Cervical back: Normal range of motion and neck supple.     Right lower leg: No edema.     Left lower leg: No edema.  Skin:    General: Skin is warm and dry.     Capillary Refill: Capillary refill takes less than 2 seconds.  Neurological:     General: No focal deficit present.     Mental Status: He is alert and oriented to person, place, and time.  Psychiatric:        Mood and Affect: Mood normal.        Behavior: Behavior normal.      Results for orders placed or performed in visit on 10/14/20 (from the past 24 hour(s))  POCT glycosylated hemoglobin (Hb A1C)     Status: Abnormal   Collection Time: 10/14/20  8:45 AM  Result Value Ref Range   Hemoglobin A1C 5.9 (A) 4.0 - 5.6 %   HbA1c POC (<> result, manual entry)     HbA1c, POC (prediabetic range)     HbA1c, POC (controlled diabetic range)       ASSESSMENT & PLAN: A total of 30 minutes was spent with the patient and counseling/coordination of care regarding hypertension, dyslipidemia, diabetes and cardiovascular risks associated with these conditions, review of all medications, review of most recent blood work results including today's hemoglobin A1c, education and nutrition, review of most recent office visit note, review of most recent cardiology office visit note, health maintenance items, documentation, prognosis, and need for follow-up.  Hypertension associated with type 2 diabetes mellitus ([place]) Well-controlled hypertension.  Continue  amlodipine 5 mg daily, carvedilol 25 mg twice a day, and lisinopril 40 mg daily. Well-controlled diabetes with hemoglobin A1c of 5.9.  Continue Trulicity 1.5 mg weekly, metformin 1000  mg twice a day and Jardiance 10 mg daily. Diet and nutrition discussed. Follow-up in 6 months.  Dyslipidemia associated with type 2 diabetes mellitus ([place]) Diet and nutrition discussed.  Continue atorvastatin 80 mg daily.  CAD in native artery Stable without episodes of angina.  Continue Plavix and aspirin. Continue selective beta-blocker, statin, Jardiance, ACE inhibitor.  Tobacco abuse Smoking cessation advice given.  Smoking less and motivated to quit.  Understands cardiovascular risks associated with smoking.  Kache was seen today for diabetes.  Diagnoses and all orders for this visit:  Hypertension associated with type 2 diabetes mellitus ([place]) -     POCT glycosylated hemoglobin (Hb A1C)  Dyslipidemia associated with type 2 diabetes mellitus ([facility])  CAD in native artery  Tobacco abuse    Patient Instructions   Steps to Quit Smoking Smoking tobacco is the leading cause of preventable death. It can affect almost every organ in the body. Smoking puts you and people around you at risk for many serious, long-lasting (chronic) diseases. Quitting smoking can be hard, but it is one of the best things that you can do for your health. It is never too late to quit. How do I get ready to quit? When you decide to quit smoking, make a plan to help you succeed. Before you quit:  Pick a date to quit. Set a date within the next 2 weeks to give you time to prepare.  Write down the reasons why you are quitting. Keep this list in places where you will see it often.  Tell your family, friends, and co-workers that you are quitting. Their support is important.  Talk with your doctor about the choices that may help you quit.  Find out if your health insurance will pay for these treatments.  Know the people, places, things, and activities that make you want to smoke (triggers). Avoid them. What first steps can I take to quit smoking?  Throw away all cigarettes at home, at work, and in  your car.  Throw away the things that you use when you smoke, such as ashtrays and lighters.  Clean your car. Make sure to empty the ashtray.  Clean your home, including curtains and carpets. What can I do to help me quit smoking? Talk with your doctor about taking medicines and seeing a counselor at the same time. You are more likely to succeed when you do both.  If you are pregnant or breastfeeding, talk with your doctor about counseling or other ways to quit smoking. Do not take medicine to help you quit smoking unless your doctor tells you to do so. To quit smoking: Quit right away  Quit smoking totally, instead of slowly cutting back on how much you smoke over a period of time.  Go to counseling. You are more likely to quit if you go to counseling sessions regularly. Take medicine You may take medicines to help you quit. Some medicines need a prescription, and some you can buy over-the-counter. Some medicines may contain a drug called nicotine to replace the nicotine in cigarettes. Medicines may:  Help you to stop having the desire to smoke (cravings).  Help to stop the problems that come when you stop smoking (withdrawal symptoms). Your doctor may ask you to use:  Nicotine patches, gum, or lozenges.  Nicotine inhalers or  sprays.  Non-nicotine medicine that is taken by mouth. Find resources Find resources and other ways to help you quit smoking and remain smoke-free after you quit. These resources are most helpful when you use them often. They include:  Online chats with a Social worker.  Phone quitlines.  Printed Furniture conservator/restorer.  Support groups or group counseling.  Text messaging programs.  Mobile phone apps. Use apps on your mobile phone or tablet that can help you stick to your quit plan. There are many free apps for mobile phones and tablets as well as websites. Examples include Quit Guide from the [employer] and smokefree.gov   What things can I do to make it easier  to quit?  Talk to your family and friends. Ask them to support and encourage you.  Call a phone quitline (1-800-QUIT-NOW), reach out to support groups, or work with a Social worker.  Ask people who smoke to not smoke around you.  Avoid places that make you want to smoke, such as: ? Bars. ? Parties. ? Smoke-break areas at work.  Spend time with people who do not smoke.  Lower the stress in your life. Stress can make you want to smoke. Try these things to help your stress: ? Getting regular exercise. ? Doing deep-breathing exercises. ? Doing yoga. ? Meditating. ? Doing a body scan. To do this, close your eyes, focus on one area of your body at a time from head to toe. Notice which parts of your body are tense. Try to relax the muscles in those areas.   How will I feel when I quit smoking? Day 1 to 3 weeks Within the first 24 hours, you may start to have some problems that come from quitting tobacco. These problems are very bad 2-3 days after you quit, but they do not often last for more than 2-3 weeks. You may get these symptoms:  Mood swings.  Feeling restless, nervous, angry, or annoyed.  Trouble concentrating.  Dizziness.  Strong desire for high-sugar foods and nicotine.  Weight gain.  Trouble pooping (constipation).  Feeling like you may vomit (nausea).  Coughing or a sore throat.  Changes in how the medicines that you take for other issues work in your body.  Depression.  Trouble sleeping (insomnia). Week 3 and afterward After the first 2-3 weeks of quitting, you may start to notice more positive results, such as:  Better sense of smell and taste.  Less coughing and sore throat.  Slower heart rate.  Lower blood pressure.  Clearer skin.  Better breathing.  Fewer sick days. Quitting smoking can be hard. Do not give up if you fail the first time. Some people need to try a few times before they succeed. Do your best to stick to your quit plan, and talk with  your doctor if you have any questions or concerns. Summary  Smoking tobacco is the leading cause of preventable death. Quitting smoking can be hard, but it is one of the best things that you can do for your health.  When you decide to quit smoking, make a plan to help you succeed.  Quit smoking right away, not slowly over a period of time.  When you start quitting, seek help from your doctor, family, or friends. This information is not intended to replace advice given to you by your health care provider. Make sure you discuss any questions you have with your health care provider. Document Revised: 02/15/2019 Document Reviewed: 08/11/2018 Elsevier Patient Education  [place]. Hypertension,  Adult High blood pressure (hypertension) is when the force of blood pumping through the arteries is too strong. The arteries are the blood vessels that carry blood from the heart throughout the body. Hypertension forces the heart to work harder to pump blood and may cause arteries to become narrow or stiff. Untreated or uncontrolled hypertension can cause a heart attack, heart failure, a stroke, kidney disease, and other problems. A blood pressure reading consists of a higher number over a lower number. Ideally, your blood pressure should be below 120/80. The first ("top") number is called the systolic pressure. It is a measure of the pressure in your arteries as your heart beats. The second ("bottom") number is called the diastolic pressure. It is a measure of the pressure in your arteries as the heart relaxes. What are the causes? The exact cause of this condition is not known. There are some conditions that result in or are related to high blood pressure. What increases the risk? Some risk factors for high blood pressure are under your control. The following factors may make you more likely to develop this condition:  Smoking.  Having type 2 diabetes mellitus, high cholesterol, or both.  Not  getting enough exercise or physical activity.  Being overweight.  Having too much fat, sugar, calories, or salt (sodium) in your diet.  Drinking too much alcohol. Some risk factors for high blood pressure may be difficult or impossible to change. Some of these factors include:  Having chronic kidney disease.  Having a family history of high blood pressure.  Age. Risk increases with age.  Race. You may be at higher risk if you are African American.  Gender. Men are at higher risk than women before age 82. After age 23, women are at higher risk than men.  Having obstructive sleep apnea.  Stress. What are the signs or symptoms? High blood pressure may not cause symptoms. Very high blood pressure (hypertensive crisis) may cause:  Headache.  Anxiety.  Shortness of breath.  Nosebleed.  Nausea and vomiting.  Vision changes.  Severe chest pain.  Seizures. How is this diagnosed? This condition is diagnosed by measuring your blood pressure while you are seated, with your arm resting on a flat surface, your legs uncrossed, and your feet flat on the floor. The cuff of the blood pressure monitor will be placed directly against the skin of your upper arm at the level of your heart. It should be measured at least twice using the same arm. Certain conditions can cause a difference in blood pressure between your right and left arms. Certain factors can cause blood pressure readings to be lower or higher than normal for a short period of time:  When your blood pressure is higher when you are in a health care provider's office than when you are at home, this is called white coat hypertension. Most people with this condition do not need medicines.  When your blood pressure is higher at home than when you are in a health care provider's office, this is called masked hypertension. Most people with this condition may need medicines to control blood pressure. If you have a high blood pressure  reading during one visit or you have normal blood pressure with other risk factors, you may be asked to:  Return on a different day to have your blood pressure checked again.  Monitor your blood pressure at home for 1 week or longer. If you are diagnosed with hypertension, you may have other blood or  imaging tests to help your health care provider understand your overall risk for other conditions. How is this treated? This condition is treated by making healthy lifestyle changes, such as eating healthy foods, exercising more, and reducing your alcohol intake. Your health care provider may prescribe medicine if lifestyle changes are not enough to get your blood pressure under control, and if:  Your systolic blood pressure is above 130.  Your diastolic blood pressure is above 80. Your personal target blood pressure may vary depending on your medical conditions, your age, and other factors. Follow these instructions at home: Eating and drinking  Eat a diet that is high in fiber and potassium, and low in sodium, added sugar, and fat. An example eating plan is called the DASH (Dietary Approaches to Stop Hypertension) diet. To eat this way: ? Eat plenty of fresh fruits and vegetables. Try to fill one half of your plate at each meal with fruits and vegetables. ? Eat whole grains, such as whole-wheat pasta, brown rice, or whole-grain bread. Fill about one fourth of your plate with whole grains. ? Eat or drink low-fat dairy products, such as skim milk or low-fat yogurt. ? Avoid fatty cuts of meat, processed or cured meats, and poultry with skin. Fill about one fourth of your plate with lean proteins, such as fish, chicken without skin, beans, eggs, or tofu. ? Avoid pre-made and processed foods. These tend to be higher in sodium, added sugar, and fat.  Reduce your daily sodium intake. Most people with hypertension should eat less than 1,500 mg of sodium a day.  Do not drink alcohol if: ? Your health  care provider tells you not to drink. ? You are pregnant, may be pregnant, or are planning to become pregnant.  If you drink alcohol: ? Limit how much you use to:  0-1 drink a day for women.  0-2 drinks a day for men. ? Be aware of how much alcohol is in your drink. In the [place] one drink equals one 12 oz bottle of beer (355 mL), one 5 oz glass of wine (148 mL), or one 1 oz glass of hard liquor (44 mL).   Lifestyle  Work with your health care provider to maintain a healthy body weight or to lose weight. Ask what an ideal weight is for you.  Get at least 30 minutes of exercise most days of the week. Activities may include walking, swimming, or biking.  Include exercise to strengthen your muscles (resistance exercise), such as Pilates or lifting weights, as part of your weekly exercise routine. Try to do these types of exercises for 30 minutes at least 3 days a week.  Do not use any products that contain nicotine or tobacco, such as cigarettes, e-cigarettes, and chewing tobacco. If you need help quitting, ask your health care provider.  Monitor your blood pressure at home as told by your health care provider.  Keep all follow-up visits as told by your health care provider. This is important.   Medicines  Take over-the-counter and prescription medicines only as told by your health care provider. Follow directions carefully. Blood pressure medicines must be taken as prescribed.  Do not skip doses of blood pressure medicine. Doing this puts you at risk for problems and can make the medicine less effective.  Ask your health care provider about side effects or reactions to medicines that you should watch for. Contact a health care provider if you:  Think you are having a reaction to a medicine  you are taking.  Have headaches that keep coming back (recurring).  Feel dizzy.  Have swelling in your ankles.  Have trouble with your vision. Get help right away if you:  Develop a severe  headache or confusion.  Have unusual weakness or numbness.  Feel faint.  Have severe pain in your chest or abdomen.  Vomit repeatedly.  Have trouble breathing. Summary  Hypertension is when the force of blood pumping through your arteries is too strong. If this condition is not controlled, it may put you at risk for serious complications.  Your personal target blood pressure may vary depending on your medical conditions, your age, and other factors. For most people, a normal blood pressure is less than 120/80.  Hypertension is treated with lifestyle changes, medicines, or a combination of both. Lifestyle changes include losing weight, eating a healthy, low-sodium diet, exercising more, and limiting alcohol. This information is not intended to replace advice given to you by your health care provider. Make sure you discuss any questions you have with your health care provider. Document Revised: 01/31/2018 Document Reviewed: 01/31/2018 Elsevier Patient Education  2021 [place], [place] [facility]

## 2020-10-14 NOTE — Patient Instructions (Signed)
Steps to Quit Smoking Smoking tobacco is the leading cause of preventable death. It can affect almost every organ in the body. Smoking puts you and people around you at risk for many serious, long-lasting (chronic) diseases. Quitting smoking can be hard, but it is one of the best things that you can do for your health. It is never too late to quit. How do I get ready to quit? When you decide to quit smoking, make a plan to help you succeed. Before you quit:  Pick a date to quit. Set a date within the next 2 weeks to give you time to prepare.  Write down the reasons why you are quitting. Keep this list in places where you will see it often.  Tell your family, friends, and co-workers that you are quitting. Their support is important.  Talk with your doctor about the choices that may help you quit.  Find out if your health insurance will pay for these treatments.  Know the people, places, things, and activities that make you want to smoke (triggers). Avoid them. What first steps can I take to quit smoking?  Throw away all cigarettes at home, at work, and in your car.  Throw away the things that you use when you smoke, such as ashtrays and lighters.  Clean your car. Make sure to empty the ashtray.  Clean your home, including curtains and carpets. What can I do to help me quit smoking? Talk with your doctor about taking medicines and seeing a counselor at the same time. You are more likely to succeed when you do both.  If you are pregnant or breastfeeding, talk with your doctor about counseling or other ways to quit smoking. Do not take medicine to help you quit smoking unless your doctor tells you to do so. To quit smoking: Quit right away  Quit smoking totally, instead of slowly cutting back on how much you smoke over a period of time.  Go to counseling. You are more likely to quit if you go to counseling sessions regularly. Take medicine You may take medicines to help you quit. Some  medicines need a prescription, and some you can buy over-the-counter. Some medicines may contain a drug called nicotine to replace the nicotine in cigarettes. Medicines may:  Help you to stop having the desire to smoke (cravings).  Help to stop the problems that come when you stop smoking (withdrawal symptoms). Your doctor may ask you to use:  Nicotine patches, gum, or lozenges.  Nicotine inhalers or sprays.  Non-nicotine medicine that is taken by mouth. Find resources Find resources and other ways to help you quit smoking and remain smoke-free after you quit. These resources are most helpful when you use them often. They include:  Online chats with a counselor.  Phone quitlines.  Printed self-help materials.  Support groups or group counseling.  Text messaging programs.  Mobile phone apps. Use apps on your mobile phone or tablet that can help you stick to your quit plan. There are many free apps for mobile phones and tablets as well as websites. Examples include Quit Guide from the CDC and smokefree.gov   What things can I do to make it easier to quit?  Talk to your family and friends. Ask them to support and encourage you.  Call a phone quitline (1-800-QUIT-NOW), reach out to support groups, or work with a counselor.  Ask people who smoke to not smoke around you.  Avoid places that make you want to smoke,   such as: ? Bars. ? Parties. ? Smoke-break areas at work.  Spend time with people who do not smoke.  Lower the stress in your life. Stress can make you want to smoke. Try these things to help your stress: ? Getting regular exercise. ? Doing deep-breathing exercises. ? Doing yoga. ? Meditating. ? Doing a body scan. To do this, close your eyes, focus on one area of your body at a time from head to toe. Notice which parts of your body are tense. Try to relax the muscles in those areas.   How will I feel when I quit smoking? Day 1 to 3 weeks Within the first 24 hours,  you may start to have some problems that come from quitting tobacco. These problems are very bad 2-3 days after you quit, but they do not often last for more than 2-3 weeks. You may get these symptoms:  Mood swings.  Feeling restless, nervous, angry, or annoyed.  Trouble concentrating.  Dizziness.  Strong desire for high-sugar foods and nicotine.  Weight gain.  Trouble pooping (constipation).  Feeling like you may vomit (nausea).  Coughing or a sore throat.  Changes in how the medicines that you take for other issues work in your body.  Depression.  Trouble sleeping (insomnia). Week 3 and afterward After the first 2-3 weeks of quitting, you may start to notice more positive results, such as:  Better sense of smell and taste.  Less coughing and sore throat.  Slower heart rate.  Lower blood pressure.  Clearer skin.  Better breathing.  Fewer sick days. Quitting smoking can be hard. Do not give up if you fail the first time. Some people need to try a few times before they succeed. Do your best to stick to your quit plan, and talk with your doctor if you have any questions or concerns. Summary  Smoking tobacco is the leading cause of preventable death. Quitting smoking can be hard, but it is one of the best things that you can do for your health.  When you decide to quit smoking, make a plan to help you succeed.  Quit smoking right away, not slowly over a period of time.  When you start quitting, seek help from your doctor, family, or friends. This information is not intended to replace advice given to you by your health care provider. Make sure you discuss any questions you have with your health care provider. Document Revised: 02/15/2019 Document Reviewed: 08/11/2018 Elsevier Patient Education  2021 [employer] Hypertension, Adult High blood pressure (hypertension) is when the force of blood pumping through the arteries is too strong. The arteries are the blood  vessels that carry blood from the heart throughout the body. Hypertension forces the heart to work harder to pump blood and may cause arteries to become narrow or stiff. Untreated or uncontrolled hypertension can cause a heart attack, heart failure, a stroke, kidney disease, and other problems. A blood pressure reading consists of a higher number over a lower number. Ideally, your blood pressure should be below 120/80. The first ("top") number is called the systolic pressure. It is a measure of the pressure in your arteries as your heart beats. The second ("bottom") number is called the diastolic pressure. It is a measure of the pressure in your arteries as the heart relaxes. What are the causes? The exact cause of this condition is not known. There are some conditions that result in or are related to high blood pressure. What increases the risk?  Some risk factors for high blood pressure are under your control. The following factors may make you more likely to develop this condition:  Smoking.  Having type 2 diabetes mellitus, high cholesterol, or both.  Not getting enough exercise or physical activity.  Being overweight.  Having too much fat, sugar, calories, or salt (sodium) in your diet.  Drinking too much alcohol. Some risk factors for high blood pressure may be difficult or impossible to change. Some of these factors include:  Having chronic kidney disease.  Having a family history of high blood pressure.  Age. Risk increases with age.  Race. You may be at higher risk if you are African American.  Gender. Men are at higher risk than women before age 55. After age 88, women are at higher risk than men.  Having obstructive sleep apnea.  Stress. What are the signs or symptoms? High blood pressure may not cause symptoms. Very high blood pressure (hypertensive crisis) may cause:  Headache.  Anxiety.  Shortness of breath.  Nosebleed.  Nausea and vomiting.  Vision  changes.  Severe chest pain.  Seizures. How is this diagnosed? This condition is diagnosed by measuring your blood pressure while you are seated, with your arm resting on a flat surface, your legs uncrossed, and your feet flat on the floor. The cuff of the blood pressure monitor will be placed directly against the skin of your upper arm at the level of your heart. It should be measured at least twice using the same arm. Certain conditions can cause a difference in blood pressure between your right and left arms. Certain factors can cause blood pressure readings to be lower or higher than normal for a short period of time:  When your blood pressure is higher when you are in a health care provider's office than when you are at home, this is called white coat hypertension. Most people with this condition do not need medicines.  When your blood pressure is higher at home than when you are in a health care provider's office, this is called masked hypertension. Most people with this condition may need medicines to control blood pressure. If you have a high blood pressure reading during one visit or you have normal blood pressure with other risk factors, you may be asked to:  Return on a different day to have your blood pressure checked again.  Monitor your blood pressure at home for 1 week or longer. If you are diagnosed with hypertension, you may have other blood or imaging tests to help your health care provider understand your overall risk for other conditions. How is this treated? This condition is treated by making healthy lifestyle changes, such as eating healthy foods, exercising more, and reducing your alcohol intake. Your health care provider may prescribe medicine if lifestyle changes are not enough to get your blood pressure under control, and if:  Your systolic blood pressure is above 130.  Your diastolic blood pressure is above 80. Your personal target blood pressure may vary depending  on your medical conditions, your age, and other factors. Follow these instructions at home: Eating and drinking  Eat a diet that is high in fiber and potassium, and low in sodium, added sugar, and fat. An example eating plan is called the DASH (Dietary Approaches to Stop Hypertension) diet. To eat this way: ? Eat plenty of fresh fruits and vegetables. Try to fill one half of your plate at each meal with fruits and vegetables. ? Eat whole grains, such  as whole-wheat pasta, brown rice, or whole-grain bread. Fill about one fourth of your plate with whole grains. ? Eat or drink low-fat dairy products, such as skim milk or low-fat yogurt. ? Avoid fatty cuts of meat, processed or cured meats, and poultry with skin. Fill about one fourth of your plate with lean proteins, such as fish, chicken without skin, beans, eggs, or tofu. ? Avoid pre-made and processed foods. These tend to be higher in sodium, added sugar, and fat.  Reduce your daily sodium intake. Most people with hypertension should eat less than 1,500 mg of sodium a day.  Do not drink alcohol if: ? Your health care provider tells you not to drink. ? You are pregnant, may be pregnant, or are planning to become pregnant.  If you drink alcohol: ? Limit how much you use to:  0-1 drink a day for women.  0-2 drinks a day for men. ? Be aware of how much alcohol is in your drink. In the [place] one drink equals one 12 oz bottle of beer (355 mL), one 5 oz glass of wine (148 mL), or one 1 oz glass of hard liquor (44 mL).   Lifestyle  Work with your health care provider to maintain a healthy body weight or to lose weight. Ask what an ideal weight is for you.  Get at least 30 minutes of exercise most days of the week. Activities may include walking, swimming, or biking.  Include exercise to strengthen your muscles (resistance exercise), such as Pilates or lifting weights, as part of your weekly exercise routine. Try to do these types of exercises  for 30 minutes at least 3 days a week.  Do not use any products that contain nicotine or tobacco, such as cigarettes, e-cigarettes, and chewing tobacco. If you need help quitting, ask your health care provider.  Monitor your blood pressure at home as told by your health care provider.  Keep all follow-up visits as told by your health care provider. This is important.   Medicines  Take over-the-counter and prescription medicines only as told by your health care provider. Follow directions carefully. Blood pressure medicines must be taken as prescribed.  Do not skip doses of blood pressure medicine. Doing this puts you at risk for problems and can make the medicine less effective.  Ask your health care provider about side effects or reactions to medicines that you should watch for. Contact a health care provider if you:  Think you are having a reaction to a medicine you are taking.  Have headaches that keep coming back (recurring).  Feel dizzy.  Have swelling in your ankles.  Have trouble with your vision. Get help right away if you:  Develop a severe headache or confusion.  Have unusual weakness or numbness.  Feel faint.  Have severe pain in your chest or abdomen.  Vomit repeatedly.  Have trouble breathing. Summary  Hypertension is when the force of blood pumping through your arteries is too strong. If this condition is not controlled, it may put you at risk for serious complications.  Your personal target blood pressure may vary depending on your medical conditions, your age, and other factors. For most people, a normal blood pressure is less than 120/80.  Hypertension is treated with lifestyle changes, medicines, or a combination of both. Lifestyle changes include losing weight, eating a healthy, low-sodium diet, exercising more, and limiting alcohol. This information is not intended to replace advice given to you by your health care provider.  Make sure you discuss any  questions you have with your health care provider. Document Revised: 01/31/2018 Document Reviewed: 01/31/2018 Elsevier Patient Education  2021 [employer]

## 2020-10-25 ENCOUNTER — Other Ambulatory Visit: Payer: Self-pay | Admitting: Emergency Medicine

## 2020-11-22 ENCOUNTER — Other Ambulatory Visit: Payer: Self-pay | Admitting: Emergency Medicine

## 2020-11-22 ENCOUNTER — Other Ambulatory Visit: Payer: Self-pay | Admitting: Cardiology

## 2020-11-22 DIAGNOSIS — I10 Essential (primary) hypertension: Secondary | ICD-10-CM

## 2020-12-12 ENCOUNTER — Other Ambulatory Visit: Payer: Self-pay | Admitting: Emergency Medicine

## 2020-12-12 DIAGNOSIS — E1159 Type 2 diabetes mellitus with other circulatory complications: Secondary | ICD-10-CM

## 2020-12-12 DIAGNOSIS — I152 Hypertension secondary to endocrine disorders: Secondary | ICD-10-CM

## 2020-12-31 ENCOUNTER — Other Ambulatory Visit: Payer: Self-pay | Admitting: Cardiology

## 2021-01-07 ENCOUNTER — Other Ambulatory Visit: Payer: Self-pay | Admitting: Cardiology

## 2021-01-31 ENCOUNTER — Other Ambulatory Visit: Payer: Self-pay | Admitting: Emergency Medicine

## 2021-01-31 DIAGNOSIS — I10 Essential (primary) hypertension: Secondary | ICD-10-CM

## 2021-02-15 ENCOUNTER — Other Ambulatory Visit: Payer: Self-pay | Admitting: Emergency Medicine

## 2021-03-26 ENCOUNTER — Other Ambulatory Visit: Payer: Self-pay | Admitting: Emergency Medicine

## 2021-03-26 DIAGNOSIS — E1159 Type 2 diabetes mellitus with other circulatory complications: Secondary | ICD-10-CM

## 2021-04-19 ENCOUNTER — Encounter: Payer: Self-pay | Admitting: Emergency Medicine

## 2021-04-19 ENCOUNTER — Ambulatory Visit (INDEPENDENT_AMBULATORY_CARE_PROVIDER_SITE_OTHER): Payer: No Typology Code available for payment source | Admitting: Emergency Medicine

## 2021-04-19 ENCOUNTER — Other Ambulatory Visit: Payer: Self-pay

## 2021-04-19 VITALS — BP 128/88 | HR 98 | Temp 98.4°F | Ht 70.0 in | Wt 221.0 lb

## 2021-04-19 DIAGNOSIS — I2510 Atherosclerotic heart disease of native coronary artery without angina pectoris: Secondary | ICD-10-CM

## 2021-04-19 DIAGNOSIS — E1169 Type 2 diabetes mellitus with other specified complication: Secondary | ICD-10-CM

## 2021-04-19 DIAGNOSIS — Z720 Tobacco use: Secondary | ICD-10-CM | POA: Diagnosis not present

## 2021-04-19 DIAGNOSIS — E1159 Type 2 diabetes mellitus with other circulatory complications: Secondary | ICD-10-CM

## 2021-04-19 DIAGNOSIS — I152 Hypertension secondary to endocrine disorders: Secondary | ICD-10-CM | POA: Diagnosis not present

## 2021-04-19 DIAGNOSIS — E785 Hyperlipidemia, unspecified: Secondary | ICD-10-CM

## 2021-04-19 DIAGNOSIS — Z1211 Encounter for screening for malignant neoplasm of colon: Secondary | ICD-10-CM

## 2021-04-19 LAB — POCT GLYCOSYLATED HEMOGLOBIN (HGB A1C): Hemoglobin A1C: 6.1 % — AB (ref 4.0–5.6)

## 2021-04-19 NOTE — Assessment & Plan Note (Signed)
Smoking a lot less, about 2 to 3 cigarettes/day. Cardiovascular and cancer risks associated with smoking discussed with patient. Smoking cessation advice given.

## 2021-04-19 NOTE — Patient Instructions (Signed)

## 2021-04-19 NOTE — Assessment & Plan Note (Signed)
Well-controlled hypertension.  Continue lisinopril 40 mg daily, amlodipine 5 mg daily and carvedilol 25 mg twice daily. Well-controlled diabetes with hemoglobin A1c of 6.1.  Continue Jardiance 10 mg daily, metformin 1000 mg twice a day, and Trulicity 1.5 mg weekly. Diet and nutrition discussed.

## 2021-04-19 NOTE — Assessment & Plan Note (Addendum)
Stable.  No anginal episodes. Continue Plavix and aspirin. Last cardiology visit with Dr. Mayford Knife on 10/07/2020 assessment and plan as follows: Continue dual antiplatelet therapy with aspirin and Brilinta for 1 year along with aggressive secondary prevention.  ASSESSMENT:     1. CAD in native artery   2. Pure hypercholesterolemia   3. Essential hypertension, benign     PLAN:     In order of problems listed above:   1.  ASCAD  - cath showed  80% distal RCA and 25% mid to proximal left circumflex.   -He is status post drug-eluting Synergy stent to the RCA 08/16/2017.   -ETT for atypical chest pain 2021 showed no ischemia -recently in ER with chest wall pain after heaving lifting and this has completely resolved -he has not had any further anginal symptoms -He will continue on ASA 81mg  daily, Plavix 75mg  daily, BB, Jardiance 10mg  daily and statin.    2. Hyperlipidemia  -LDL goal < 70 -LDL was 55 in Aug 2021 -Continue Atorvastatin 80mg  daily.     3.  HTN  -BP is controlled on exam today -continue amlodipine 5mg  daily, Lisinopril 40mg  daily and Carvedilol 25mg  BID  -SCr stable at 1.03 and K+ 3.7 in March 2022

## 2021-04-19 NOTE — Assessment & Plan Note (Signed)
Diet and nutrition discussed.  Continue atorvastatin 80 mg daily. 

## 2021-04-19 NOTE — Progress Notes (Signed)
Clifford Frederick 55 y.o.   Chief Complaint  Patient presents with   Diabetes    Bp and diabetic F / u    HISTORY OF PRESENT ILLNESS: This is a 55 y.o. male with history of diabetes and hypertension here for follow-up. Doing well.  Has no complaints or medical concerns today.  Diabetes Pertinent negatives for hypoglycemia include no dizziness or headaches. Pertinent negatives for diabetes include no chest pain.    Prior to Admission medications   Medication Sig Start Date End Date Taking? Authorizing Provider  amLODipine (NORVASC) 5 MG tablet Take 1 tablet (5 mg total) by mouth daily. 11/23/20   Sueanne Margarita, MD  aspirin 81 MG chewable tablet Chew 1 tablet (81 mg total) by mouth daily. 08/18/17   Daune Perch, NP  atorvastatin (LIPITOR) 80 MG tablet TAKE 1 TABLET BY MOUTH ONCE DAILY AT  6PM  IN  THE  EVENING 02/01/21   Horald Pollen, MD  blood glucose meter kit and supplies Dispense based on patient and insurance preference. Use up to four times daily as directed. (FOR ICD-10 E10.9, E11.9). 06/04/18   Posey Boyer, MD  carvedilol (COREG) 25 MG tablet TAKE 1 TABLET BY MOUTH TWICE DAILY WITH A MEAL 01/08/21   Sueanne Margarita, MD  clopidogrel (PLAVIX) 75 MG tablet Take 1 tablet by mouth once daily 01/01/21   Sueanne Margarita, MD  cyclobenzaprine (FLEXERIL) 5 MG tablet Take 1 tablet (5 mg total) by mouth 3 (three) times daily as needed for muscle spasms. 08/28/20   Maximiano Coss, NP  Dulaglutide (TRULICITY) 1.5 RC/1.6LA SOPN Inject 1.5 mg into the skin once a week. 07/15/20   Horald Pollen, MD  JARDIANCE 10 MG TABS tablet Take 1 tablet by mouth once daily 02/16/21   Horald Pollen, MD  lisinopril (ZESTRIL) 40 MG tablet Take 1 tablet by mouth once daily 11/22/20   Horald Pollen, MD  metFORMIN (GLUCOPHAGE) 1000 MG tablet TAKE 1 TABLET BY MOUTH TWICE DAILY WITH MEALS 03/28/21   Yoni Lobos, Ines Bloomer, MD  sildenafil (VIAGRA) 100 MG tablet Take 0.5-1 tablets (50-100 mg  total) by mouth daily as needed for erectile dysfunction. 01/13/20   Horald Pollen, MD    No Known Allergies  Patient Active Problem List   Diagnosis Date Noted   Hypertension associated with type 2 diabetes mellitus ([place]) 07/17/2019   CAD in native artery    Pure hypercholesterolemia    Tobacco abuse 08/15/2017   Dyslipidemia associated with type 2 diabetes mellitus ([place]) 08/15/2017   Normocytic anemia 08/15/2017   Essential hypertension, benign 08/08/2012    Past Medical History:  Diagnosis Date   Abnormal EKG    Acute coronary syndrome ([place]) 08/15/2017   CAD in native artery    A. [place] 08/16/17- DES to RCA   Chest pain 08/15/2017   Diabetes mellitus type 2 in obese ([place]) 08/15/2017   Essential hypertension, benign 08/08/2012   Fracture of lateral malleolus of left ankle 08/07/2012   High cholesterol    Hypertensive urgency 08/15/2017   Sleep apnea    Tobacco abuse 08/15/2017   Unstable angina [facility])     Past Surgical History:  Procedure Laterality Date   CORONARY STENT INTERVENTION N/A 08/16/2017   Procedure: CORONARY STENT INTERVENTION;  Surgeon: Jettie Booze, MD;  Location: [facility] [facility];  Service: Cardiovascular;  Laterality: N/A;   LEFT HEART CATH AND CORONARY ANGIOGRAPHY N/A 08/16/2017   Procedure: LEFT HEART CATH AND  CORONARY ANGIOGRAPHY;  Surgeon: Jettie Booze, MD;  Location: [facility];  Service: Cardiovascular;  Laterality: N/A;   ORIF ANKLE FRACTURE Left 08/07/2012   Procedure: OPEN REDUCTION INTERNAL FIXATION (ORIF) LEFT ANKLE FRACTURE;  Surgeon: Mcarthur Rossetti, MD;  Location: [place];  Service: Orthopedics;  Laterality: Left;    Social History   Socioeconomic History   Marital status: Married    Spouse name: Not on file   Number of children: Not on file   Years of education: Not on file   Highest education level: Not on file  Occupational History   Not on file  Tobacco Use   Smoking status: Some Days    Packs/day: 0.25     Years: 20.00    Pack years: 5.00    Types: Cigarettes    Start date: 08/16/2017   Smokeless tobacco: Never   Tobacco comments:    currently smokes 3 cigarettes a day  Vaping Use   Vaping Use: Never used  Substance and Sexual Activity   Alcohol use: Yes    Alcohol/week: 6.0 standard drinks    Types: 6 Cans of beer per week    Comment: occ   Drug use: No   Sexual activity: Not on file  Other Topics Concern   Not on file  Social History Narrative   Not on file   Social Determinants of Health   Financial Resource Strain: Not on file  Food Insecurity: Not on file  Transportation Needs: Not on file  Physical Activity: Not on file  Stress: Not on file  Social Connections: Not on file  Intimate Partner Violence: Not on file    Family History  Problem Relation Age of Onset   Diabetes Mother    Hypertension Mother    Diabetes Brother    Hypertension Brother      Review of Systems  Constitutional: Negative.  Negative for chills and fever.  Respiratory: Negative.  Negative for cough and shortness of breath.   Cardiovascular: Negative.  Negative for chest pain and palpitations.  Gastrointestinal: Negative.  Negative for abdominal pain, diarrhea, nausea and vomiting.  Genitourinary: Negative.  Negative for dysuria.  Skin: Negative.  Negative for rash.  Neurological: Negative.  Negative for dizziness and headaches.  All other systems reviewed and are negative.  Vitals:   04/19/21 0838  BP: 128/88  Pulse: 98  Temp: 98.4 F (36.9 C)  SpO2: 98%   Wt Readings from Last 3 Encounters:  04/19/21 221 lb (100.2 kg)  10/14/20 221 lb (100.2 kg)  09/08/20 225 lb (102.1 kg)    Physical Exam Vitals reviewed.  Constitutional:      Appearance: Normal appearance.  HENT:     Head: Normocephalic.     Mouth/Throat:     Mouth: Mucous membranes are moist.     Pharynx: Oropharynx is clear.  Eyes:     Extraocular Movements: Extraocular movements intact.     Conjunctiva/sclera:  Conjunctivae normal.     Pupils: Pupils are equal, round, and reactive to light.  Cardiovascular:     Rate and Rhythm: Normal rate and regular rhythm.     Pulses: Normal pulses.     Heart sounds: Normal heart sounds.  Pulmonary:     Effort: Pulmonary effort is normal.     Breath sounds: Normal breath sounds.  Abdominal:     Palpations: Abdomen is soft.     Tenderness: There is no abdominal tenderness.  Musculoskeletal:     Cervical back:  Normal range of motion and neck supple.     Right lower leg: No edema.     Left lower leg: No edema.  Skin:    General: Skin is warm and dry.     Capillary Refill: Capillary refill takes less than 2 seconds.  Neurological:     General: No focal deficit present.     Mental Status: He is alert and oriented to person, place, and time.  Psychiatric:        Mood and Affect: Mood normal.        Behavior: Behavior normal.    Results for orders placed or performed in visit on 04/19/21 (from the past 24 hour(s))  POCT glycosylated hemoglobin (Hb A1C)     Status: Abnormal   Collection Time: 04/19/21  8:50 AM  Result Value Ref Range   Hemoglobin A1C 6.1 (A) 4.0 - 5.6 %   HbA1c POC (<> result, manual entry)     HbA1c, POC (prediabetic range)     HbA1c, POC (controlled diabetic range)      ASSESSMENT & PLAN: Problem List Items Addressed This Visit       Cardiovascular and Mediastinum   CAD in native artery    Stable.  No anginal episodes. Continue Plavix and aspirin. Last cardiology visit with Dr. Radford Pax on 10/07/2020 assessment and plan as follows: Continue dual antiplatelet therapy with aspirin and Brilinta for 1 year along with aggressive secondary prevention.  ASSESSMENT:     1. CAD in native artery   2. Pure hypercholesterolemia   3. Essential hypertension, benign     PLAN:     In order of problems listed above:   1.  ASCAD  - cath showed  80% distal RCA and 25% mid to proximal left circumflex.   -He is status post drug-eluting  Synergy stent to the RCA 08/16/2017.   -ETT for atypical chest pain 2021 showed no ischemia -recently in ER with chest wall pain after heaving lifting and this has completely resolved -he has not had any further anginal symptoms -He will continue on ASA 31m daily, Plavix 721mdaily, BB, Jardiance 1018maily and statin.    2. Hyperlipidemia  -LDL goal < 70 -LDL was 55 in Aug 2021 -Continue Atorvastatin 4m52mily.     3.  HTN  -BP is controlled on exam today -continue amlodipine 5mg 10mly, Lisinopril 40mg 29my and Carvedilol 25mg [id number] stable at 1.03 and K+ 3.7 in March 2022         Hypertension associated with type 2 diabetes mellitus (HCC) - [place]    Well-controlled hypertension.  Continue lisinopril 40 mg daily, amlodipine 5 mg daily and carvedilol 25 mg twice daily. Well-controlled diabetes with hemoglobin A1c of 6.1.  Continue Jardiance 10 mg daily, metformin 1000 mg twice a day, and Trulicity 1.5 mg weekly. Diet and nutrition discussed.      Relevant Orders   POCT glycosylated hemoglobin (Hb A1C) (Completed)     Endocrine   Dyslipidemia associated with type 2 diabetes mellitus (HCC)   [place] and nutrition discussed.  Continue atorvastatin 80 mg daily.        Other   Tobacco abuse    Smoking a lot less, about 2 to 3 cigarettes/day. Cardiovascular and cancer risks associated with smoking discussed with patient. Smoking cessation advice given.      Other Visit Diagnoses     Colon cancer screening       Relevant Orders   Ambulatory referral  to Gastroenterology        Patient Instructions  Health Maintenance, Male Adopting a healthy lifestyle and getting preventive care are important in promoting health and wellness. Ask your health care provider about: The right schedule for you to have regular tests and exams. Things you can do on your own to prevent diseases and keep yourself healthy. What should I know about diet, weight, and exercise? Eat a healthy  diet  Eat a diet that includes plenty of vegetables, fruits, low-fat dairy products, and lean protein. Do not eat a lot of foods that are high in solid fats, added sugars, or sodium. Maintain a healthy weight Body mass index (BMI) is a measurement that can be used to identify possible weight problems. It estimates body fat based on height and weight. Your health care provider can help determine your BMI and help you achieve or maintain a healthy weight. Get regular exercise Get regular exercise. This is one of the most important things you can do for your health. Most adults should: Exercise for at least 150 minutes each week. The exercise should increase your heart rate and make you sweat (moderate-intensity exercise). Do strengthening exercises at least twice a week. This is in addition to the moderate-intensity exercise. Spend less time sitting. Even light physical activity can be beneficial. Watch cholesterol and blood lipids Have your blood tested for lipids and cholesterol at 55 years of age, then have this test every 5 years. You may need to have your cholesterol levels checked more often if: Your lipid or cholesterol levels are high. You are older than 55 years of age. You are at high risk for heart disease. What should I know about cancer screening? Many types of cancers can be detected early and may often be prevented. Depending on your health history and family history, you may need to have cancer screening at various ages. This may include screening for: Colorectal cancer. Prostate cancer. Skin cancer. Lung cancer. What should I know about heart disease, diabetes, and high blood pressure? Blood pressure and heart disease High blood pressure causes heart disease and increases the risk of stroke. This is more likely to develop in people who have high blood pressure readings or are overweight. Talk with your health care provider about your target blood pressure readings. Have your  blood pressure checked: Every 3-5 years if you are 2-23 years of age. Every year if you are 36 years old or older. If you are between the ages of 56 and 32 and are a current or former smoker, ask your health care provider if you should have a one-time screening for abdominal aortic aneurysm (AAA). Diabetes Have regular diabetes screenings. This checks your fasting blood sugar level. Have the screening done: Once every three years after age 18 if you are at a normal weight and have a low risk for diabetes. More often and at a younger age if you are overweight or have a high risk for diabetes. What should I know about preventing infection? Hepatitis B If you have a higher risk for hepatitis B, you should be screened for this virus. Talk with your health care provider to find out if you are at risk for hepatitis B infection. Hepatitis C Blood testing is recommended for: Everyone born from [birth year] through 1965. Anyone with known risk factors for hepatitis C. Sexually transmitted infections (STIs) You should be screened each year for STIs, including gonorrhea and chlamydia, if: You are sexually active and are younger than 55  years of age. You are older than 55 years of age and your health care provider tells you that you are at risk for this type of infection. Your sexual activity has changed since you were last screened, and you are at increased risk for chlamydia or gonorrhea. Ask your health care provider if you are at risk. Ask your health care provider about whether you are at high risk for HIV. Your health care provider may recommend a prescription medicine to help prevent HIV infection. If you choose to take medicine to prevent HIV, you should first get tested for HIV. You should then be tested every 3 months for as long as you are taking the medicine. Follow these instructions at home: Alcohol use Do not drink alcohol if your health care provider tells you not to drink. If you drink  alcohol: Limit how much you have to 0-2 drinks a day. Know how much alcohol is in your drink. In the [place] one drink equals one 12 oz bottle of beer (355 mL), one 5 oz glass of wine (148 mL), or one 1 oz glass of hard liquor (44 mL). Lifestyle Do not use any products that contain nicotine or tobacco. These products include cigarettes, chewing tobacco, and vaping devices, such as e-cigarettes. If you need help quitting, ask your health care provider. Do not use street drugs. Do not share needles. Ask your health care provider for help if you need support or information about quitting drugs. General instructions Schedule regular health, dental, and eye exams. Stay current with your vaccines. Tell your health care provider if: You often feel depressed. You have ever been abused or do not feel safe at home. Summary Adopting a healthy lifestyle and getting preventive care are important in promoting health and wellness. Follow your health care provider's instructions about healthy diet, exercising, and getting tested or screened for diseases. Follow your health care provider's instructions on monitoring your cholesterol and blood pressure. This information is not intended to replace advice given to you by your health care provider. Make sure you discuss any questions you have with your health care provider. Document Revised: 10/12/2020 Document Reviewed: 10/12/2020 Elsevier Patient Education  2022 [place], MD [facility]

## 2021-04-26 ENCOUNTER — Other Ambulatory Visit: Payer: Self-pay | Admitting: Cardiology

## 2021-05-26 ENCOUNTER — Other Ambulatory Visit: Payer: Self-pay | Admitting: Emergency Medicine

## 2021-05-26 DIAGNOSIS — I10 Essential (primary) hypertension: Secondary | ICD-10-CM

## 2021-07-05 ENCOUNTER — Other Ambulatory Visit: Payer: Self-pay | Admitting: Emergency Medicine

## 2021-07-05 DIAGNOSIS — E1159 Type 2 diabetes mellitus with other circulatory complications: Secondary | ICD-10-CM

## 2021-07-31 ENCOUNTER — Other Ambulatory Visit: Payer: Self-pay | Admitting: Cardiology

## 2021-09-02 ENCOUNTER — Other Ambulatory Visit: Payer: Self-pay | Admitting: Emergency Medicine

## 2021-09-02 DIAGNOSIS — I10 Essential (primary) hypertension: Secondary | ICD-10-CM

## 2021-09-20 ENCOUNTER — Other Ambulatory Visit: Payer: Self-pay | Admitting: Emergency Medicine

## 2021-09-20 DIAGNOSIS — I10 Essential (primary) hypertension: Secondary | ICD-10-CM

## 2021-09-28 ENCOUNTER — Other Ambulatory Visit: Payer: Self-pay | Admitting: Emergency Medicine

## 2021-09-28 DIAGNOSIS — E1159 Type 2 diabetes mellitus with other circulatory complications: Secondary | ICD-10-CM

## 2021-10-08 ENCOUNTER — Other Ambulatory Visit: Payer: Self-pay | Admitting: Emergency Medicine

## 2021-10-08 DIAGNOSIS — E1159 Type 2 diabetes mellitus with other circulatory complications: Secondary | ICD-10-CM

## 2021-10-08 DIAGNOSIS — I152 Hypertension secondary to endocrine disorders: Secondary | ICD-10-CM

## 2021-10-20 ENCOUNTER — Ambulatory Visit: Payer: No Typology Code available for payment source | Admitting: Emergency Medicine

## 2021-10-20 ENCOUNTER — Encounter: Payer: Self-pay | Admitting: Emergency Medicine

## 2021-10-20 VITALS — BP 138/84 | HR 97 | Temp 98.5°F | Ht 70.0 in | Wt 212.5 lb

## 2021-10-20 DIAGNOSIS — Z720 Tobacco use: Secondary | ICD-10-CM

## 2021-10-20 DIAGNOSIS — L84 Corns and callosities: Secondary | ICD-10-CM | POA: Diagnosis not present

## 2021-10-20 DIAGNOSIS — E1159 Type 2 diabetes mellitus with other circulatory complications: Secondary | ICD-10-CM

## 2021-10-20 DIAGNOSIS — Z1211 Encounter for screening for malignant neoplasm of colon: Secondary | ICD-10-CM

## 2021-10-20 DIAGNOSIS — I152 Hypertension secondary to endocrine disorders: Secondary | ICD-10-CM

## 2021-10-20 DIAGNOSIS — E785 Hyperlipidemia, unspecified: Secondary | ICD-10-CM | POA: Diagnosis not present

## 2021-10-20 DIAGNOSIS — I2510 Atherosclerotic heart disease of native coronary artery without angina pectoris: Secondary | ICD-10-CM

## 2021-10-20 DIAGNOSIS — E1169 Type 2 diabetes mellitus with other specified complication: Secondary | ICD-10-CM | POA: Diagnosis not present

## 2021-10-20 DIAGNOSIS — N529 Male erectile dysfunction, unspecified: Secondary | ICD-10-CM

## 2021-10-20 LAB — COMPREHENSIVE METABOLIC PANEL
ALT: 24 U/L (ref 0–53)
AST: 21 U/L (ref 0–37)
Albumin: 4.4 g/dL (ref 3.5–5.2)
Alkaline Phosphatase: 45 U/L (ref 39–117)
BUN: 13 mg/dL (ref 6–23)
CO2: 26 mEq/L (ref 19–32)
Calcium: 9.9 mg/dL (ref 8.4–10.5)
Chloride: 106 mEq/L (ref 96–112)
Creatinine, Ser: 1.12 mg/dL (ref 0.40–1.50)
GFR: 73.87 mL/min (ref >60.00)
Glucose, Bld: 117 mg/dL — ABNORMAL HIGH (ref 70–99)
Potassium: 3.8 mEq/L (ref 3.5–5.1)
Sodium: 142 mEq/L (ref 135–145)
Total Bilirubin: 0.6 mg/dL (ref 0.2–1.2)
Total Protein: 7.1 g/dL (ref 6.0–8.3)

## 2021-10-20 LAB — HEMOGLOBIN A1C: Hgb A1c MFr Bld: 6.4 % (ref 4.6–6.5)

## 2021-10-20 LAB — MICROALBUMIN / CREATININE URINE RATIO
Creatinine,U: 106.8 mg/dL
Microalb Creat Ratio: 2.9 mg/g (ref 0.0–30.0)
Microalb, Ur: 3.1 mg/dL — ABNORMAL HIGH (ref 0.0–1.9)

## 2021-10-20 LAB — LIPID PANEL
Cholesterol: 111 mg/dL (ref 0–200)
HDL: 40.2 mg/dL (ref >39.00)
LDL Cholesterol: 56 mg/dL (ref 0–99)
NonHDL: 71
Total CHOL/HDL Ratio: 3
Triglycerides: 74 mg/dL (ref 0.0–149.0)
VLDL: 14.8 mg/dL (ref 0.0–40.0)

## 2021-10-20 MED ORDER — SILDENAFIL CITRATE 100 MG PO TABS: 50.0000 mg | ORAL_TABLET | Freq: Every day | ORAL | 11 refills | Status: AC | PRN

## 2021-10-20 NOTE — Patient Instructions (Signed)
Health Maintenance, Male Adopting a healthy lifestyle and getting preventive care are important in promoting health and wellness. Ask your health care provider about: The right schedule for you to have regular tests and exams. Things you can do on your own to prevent diseases and keep yourself healthy. What should I know about diet, weight, and exercise? Eat a healthy diet  Eat a diet that includes plenty of vegetables, fruits, low-fat dairy products, and lean protein. Do not eat a lot of foods that are high in solid fats, added sugars, or sodium. Maintain a healthy weight Body mass index (BMI) is a measurement that can be used to identify possible weight problems. It estimates body fat based on height and weight. Your health care provider can help determine your BMI and help you achieve or maintain a healthy weight. Get regular exercise Get regular exercise. This is one of the most important things you can do for your health. Most adults should: Exercise for at least 150 minutes each week. The exercise should increase your heart rate and make you sweat (moderate-intensity exercise). Do strengthening exercises at least twice a week. This is in addition to the moderate-intensity exercise. Spend less time sitting. Even light physical activity can be beneficial. Watch cholesterol and blood lipids Have your blood tested for lipids and cholesterol at 56 years of age, then have this test every 5 years. You may need to have your cholesterol levels checked more often if: Your lipid or cholesterol levels are high. You are older than 56 years of age. You are at high risk for heart disease. What should I know about cancer screening? Many types of cancers can be detected early and may often be prevented. Depending on your health history and family history, you may need to have cancer screening at various ages. This may include screening for: Colorectal cancer. Prostate cancer. Skin cancer. Lung  cancer. What should I know about heart disease, diabetes, and high blood pressure? Blood pressure and heart disease High blood pressure causes heart disease and increases the risk of stroke. This is more likely to develop in people who have high blood pressure readings or are overweight. Talk with your health care provider about your target blood pressure readings. Have your blood pressure checked: Every 3-5 years if you are 18-39 years of age. Every year if you are 40 years old or older. If you are between the ages of 65 and 75 and are a current or former smoker, ask your health care provider if you should have a one-time screening for abdominal aortic aneurysm (AAA). Diabetes Have regular diabetes screenings. This checks your fasting blood sugar level. Have the screening done: Once every three years after age 45 if you are at a normal weight and have a low risk for diabetes. More often and at a younger age if you are overweight or have a high risk for diabetes. What should I know about preventing infection? Hepatitis B If you have a higher risk for hepatitis B, you should be screened for this virus. Talk with your health care provider to find out if you are at risk for hepatitis B infection. Hepatitis C Blood testing is recommended for: Everyone born from 1945 through 1965. Anyone with known risk factors for hepatitis C. Sexually transmitted infections (STIs) You should be screened each year for STIs, including gonorrhea and chlamydia, if: You are sexually active and are younger than 56 years of age. You are older than 56 years of age and your   health care provider tells you that you are at risk for this type of infection. Your sexual activity has changed since you were last screened, and you are at increased risk for chlamydia or gonorrhea. Ask your health care provider if you are at risk. Ask your health care provider about whether you are at high risk for HIV. Your health care provider  may recommend a prescription medicine to help prevent HIV infection. If you choose to take medicine to prevent HIV, you should first get tested for HIV. You should then be tested every 3 months for as long as you are taking the medicine. Follow these instructions at home: Alcohol use Do not drink alcohol if your health care provider tells you not to drink. If you drink alcohol: Limit how much you have to 0-2 drinks a day. Know how much alcohol is in your drink. In the [place] one drink equals one 12 oz bottle of beer (355 mL), one 5 oz glass of wine (148 mL), or one 1 oz glass of hard liquor (44 mL). Lifestyle Do not use any products that contain nicotine or tobacco. These products include cigarettes, chewing tobacco, and vaping devices, such as e-cigarettes. If you need help quitting, ask your health care provider. Do not use street drugs. Do not share needles. Ask your health care provider for help if you need support or information about quitting drugs. General instructions Schedule regular health, dental, and eye exams. Stay current with your vaccines. Tell your health care provider if: You often feel depressed. You have ever been abused or do not feel safe at home. Summary Adopting a healthy lifestyle and getting preventive care are important in promoting health and wellness. Follow your health care provider's instructions about healthy diet, exercising, and getting tested or screened for diseases. Follow your health care provider's instructions on monitoring your cholesterol and blood pressure. This information is not intended to replace advice given to you by your health care provider. Make sure you discuss any questions you have with your health care provider. Document Revised: 10/12/2020 Document Reviewed: 10/12/2020 Elsevier Patient Education  2023 [employer]  

## 2021-10-20 NOTE — Progress Notes (Signed)
Clifford Frederick 56 y.o.   Chief Complaint  Patient presents with   Follow-up   Referral    Referral to podiatry for feet, also referral to GI for colon cancer screening     HISTORY OF PRESENT ILLNESS: This is a 56 y.o. male with history of diabetes, hypertension, dyslipidemia, here for follow-up. Compliant with medications and diet. Doing well.  Has no complaints or medical concerns today. Requesting referral to GI for colonoscopy and podiatry for painful calluses. Lab Results  Component Value Date   HGBA1C 6.1 (A) 04/19/2021   BP Readings from Last 3 Encounters:  10/20/21 138/84  04/19/21 128/88  10/14/20 138/80   Wt Readings from Last 3 Encounters:  10/20/21 212 lb 8 oz (96.4 kg)  04/19/21 221 lb (100.2 kg)  10/14/20 221 lb (100.2 kg)     HPI   Prior to Admission medications   Medication Sig Start Date End Date Taking? Authorizing Provider  amLODipine (NORVASC) 5 MG tablet Take 1 tablet (5 mg total) by mouth daily. Please make yearly appt with Dr. Radford Pax for April 2023 for future refills. Thank you 1st attempt 08/02/21  Yes Turner, Eber Hong, MD  aspirin 81 MG chewable tablet Chew 1 tablet (81 mg total) by mouth daily. 08/18/17  Yes Daune Perch, NP  atorvastatin (LIPITOR) 80 MG tablet TAKE 1 TABLET BY MOUTH ONCE DAILY 6PM  IN  THE  EVENING 09/21/21  Yes Fortune Brannigan, Ines Bloomer, MD  blood glucose meter kit and supplies Dispense based on patient and insurance preference. Use up to four times daily as directed. (FOR ICD-10 E10.9, E11.9). 06/04/18  Yes Posey Boyer, MD  carvedilol (COREG) 25 MG tablet TAKE 1 TABLET BY MOUTH TWICE DAILY WITH A MEAL 04/26/21  Yes Sueanne Margarita, MD  clopidogrel (PLAVIX) 75 MG tablet Take 1 tablet by mouth once daily 01/01/21  Yes Turner, Eber Hong, MD  cyclobenzaprine (FLEXERIL) 5 MG tablet Take 1 tablet (5 mg total) by mouth 3 (three) times daily as needed for muscle spasms. 08/28/20  Yes Maximiano Coss, NP  Dulaglutide (TRULICITY) 1.5 GG/2.6RS  SOPN INJECT 1.5 MG INTO THE SKIN  SUBCUTANEOUSLY ONCE A WEEK 09/28/21  Yes Cinsere Mizrahi, Ines Bloomer, MD  JARDIANCE 10 MG TABS tablet Take 1 tablet by mouth once daily 09/21/21  Yes Franchelle Foskett, Ines Bloomer, MD  lisinopril (ZESTRIL) 40 MG tablet Take 1 tablet by mouth once daily 09/02/21  Yes Markis Langland, Ines Bloomer, MD  metFORMIN (GLUCOPHAGE) 1000 MG tablet TAKE 1 TABLET BY MOUTH TWICE DAILY WITH MEALS 10/09/21  Yes Da Authement, Ines Bloomer, MD  sildenafil (VIAGRA) 100 MG tablet Take 0.5-1 tablets (50-100 mg total) by mouth daily as needed for erectile dysfunction. 01/13/20  Yes Horald Pollen, MD    No Known Allergies  Patient Active Problem List   Diagnosis Date Noted   Hypertension associated with type 2 diabetes mellitus ([place]) 07/17/2019   CAD in native artery    Pure hypercholesterolemia    Tobacco abuse 08/15/2017   Dyslipidemia associated with type 2 diabetes mellitus ([place]) 08/15/2017   Normocytic anemia 08/15/2017   Essential hypertension, benign 08/08/2012    Past Medical History:  Diagnosis Date   Abnormal EKG    Acute coronary syndrome ([place]) 08/15/2017   CAD in native artery    [facility]. [place] 08/16/17- DES to RCA   Chest pain 08/15/2017   Diabetes mellitus type 2 in obese ([place]) 08/15/2017   Essential hypertension, benign 08/08/2012   Fracture of lateral malleolus of left  ankle 08/07/2012   High cholesterol    Hypertensive urgency 08/15/2017   Sleep apnea    Tobacco abuse 08/15/2017   Unstable angina [facility])     Past Surgical History:  Procedure Laterality Date   CORONARY STENT INTERVENTION N/A 08/16/2017   Procedure: CORONARY STENT INTERVENTION;  Surgeon: Jettie Booze, MD;  Location: [facility];  Service: Cardiovascular;  Laterality: N/A;   LEFT HEART CATH AND CORONARY ANGIOGRAPHY N/A 08/16/2017   Procedure: LEFT HEART CATH AND CORONARY ANGIOGRAPHY;  Surgeon: Jettie Booze, MD;  Location: [place] [facility];  Service: Cardiovascular;  Laterality: N/A;   ORIF ANKLE FRACTURE  Left 08/07/2012   Procedure: OPEN REDUCTION INTERNAL FIXATION (ORIF) LEFT ANKLE FRACTURE;  Surgeon: Mcarthur Rossetti, MD;  Location: [place];  Service: Orthopedics;  Laterality: Left;    Social History   Socioeconomic History   Marital status: Married    Spouse name: Not on file   Number of children: Not on file   Years of education: Not on file   Highest education level: Not on file  Occupational History   Not on file  Tobacco Use   Smoking status: Some Days    Packs/day: 0.25    Years: 20.00    Pack years: 5.00    Types: Cigarettes    Start date: 08/16/2017   Smokeless tobacco: Never   Tobacco comments:    currently smokes 3 cigarettes a day  Vaping Use   Vaping Use: Never used  Substance and Sexual Activity   Alcohol use: Yes    Alcohol/week: 6.0 standard drinks    Types: 6 Cans of beer per week    Comment: occ   Drug use: No   Sexual activity: Not on file  Other Topics Concern   Not on file  Social History Narrative   Not on file   Social Determinants of Health   Financial Resource Strain: Not on file  Food Insecurity: Not on file  Transportation Needs: Not on file  Physical Activity: Not on file  Stress: Not on file  Social Connections: Not on file  Intimate Partner Violence: Not on file    Family History  Problem Relation Age of Onset   Diabetes Mother    Hypertension Mother    Diabetes Brother    Hypertension Brother      Review of Systems  Constitutional: Negative.  Negative for chills and fever.  HENT: Negative.  Negative for congestion and sore throat.   Respiratory: Negative.  Negative for cough and shortness of breath.   Cardiovascular: Negative.  Negative for chest pain and palpitations.  Gastrointestinal:  Negative for abdominal pain, diarrhea, nausea and vomiting.  Genitourinary: Negative.   Skin: Negative.   All other systems reviewed and are negative.  Today's Vitals   10/20/21 0800  BP: 138/84  Pulse: 97  Temp: 98.5 F (36.9  C)  TempSrc: Oral  SpO2: 95%  Weight: 212 lb 8 oz (96.4 kg)  Height: $Remove'5\' 10"'BVPTOSC$  (1.778 m)   Body mass index is 30.49 kg/m.  Physical Exam Vitals reviewed.  Constitutional:      Appearance: Normal appearance.  HENT:     Head: Normocephalic.     Mouth/Throat:     Mouth: Mucous membranes are moist.     Pharynx: Oropharynx is clear.  Eyes:     Extraocular Movements: Extraocular movements intact.     Pupils: Pupils are equal, round, and reactive to light.  Cardiovascular:  Rate and Rhythm: Normal rate and regular rhythm.     Pulses: Normal pulses.     Heart sounds: Normal heart sounds.  Pulmonary:     Effort: Pulmonary effort is normal.     Breath sounds: Normal breath sounds.  Abdominal:     General: There is no distension.     Palpations: Abdomen is soft.     Tenderness: There is no abdominal tenderness.  Musculoskeletal:     Cervical back: No tenderness.     Right lower leg: No edema.     Left lower leg: No edema.     Comments: Feet: Bilateral calluses  Lymphadenopathy:     Cervical: No cervical adenopathy.  Skin:    General: Skin is warm and dry.     Capillary Refill: Capillary refill takes less than 2 seconds.  Neurological:     General: No focal deficit present.     Mental Status: He is alert and oriented to person, place, and time.  Psychiatric:        Mood and Affect: Mood normal.        Behavior: Behavior normal.     ASSESSMENT & PLAN: A total of 45 minutes was spent with the patient and counseling/coordination of care regarding preparing for this visit, review of most recent office visit notes, review of most recent cardiologist office visit note, review of multiple chronic medical problems under management, review of all medications, cardiovascular risks associated with hypertension and diabetes, education on nutrition, review of health maintenance items and need for colon cancer screening with colonoscopy, smoking cessation advice, prognosis, documentation,  and need for follow-up.  Problem List Items Addressed This Visit       Cardiovascular and Mediastinum   CAD in native artery    No recent anginal episodes.  Continue dual antiplatelet therapy with Plavix and aspirin. Continue beta-blocker with carvedilol 25 mg twice a day. Sees cardiologist on a regular basis.       Hypertension associated with type 2 diabetes mellitus ([place]) - Primary    Well-controlled hypertension. Continue amlodipine 5 mg and lisinopril 40 mg daily. Continue beta-blocker with carvedilol 25 mg daily. Cardiovascular risks associated with hypertension and diabetes discussed. Well-controlled diabetes.  Hemoglobin A1c done today.  Eating better and losing weight.  Exercising regularly. Continue metformin 1000 mg twice a day, Jardiance 10 mg daily, and Trulicity 1.5 mg weekly. Diet and nutrition discussed. Follow-up in 6 months.       Relevant Orders   Urine Microalbumin w/creat. ratio   Comprehensive metabolic panel   Hemoglobin A1c     Endocrine   Dyslipidemia associated with type 2 diabetes mellitus (HCC)    Stable.  Continue atorvastatin 80 mg daily.  Diet and nutrition discussed. Follow-up in 6 months.       Relevant Orders   Lipid panel     Other   Tobacco abuse    Smoking a lot less.  Cardiovascular and cancer risk associated with smoking discussed.  Smoking cessation advice given.       Other Visit Diagnoses     Colon cancer screening       Relevant Orders   Ambulatory referral to Gastroenterology   Callus of foot       Relevant Orders   Ambulatory referral to Podiatry      Patient Instructions  Health Maintenance, Male Adopting a healthy lifestyle and getting preventive care are important in promoting health and wellness. Ask your health care provider about: The right  schedule for you to have regular tests and exams. Things you can do on your own to prevent diseases and keep yourself healthy. What should I know about diet, weight,  and exercise? Eat a healthy diet  Eat a diet that includes plenty of vegetables, fruits, low-fat dairy products, and lean protein. Do not eat a lot of foods that are high in solid fats, added sugars, or sodium. Maintain a healthy weight Body mass index (BMI) is a measurement that can be used to identify possible weight problems. It estimates body fat based on height and weight. Your health care provider can help determine your BMI and help you achieve or maintain a healthy weight. Get regular exercise Get regular exercise. This is one of the most important things you can do for your health. Most adults should: Exercise for at least 150 minutes each week. The exercise should increase your heart rate and make you sweat (moderate-intensity exercise). Do strengthening exercises at least twice a week. This is in addition to the moderate-intensity exercise. Spend less time sitting. Even light physical activity can be beneficial. Watch cholesterol and blood lipids Have your blood tested for lipids and cholesterol at 56 years of age, then have this test every 5 years. You may need to have your cholesterol levels checked more often if: Your lipid or cholesterol levels are high. You are older than 56 years of age. You are at high risk for heart disease. What should I know about cancer screening? Many types of cancers can be detected early and may often be prevented. Depending on your health history and family history, you may need to have cancer screening at various ages. This may include screening for: Colorectal cancer. Prostate cancer. Skin cancer. Lung cancer. What should I know about heart disease, diabetes, and high blood pressure? Blood pressure and heart disease High blood pressure causes heart disease and increases the risk of stroke. This is more likely to develop in people who have high blood pressure readings or are overweight. Talk with your health care provider about your target blood  pressure readings. Have your blood pressure checked: Every 3-5 years if you are 20-69 years of age. Every year if you are 106 years old or older. If you are between the ages of 8 and 32 and are a current or former smoker, ask your health care provider if you should have a one-time screening for abdominal aortic aneurysm (AAA). Diabetes Have regular diabetes screenings. This checks your fasting blood sugar level. Have the screening done: Once every three years after age 19 if you are at a normal weight and have a low risk for diabetes. More often and at a younger age if you are overweight or have a high risk for diabetes. What should I know about preventing infection? Hepatitis B If you have a higher risk for hepatitis B, you should be screened for this virus. Talk with your health care provider to find out if you are at risk for hepatitis B infection. Hepatitis C Blood testing is recommended for: Everyone born from [birth year] through 1965. Anyone with known risk factors for hepatitis C. Sexually transmitted infections (STIs) You should be screened each year for STIs, including gonorrhea and chlamydia, if: You are sexually active and are younger than 56 years of age. You are older than 56 years of age and your health care provider tells you that you are at risk for this type of infection. Your sexual activity has changed since you were last screened,  and you are at increased risk for chlamydia or gonorrhea. Ask your health care provider if you are at risk. Ask your health care provider about whether you are at high risk for HIV. Your health care provider may recommend a prescription medicine to help prevent HIV infection. If you choose to take medicine to prevent HIV, you should first get tested for HIV. You should then be tested every 3 months for as long as you are taking the medicine. Follow these instructions at home: Alcohol use Do not drink alcohol if your health care provider tells you not to  drink. If you drink alcohol: Limit how much you have to 0-2 drinks a day. Know how much alcohol is in your drink. In the [place] one drink equals one 12 oz bottle of beer (355 mL), one 5 oz glass of wine (148 mL), or one 1 oz glass of hard liquor (44 mL). Lifestyle Do not use any products that contain nicotine or tobacco. These products include cigarettes, chewing tobacco, and vaping devices, such as e-cigarettes. If you need help quitting, ask your health care provider. Do not use street drugs. Do not share needles. Ask your health care provider for help if you need support or information about quitting drugs. General instructions Schedule regular health, dental, and eye exams. Stay current with your vaccines. Tell your health care provider if: You often feel depressed. You have ever been abused or do not feel safe at home. Summary Adopting a healthy lifestyle and getting preventive care are important in promoting health and wellness. Follow your health care provider's instructions about healthy diet, exercising, and getting tested or screened for diseases. Follow your health care provider's instructions on monitoring your cholesterol and blood pressure. This information is not intended to replace advice given to you by your health care provider. Make sure you discuss any questions you have with your health care provider. Document Revised: 10/12/2020 Document Reviewed: 10/12/2020 Elsevier Patient Education  [place], MD [facility]

## 2021-10-20 NOTE — Assessment & Plan Note (Signed)
No recent anginal episodes.  Continue dual antiplatelet therapy with Plavix and aspirin. ?Continue beta-blocker with carvedilol 25 mg twice a day. ?Sees cardiologist on a regular basis. ?

## 2021-10-20 NOTE — Assessment & Plan Note (Signed)
Well-controlled hypertension. ?Continue amlodipine 5 mg and lisinopril 40 mg daily. ?Continue beta-blocker with carvedilol 25 mg daily. ?Cardiovascular risks associated with hypertension and diabetes discussed. ?Well-controlled diabetes.  Hemoglobin A1c done today.  Eating better and losing weight.  Exercising regularly. ?Continue metformin 1000 mg twice a day, Jardiance 10 mg daily, and Trulicity 1.5 mg weekly. ?Diet and nutrition discussed. ?Follow-up in 6 months. ?

## 2021-10-20 NOTE — Assessment & Plan Note (Signed)
Smoking a lot less.  Cardiovascular and cancer risk associated with smoking discussed.  Smoking cessation advice given. ?

## 2021-10-20 NOTE — Addendum Note (Signed)
Addended by: Evie Lacks on: 10/20/2021 09:56 AM ? ? Modules accepted: Orders ? ?

## 2021-10-20 NOTE — Assessment & Plan Note (Signed)
Stable.  Continue atorvastatin 80 mg daily.  Diet and nutrition discussed. ?Follow-up in 6 months. ?

## 2021-10-27 ENCOUNTER — Ambulatory Visit (INDEPENDENT_AMBULATORY_CARE_PROVIDER_SITE_OTHER): Payer: No Typology Code available for payment source | Admitting: Podiatry

## 2021-10-27 ENCOUNTER — Encounter: Payer: Self-pay | Admitting: Podiatry

## 2021-10-27 DIAGNOSIS — E1149 Type 2 diabetes mellitus with other diabetic neurological complication: Secondary | ICD-10-CM

## 2021-10-27 DIAGNOSIS — E1140 Type 2 diabetes mellitus with diabetic neuropathy, unspecified: Secondary | ICD-10-CM

## 2021-10-27 DIAGNOSIS — M79675 Pain in left toe(s): Secondary | ICD-10-CM | POA: Diagnosis not present

## 2021-10-27 DIAGNOSIS — M79674 Pain in right toe(s): Secondary | ICD-10-CM

## 2021-10-27 DIAGNOSIS — B351 Tinea unguium: Secondary | ICD-10-CM

## 2021-10-27 NOTE — Progress Notes (Signed)
Subjective:   Patient ID: Clifford Frederick, male   DOB: 56 y.o.   MRN: [id number]   HPI Patient presents with severe thick yellow brittle nailbeds 1-5 both feet that are painful and lesion plantar right that is painful when pressed and makes walking shoe gear difficult   ROS      Objective:  Physical Exam  Neurovascular status intact with thick yellow brittle nailbeds 1-5 both feet that are painful and lesion formation bilateral that is painful     Assessment:  Chronic mycotic nail infection with also having moderate diabetes with history of neurological issues along with lesion     Plan:  Debrided painful nailbeds 1-5 both feet with at risk patient with long-term diabetes and carefully just trim the lesion courtesy for patient

## 2021-11-13 ENCOUNTER — Other Ambulatory Visit: Payer: Self-pay | Admitting: Cardiology

## 2021-11-23 ENCOUNTER — Other Ambulatory Visit: Payer: Self-pay | Admitting: Emergency Medicine

## 2021-11-23 DIAGNOSIS — I10 Essential (primary) hypertension: Secondary | ICD-10-CM

## 2021-12-12 ENCOUNTER — Other Ambulatory Visit: Payer: Self-pay | Admitting: Cardiology

## 2021-12-15 ENCOUNTER — Other Ambulatory Visit: Payer: Self-pay | Admitting: Emergency Medicine

## 2021-12-15 ENCOUNTER — Other Ambulatory Visit: Payer: Self-pay | Admitting: Cardiology

## 2021-12-15 DIAGNOSIS — I10 Essential (primary) hypertension: Secondary | ICD-10-CM

## 2021-12-28 ENCOUNTER — Telehealth: Payer: Self-pay | Admitting: Emergency Medicine

## 2021-12-28 NOTE — Telephone Encounter (Signed)
Encounter started in error - Patient needed to call his heart doctor for this refill.

## 2021-12-30 ENCOUNTER — Other Ambulatory Visit: Payer: Self-pay | Admitting: Cardiology

## 2021-12-31 ENCOUNTER — Other Ambulatory Visit: Payer: Self-pay | Admitting: Emergency Medicine

## 2021-12-31 DIAGNOSIS — E1159 Type 2 diabetes mellitus with other circulatory complications: Secondary | ICD-10-CM

## 2021-12-31 DIAGNOSIS — I152 Hypertension secondary to endocrine disorders: Secondary | ICD-10-CM

## 2022-01-04 ENCOUNTER — Other Ambulatory Visit: Payer: Self-pay | Admitting: Cardiology

## 2022-01-10 ENCOUNTER — Ambulatory Visit: Payer: No Typology Code available for payment source | Admitting: Physician Assistant

## 2022-01-10 ENCOUNTER — Encounter: Payer: Self-pay | Admitting: Physician Assistant

## 2022-01-10 VITALS — BP 140/90 | HR 96 | Ht 70.0 in | Wt 213.0 lb

## 2022-01-10 DIAGNOSIS — I2510 Atherosclerotic heart disease of native coronary artery without angina pectoris: Secondary | ICD-10-CM

## 2022-01-10 DIAGNOSIS — I10 Essential (primary) hypertension: Secondary | ICD-10-CM | POA: Diagnosis not present

## 2022-01-10 DIAGNOSIS — E7800 Pure hypercholesterolemia, unspecified: Secondary | ICD-10-CM

## 2022-01-10 MED ORDER — CLOPIDOGREL BISULFATE 75 MG PO TABS: 75.0000 mg | ORAL_TABLET | Freq: Every day | ORAL | 3 refills | Status: DC

## 2022-01-10 MED ORDER — AMLODIPINE BESYLATE 10 MG PO TABS: 10.0000 mg | ORAL_TABLET | Freq: Every day | ORAL | 3 refills | Status: DC

## 2022-01-10 NOTE — Patient Instructions (Addendum)
Medication Instructions:  Your physician has recommended you make the following change in your medication:   INCREASE the Amlodipine to 10 mg taking 1 daily    *If you need a refill on your cardiac medications before your next appointment, please call your pharmacy*   Lab Work: None ordered  If you have labs (blood work) drawn today and your tests are completely normal, you will receive your results only by: MyChart Message (if you have MyChart) OR A paper copy in the mail If you have any lab test that is abnormal or we need to change your treatment, we will call you to review the results.   Testing/Procedures: None ordered   Follow-Up: At [facility], you and your health needs are our priority.  As part of our continuing mission to provide you with exceptional heart care, we have created designated Provider Care Teams.  These Care Teams include your primary Cardiologist (physician) and Advanced Practice Providers (APPs -  Physician Assistants and Nurse Practitioners) who all work together to provide you with the care you need, when you need it.  We recommend signing up for the patient portal called "MyChart".  Sign up information is provided on this After Visit Summary.  MyChart is used to connect with patients for Virtual Visits (Telemedicine).  Patients are able to view lab/test results, encounter notes, upcoming appointments, etc.  Non-urgent messages can be sent to your provider as well.   To learn more about what you can do with MyChart, go to [email or web address].    Your next appointment:   12 month(s)  The format for your next appointment:   In Person  Provider:   Armanda Magic, MD  or Chelsea Aus, PA-C         Other Instructions   Important Information About Sugar

## 2022-01-10 NOTE — Progress Notes (Signed)
Cardiology Office Note:    Date:  01/10/2022   ID:  Clifford Frederick, DOB 1965-08-27, MRN [id number]  PCP:  Georgina Quint, MD  [facility] Cardiologist:  Armanda Magic, MD  [facility] Electrophysiologist:  None   Chief Complaint: yearly follow up   History of Present Illness:    Clifford Frederick is a 56 y.o. male with a hx of type 2 DM, hyperlipidemia, HTN, tobacco use and  CAD s/p DES to RCA in 2019 seen for follow up.   He presented 08/2017 with complaints of sudden onset of SOB while smoking a cigarette at work. Trop was negative. Cath showed  80% distal RCA and 25% mid to proximal left circumflex and underwent drug-eluting Synergy stent to the RCA.  He had recurrent CP after smoking a cigarette and nuclear stress test was normal.    Last seen by Dr. Mayford Knife 09/08/20.  Here today for follow up.  He does not check his blood pressure at home.  He denies chest pain, shortness of breath, orthopnea, PND, syncope, lower extremity edema or melena.  Reports compliance with medications.  He is ran out of his Plavix last week.  He walks 90 minutes Saturday and Sunday without any limitation.  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  He continues to smoke.    Past Medical History:  Diagnosis Date   Abnormal EKG    Acute coronary syndrome (HCC) 08/15/2017   CAD in native artery    A. LHC 08/16/17- DES to RCA   Chest pain 08/15/2017   Diabetes mellitus type 2 in obese (HCC) 08/15/2017   Essential hypertension, benign 08/08/2012   Fracture of lateral malleolus of left ankle 08/07/2012   High cholesterol    Hypertensive urgency 08/15/2017   Sleep apnea    Tobacco abuse 08/15/2017   Unstable angina [facility])     Past Surgical History:  Procedure Laterality Date   CORONARY STENT INTERVENTION N/A 08/16/2017   Procedure: CORONARY STENT INTERVENTION;  Surgeon: Corky Crafts, MD;  Location: [facility] INVASIVE CV LAB;  Service: Cardiovascular;  Laterality: N/A;   LEFT HEART CATH AND CORONARY  ANGIOGRAPHY N/A 08/16/2017   Procedure: LEFT HEART CATH AND CORONARY ANGIOGRAPHY;  Surgeon: Corky Crafts, MD;  Location: [facility] INVASIVE CV LAB;  Service: Cardiovascular;  Laterality: N/A;   ORIF ANKLE FRACTURE Left 08/07/2012   Procedure: OPEN REDUCTION INTERNAL FIXATION (ORIF) LEFT ANKLE FRACTURE;  Surgeon: Kathryne Hitch, MD;  Location: [facility] OR;  Service: Orthopedics;  Laterality: Left;    Current Medications: Current Meds  Medication Sig   amLODipine (NORVASC) 10 MG tablet Take 1 tablet (10 mg total) by mouth daily.   aspirin 81 MG chewable tablet Chew 1 tablet (81 mg total) by mouth daily.   atorvastatin (LIPITOR) 80 MG tablet TAKE 1 TABLET BY MOUTH ONCE DAILY AT  6PM   carvedilol (COREG) 25 MG tablet TAKE 1 TABLET BY MOUTH TWICE DAILY WITH A MEAL   cyclobenzaprine (FLEXERIL) 5 MG tablet Take 1 tablet (5 mg total) by mouth 3 (three) times daily as needed for muscle spasms.   Dulaglutide (TRULICITY) 1.5 MG/0.5ML SOPN INJECT 1.5 MG INTO THE SKIN  SUBCUTANEOUSLY ONCE A WEEK   JARDIANCE 10 MG TABS tablet Take 1 tablet by mouth once daily   lisinopril (ZESTRIL) 40 MG tablet Take 1 tablet by mouth once daily   metFORMIN (GLUCOPHAGE) 1000 MG tablet TAKE 1 TABLET BY MOUTH TWICE DAILY WITH MEALS   sildenafil (VIAGRA) 100  MG tablet Take 0.5-1 tablets (50-100 mg total) by mouth daily as needed for erectile dysfunction.   [DISCONTINUED] amLODipine (NORVASC) 5 MG tablet Take 1 tablet (5 mg total) by mouth daily.   [DISCONTINUED] clopidogrel (PLAVIX) 75 MG tablet Take 1 tablet (75 mg total) by mouth daily. CALL AND SCHEDULE FOLLOW UP OFFICE VISIT TO GET FURTHER REFILLS. [phone]. 1ST ATTEMPT.     Allergies:   Patient has no known allergies.   Social History   Socioeconomic History   Marital status: Married    Spouse name: Not on file   Number of children: Not on file   Years of education: Not on file   Highest education level: Not on file  Occupational History   Not on file   Tobacco Use   Smoking status: Some Days    Packs/day: 0.25    Years: 20.00    Total pack years: 5.00    Types: Cigarettes    Start date: 08/16/2017   Smokeless tobacco: Never   Tobacco comments:    currently smokes 3 cigarettes a day  Vaping Use   Vaping Use: Never used  Substance and Sexual Activity   Alcohol use: Yes    Alcohol/week: 6.0 standard drinks of alcohol    Types: 6 Cans of beer per week    Comment: occ   Drug use: No   Sexual activity: Not on file  Other Topics Concern   Not on file  Social History Narrative   Not on file   Social Determinants of Health   Financial Resource Strain: Not on file  Food Insecurity: Not on file  Transportation Needs: Not on file  Physical Activity: Not on file  Stress: Not on file  Social Connections: Not on file     Family History: The patient's family history includes Diabetes in his brother and mother; Hypertension in his brother and mother.    ROS:   Please see the history of present illness.    All other systems reviewed and are negative.   EKGs/Labs/Other Studies Reviewed:    The following studies were reviewed today:  Nuclear stress test 2019 Nuclear stress EF: 50%. Blood pressure demonstrated a hypertensive response to exercise. Downsloping ST segment depression of 1 mm was noted during stress in the II, III and aVF leads, beginning at 9 minutes of stress. However, there is T wave inversion in these leads at baseline which limits the specificity of this finding. The study is normal. This is a low risk study. The left ventricular ejection fraction is mildly decreased (45-54%).   2D echo 08/2017 Study Conclusions   - Left ventricle: The cavity size was normal. There was moderate    concentric hypertrophy. Systolic function was normal. The    estimated ejection fraction was in the range of 60% to 65%. There    is hypokinesis of the apicalinferolateral myocardium. Features    are consistent with a pseudonormal  left ventricular filling    pattern, with concomitant abnormal relaxation and increased    filling pressure (grade 2 diastolic dysfunction). Doppler    parameters are consistent with high ventricular filling pressure.  - Aortic valve: Trileaflet; mildly thickened, mildly calcified    leaflets.  - Mitral valve: Calcified annulus. There was trivial regurgitation.  - Left atrium: The atrium was mildly dilated.  - Pulmonary arteries: Systolic pressure could not be accurately    estimated.    Cardiac Cath 08/2017 Conclusion     Dist RCA lesion is 80% stenosed.  Prox Cx to Mid Cx lesion is 25% stenosed. There is mild to moderate left ventricular systolic dysfunction. There is no aortic valve stenosis. A drug-eluting stent was successfully placed using a STENT SYNERGY DES 3X16. Post intervention, there is a 0% residual stenosis.   Continue dual antiplatelet therapy with aspirin and Brilinta for 1 year along with aggressive secondary prevention.        Diagnostic Dominance: Right  Intervention          EKG:  EKG is  ordered today.  The ekg ordered today demonstrates SR  Recent Labs: 10/20/2021: ALT 24; BUN 13; Creatinine, Ser 1.12; Potassium 3.8; Sodium 142  Recent Lipid Panel    Component Value Date/Time   CHOL 111 10/20/2021 0830   CHOL 116 01/13/2020 1032   TRIG 74.0 10/20/2021 0830   HDL 40.20 10/20/2021 0830   HDL 45 01/13/2020 1032   CHOLHDL 3 10/20/2021 0830   VLDL 14.8 10/20/2021 0830   LDLCALC 56 10/20/2021 0830   LDLCALC 55 01/13/2020 1032    Physical Exam:    VS:  BP (!) 140/90   Pulse 96   Ht 5\' 10"  (1.778 m)   Wt 213 lb (96.6 kg)   BMI 30.56 kg/m     Wt Readings from Last 3 Encounters:  01/10/22 213 lb (96.6 kg)  10/20/21 212 lb 8 oz (96.4 kg)  04/19/21 221 lb (100.2 kg)     GEN:  Well nourished, well developed in no acute distress HEENT: Normal NECK: No JVD; No carotid bruits LYMPHATICS: No lymphadenopathy CARDIAC: RRR, no murmurs, rubs,  gallops RESPIRATORY:  Clear to auscultation without rales, wheezing or rhonchi  ABDOMEN: Soft, non-tender, non-distended MUSCULOSKELETAL:  No edema; No deformity  SKIN: Warm and dry NEUROLOGIC:  Alert and oriented x 3 PSYCHIATRIC:  Normal affect   ASSESSMENT AND PLAN:    CAD No angina.  Continue aspirin, Plavix, statin and beta-blocker.  2. HTN -Blood pressure minimally elevated.  Increase amlodipine to 10 mg daily.  Continue carvedilol 25 mg twice daily and lisinopril 40 mg daily.  3. HLD -Managed by PCP -10/20/2021: Cholesterol 111; HDL 40.20; LDL Cholesterol 56; Triglycerides 74.0; VLDL 14.8  -Continue Lipitor 80 mg daily  Medication Adjustments/Labs and Tests Ordered: Current medicines are reviewed at length with the patient today.  Concerns regarding medicines are outlined above.  Orders Placed This Encounter  Procedures   EKG 12-Lead   Meds ordered this encounter  Medications   clopidogrel (PLAVIX) 75 MG tablet    Sig: Take 1 tablet (75 mg total) by mouth daily. .    Dispense:  90 tablet    Refill:  3   amLODipine (NORVASC) 10 MG tablet    Sig: Take 1 tablet (10 mg total) by mouth daily.    Dispense:  90 tablet    Refill:  3    Patient Instructions  Medication Instructions:  Your physician has recommended you make the following change in your medication:   INCREASE the Amlodipine to 10 mg taking 1 daily    *If you need a refill on your cardiac medications before your next appointment, please call your pharmacy*   Lab Work: None ordered  If you have labs (blood work) drawn today and your tests are completely normal, you will receive your results only by: MyChart Message (if you have MyChart) OR A paper copy in the mail If you have any lab test that is abnormal or we need to change your treatment, we will call  you to review the results.   Testing/Procedures: None ordered   Follow-Up: At [facility], you and your health needs are our priority.  As  part of our continuing mission to provide you with exceptional heart care, we have created designated Provider Care Teams.  These Care Teams include your primary Cardiologist (physician) and Advanced Practice Providers (APPs -  Physician Assistants and Nurse Practitioners) who all work together to provide you with the care you need, when you need it.  We recommend signing up for the patient portal called "MyChart".  Sign up information is provided on this After Visit Summary.  MyChart is used to connect with patients for Virtual Visits (Telemedicine).  Patients are able to view lab/test results, encounter notes, upcoming appointments, etc.  Non-urgent messages can be sent to your provider as well.   To learn more about what you can do with MyChart, go to [email or web address].    Your next appointment:   12 month(s)  The format for your next appointment:   In Person  Provider:   Armanda Magic, MD  or Chelsea Aus, PA-C         Other Instructions   Important Information About Sugar         Lorelei Pont, [place]  01/10/2022 11:48 AM    [facility]

## 2022-04-25 ENCOUNTER — Ambulatory Visit: Payer: No Typology Code available for payment source | Admitting: Emergency Medicine

## 2022-04-25 ENCOUNTER — Encounter: Payer: Self-pay | Admitting: Emergency Medicine

## 2022-04-25 VITALS — BP 132/80 | HR 77 | Temp 98.6°F | Ht 70.0 in | Wt 209.0 lb

## 2022-04-25 DIAGNOSIS — E1169 Type 2 diabetes mellitus with other specified complication: Secondary | ICD-10-CM

## 2022-04-25 DIAGNOSIS — I2510 Atherosclerotic heart disease of native coronary artery without angina pectoris: Secondary | ICD-10-CM

## 2022-04-25 DIAGNOSIS — E1159 Type 2 diabetes mellitus with other circulatory complications: Secondary | ICD-10-CM | POA: Diagnosis not present

## 2022-04-25 DIAGNOSIS — I152 Hypertension secondary to endocrine disorders: Secondary | ICD-10-CM | POA: Diagnosis not present

## 2022-04-25 DIAGNOSIS — E785 Hyperlipidemia, unspecified: Secondary | ICD-10-CM

## 2022-04-25 LAB — POCT GLYCOSYLATED HEMOGLOBIN (HGB A1C): Hemoglobin A1C: 5.9 % — AB (ref 4.0–5.6)

## 2022-04-25 MED ORDER — TRULICITY 1.5 MG/0.5ML ~~LOC~~ SOAJ: SUBCUTANEOUS | 3 refills | Status: DC

## 2022-04-25 NOTE — Assessment & Plan Note (Signed)
Stable.  Diet and nutrition discussed. Continue atorvastatin 80 mg daily. 

## 2022-04-25 NOTE — Assessment & Plan Note (Signed)
Stable.  No recent anginal episodes. Continue carvedilol 25 mg twice a day Continue daily baby aspirin and Plavix 75 mg daily. Sees cardiologist on a regular basis.

## 2022-04-25 NOTE — Patient Instructions (Signed)

## 2022-04-25 NOTE — Assessment & Plan Note (Signed)
Well-controlled hypertension. Continue lisinopril 40 mg, amlodipine 10 mg and carvedilol 25 mg twice a day. BP Readings from Last 3 Encounters:  04/25/22 132/80  01/10/22 (!) 140/90  10/20/21 138/84  Well-controlled diabetes with hemoglobin A1c at 5.9. Stays physically active and is eating better. Continue Trulicity 1.5 mg weekly, Jardiance 10 mg daily, metformin 1000 mg twice a day. Cardiovascular risks associated with diabetes and hypertension discussed. Diet and nutrition discussed. Follow-up in 6 months.

## 2022-04-25 NOTE — Progress Notes (Signed)
Clifford Frederick 56 y.o.   Chief complaint: Here for follow-up of hypertension and diabetes  HISTORY OF PRESENT ILLNESS: This is a 56 y.o. male here for 62-monthfollow-up of diabetes and hypertension. Overall doing well.  Has no complaints or medical concerns today. Lab Results  Component Value Date   HGBA1C 5.9 (A) 04/25/2022   BP Readings from Last 3 Encounters:  04/25/22 132/80  01/10/22 (!) 140/90  10/20/21 138/84   Wt Readings from Last 3 Encounters:  04/25/22 209 lb (94.8 kg)  01/10/22 213 lb (96.6 kg)  10/20/21 212 lb 8 oz (96.4 kg)     HPI   Prior to Admission medications   Medication Sig Start Date End Date Taking? Authorizing Provider  amLODipine (NORVASC) 10 MG tablet Take 1 tablet (10 mg total) by mouth daily. 01/10/22  Yes Bhagat, Bhavinkumar, PA  aspirin 81 MG chewable tablet Chew 1 tablet (81 mg total) by mouth daily. 08/18/17  Yes HDaune Perch NP  atorvastatin (LIPITOR) 80 MG tablet TAKE 1 TABLET BY MOUTH ONCE DAILY AT  6PM 12/15/21  Yes Shauna Bodkins, MInes Bloomer MD  blood glucose meter kit and supplies Dispense based on patient and insurance preference. Use up to four times daily as directed. (FOR ICD-10 E10.9, E11.9). 06/04/18  Yes HPosey Boyer MD  carvedilol (COREG) 25 MG tablet TAKE 1 TABLET BY MOUTH TWICE DAILY WITH A MEAL 04/26/21  Yes Turner, TEber Hong MD  clopidogrel (PLAVIX) 75 MG tablet Take 1 tablet (75 mg total) by mouth daily. . 01/10/22  Yes Bhagat, Bhavinkumar, PA  cyclobenzaprine (FLEXERIL) 5 MG tablet Take 1 tablet (5 mg total) by mouth 3 (three) times daily as needed for muscle spasms. 08/28/20  Yes MMaximiano Coss NP  Dulaglutide (TRULICITY) 1.5 MRP/5.9YVSOPN INJECT 1.5 MG INTO THE SKIN  SUBCUTANEOUSLY ONCE A WEEK 09/28/21  Yes Franca Stakes, MInes Bloomer MD  JARDIANCE 10 MG TABS tablet Take 1 tablet by mouth once daily 12/15/21  Yes Gurtha Picker, MInes Bloomer MD  lisinopril (ZESTRIL) 40 MG tablet Take 1 tablet by mouth once daily 11/23/21  Yes Murdock Jellison,  MInes Bloomer MD  metFORMIN (GLUCOPHAGE) 1000 MG tablet TAKE 1 TABLET BY MOUTH TWICE DAILY WITH MEALS 12/31/21  Yes Michaila Kenney, MInes Bloomer MD  sildenafil (VIAGRA) 100 MG tablet Take 0.5-1 tablets (50-100 mg total) by mouth daily as needed for erectile dysfunction. 10/20/21  Yes SHorald Pollen MD    Not on File  Patient Active Problem List   Diagnosis Date Noted   Hypertension associated with type 2 diabetes mellitus ([facility] 07/17/2019   CAD in native artery    Pure hypercholesterolemia    Tobacco abuse 08/15/2017   Dyslipidemia associated with type 2 diabetes mellitus ([facility] 08/15/2017   Normocytic anemia 08/15/2017   Essential hypertension, benign 08/08/2012    Past Medical History:  Diagnosis Date   Abnormal EKG    Acute coronary syndrome ([facility] 08/15/2017   CAD in native artery    [place] DES to RCA   Chest pain 08/15/2017   Diabetes mellitus type 2 in obese ([facility] 08/15/2017   Essential hypertension, benign 08/08/2012   Fracture of lateral malleolus of left ankle 08/07/2012   High cholesterol    Hypertensive urgency 08/15/2017   Sleep apnea    Tobacco abuse 08/15/2017   Unstable angina ([facility]     Past Surgical History:  Procedure Laterality Date   CORONARY STENT INTERVENTION N/A 08/16/2017   Procedure: CORONARY STENT INTERVENTION;  Surgeon: VJettie Booze MD;  Location: [place] [facility];  Service: Cardiovascular;  Laterality: N/A;   LEFT HEART CATH AND CORONARY ANGIOGRAPHY N/A 08/16/2017   Procedure: LEFT HEART CATH AND CORONARY ANGIOGRAPHY;  Surgeon: Jettie Booze, MD;  Location: [place] [facility];  Service: Cardiovascular;  Laterality: N/A;   ORIF ANKLE FRACTURE Left 08/07/2012   Procedure: OPEN REDUCTION INTERNAL FIXATION (ORIF) LEFT ANKLE FRACTURE;  Surgeon: Mcarthur Rossetti, MD;  Location: [facility];  Service: Orthopedics;  Laterality: Left;    Social History   Socioeconomic History   Marital status: Married    Spouse name: Not on file   Number  of children: Not on file   Years of education: Not on file   Highest education level: Not on file  Occupational History   Not on file  Tobacco Use   Smoking status: Some Days    Packs/day: 0.25    Years: 20.00    Total pack years: 5.00    Types: Cigarettes    Start date: 08/16/2017   Smokeless tobacco: Never   Tobacco comments:    currently smokes 3 cigarettes a day  Vaping Use   Vaping Use: Never used  Substance and Sexual Activity   Alcohol use: Yes    Alcohol/week: 6.0 standard drinks of alcohol    Types: 6 Cans of beer per week    Comment: occ   Drug use: No   Sexual activity: Not on file  Other Topics Concern   Not on file  Social History Narrative   Not on file   Social Determinants of Health   Financial Resource Strain: Not on file  Food Insecurity: Not on file  Transportation Needs: Not on file  Physical Activity: Not on file  Stress: Not on file  Social Connections: Not on file  Intimate Partner Violence: Not on file    Family History  Problem Relation Age of Onset   Diabetes Mother    Hypertension Mother    Diabetes Brother    Hypertension Brother      Review of Systems  Constitutional: Negative.  Negative for chills and fever.  HENT: Negative.  Negative for congestion and sore throat.   Respiratory: Negative.  Negative for cough and shortness of breath.   Cardiovascular: Negative.  Negative for chest pain and palpitations.  Gastrointestinal:  Negative for abdominal pain, diarrhea, nausea and vomiting.  Genitourinary: Negative.  Negative for dysuria and hematuria.  Skin: Negative.  Negative for rash.  Neurological: Negative.  Negative for dizziness and headaches.  All other systems reviewed and are negative.   Today's Vitals   04/25/22 0813  BP: 132/80  Pulse: 77  Temp: 98.6 F (37 C)  TempSrc: Oral  SpO2: 99%  Weight: 209 lb (94.8 kg)  Height: 5' 10" (1.778 m)   Body mass index is 29.99 kg/m.  Physical Exam Vitals reviewed.   Constitutional:      Appearance: Normal appearance.  HENT:     Head: Normocephalic.  Eyes:     Extraocular Movements: Extraocular movements intact.     Pupils: Pupils are equal, round, and reactive to light.  Cardiovascular:     Rate and Rhythm: Normal rate and regular rhythm.     Pulses: Normal pulses.     Heart sounds: Normal heart sounds.  Pulmonary:     Effort: Pulmonary effort is normal.     Breath sounds: Normal breath sounds.  Musculoskeletal:        General: Normal range of motion.  Skin:  General: Skin is warm and dry.  Neurological:     General: No focal deficit present.     Mental Status: He is alert and oriented to person, place, and time.  Psychiatric:        Mood and Affect: Mood normal.        Behavior: Behavior normal.    Results for orders placed or performed in visit on 04/25/22 (from the past 24 hour(s))  POCT glycosylated hemoglobin (Hb A1C)     Status: Abnormal   Collection Time: 04/25/22  8:34 AM  Result Value Ref Range   Hemoglobin A1C 5.9 (A) 4.0 - 5.6 %   HbA1c POC (<> result, manual entry)     HbA1c, POC (prediabetic range)     HbA1c, POC (controlled diabetic range)       ASSESSMENT & PLAN: A total of 45 minutes was spent with the patient and counseling/coordination of care regarding preparing for this visit, review of most recent office visit notes, review of most recent blood work results including interpretation of today's hemoglobin A1c, review of all medications, cardiovascular risks associated with diabetes and hypertension, education on nutrition, prognosis, documentation, and need for follow-up.  Problem List Items Addressed This Visit       Cardiovascular and Mediastinum   CAD in native artery    Stable.  No recent anginal episodes. Continue carvedilol 25 mg twice a day Continue daily baby aspirin and Plavix 75 mg daily. Sees cardiologist on a regular basis.      Hypertension associated with type 2 diabetes mellitus ([place]) -  Primary    Well-controlled hypertension. Continue lisinopril 40 mg, amlodipine 10 mg and carvedilol 25 mg twice a day. BP Readings from Last 3 Encounters:  04/25/22 132/80  01/10/22 (!) 140/90  10/20/21 138/84  Well-controlled diabetes with hemoglobin A1c at 5.9. Stays physically active and is eating better. Continue Trulicity 1.5 mg weekly, Jardiance 10 mg daily, metformin 1000 mg twice a day. Cardiovascular risks associated with diabetes and hypertension discussed. Diet and nutrition discussed. Follow-up in 6 months.       Relevant Medications   Dulaglutide (TRULICITY) 1.5 JK/8.2SU SOPN   Other Relevant Orders   POCT glycosylated hemoglobin (Hb A1C) (Completed)     Endocrine   Dyslipidemia associated with type 2 diabetes mellitus (HCC)    Stable.  Diet and nutrition discussed. Continue atorvastatin 80 mg daily.      Relevant Medications   Dulaglutide (TRULICITY) 1.5 OR/5.6FB SOPN   Patient Instructions  Diabetes Mellitus and Nutrition, Adult When you have diabetes, or diabetes mellitus, it is very important to have healthy eating habits because your blood sugar (glucose) levels are greatly affected by what you eat and drink. Eating healthy foods in the right amounts, at about the same times every day, can help you: Manage your blood glucose. Lower your risk of heart disease. Improve your blood pressure. Reach or maintain a healthy weight. What can affect my meal plan? Every person with diabetes is different, and each person has different needs for a meal plan. Your health care provider may recommend that you work with a dietitian to make a meal plan that is best for you. Your meal plan may vary depending on factors such as: The calories you need. The medicines you take. Your weight. Your blood glucose, blood pressure, and cholesterol levels. Your activity level. Other health conditions you have, such as heart or kidney disease. How do carbohydrates affect  me? Carbohydrates, also called carbs, affect your  blood glucose level more than any other type of food. Eating carbs raises the amount of glucose in your blood. It is important to know how many carbs you can safely have in each meal. This is different for every person. Your dietitian can help you calculate how many carbs you should have at each meal and for each snack. How does alcohol affect me? Alcohol can cause a decrease in blood glucose (hypoglycemia), especially if you use insulin or take certain diabetes medicines by mouth. Hypoglycemia can be a life-threatening condition. Symptoms of hypoglycemia, such as sleepiness, dizziness, and confusion, are similar to symptoms of having too much alcohol. Do not drink alcohol if: Your health care provider tells you not to drink. You are pregnant, may be pregnant, or are planning to become pregnant. If you drink alcohol: Limit how much you have to: 0-1 drink a day for women. 0-2 drinks a day for men. Know how much alcohol is in your drink. In the [place] one drink equals one 12 oz bottle of beer (355 mL), one 5 oz glass of wine (148 mL), or one 1 oz glass of hard liquor (44 mL). Keep yourself hydrated with water, diet soda, or unsweetened iced tea. Keep in mind that regular soda, juice, and other mixers may contain a lot of sugar and must be counted as carbs. What are tips for following this plan?  Reading food labels Start by checking the serving size on the Nutrition Facts label of packaged foods and drinks. The number of calories and the amount of carbs, fats, and other nutrients listed on the label are based on one serving of the item. Many items contain more than one serving per package. Check the total grams (g) of carbs in one serving. Check the number of grams of saturated fats and trans fats in one serving. Choose foods that have a low amount or none of these fats. Check the number of milligrams (mg) of salt (sodium) in one serving. Most  people should limit total sodium intake to less than 2,300 mg per day. Always check the nutrition information of foods labeled as "low-fat" or "nonfat." These foods may be higher in added sugar or refined carbs and should be avoided. Talk to your dietitian to identify your daily goals for nutrients listed on the label. Shopping Avoid buying canned, pre-made, or processed foods. These foods tend to be high in fat, sodium, and added sugar. Shop around the outside edge of the grocery store. This is where you will most often find fresh fruits and vegetables, bulk grains, fresh meats, and fresh dairy products. Cooking Use low-heat cooking methods, such as baking, instead of high-heat cooking methods, such as deep frying. Cook using healthy oils, such as olive, canola, or sunflower oil. Avoid cooking with butter, cream, or high-fat meats. Meal planning Eat meals and snacks regularly, preferably at the same times every day. Avoid going long periods of time without eating. Eat foods that are high in fiber, such as fresh fruits, vegetables, beans, and whole grains. Eat 4-6 oz (112-168 g) of lean protein each day, such as lean meat, chicken, fish, eggs, or tofu. One ounce (oz) (28 g) of lean protein is equal to: 1 oz (28 g) of meat, chicken, or fish. 1 egg.  cup (62 g) of tofu. Eat some foods each day that contain healthy fats, such as avocado, nuts, seeds, and fish. What foods should I eat? Fruits Berries. Apples. Oranges. Peaches. Apricots. Plums. Grapes. Mangoes. Papayas. Pomegranates. Kiwi.  Cherries. Vegetables Leafy greens, including lettuce, spinach, kale, chard, collard greens, mustard greens, and cabbage. Beets. Cauliflower. Broccoli. Carrots. Green beans. Tomatoes. Peppers. Onions. Cucumbers. Brussels sprouts. Grains Whole grains, such as whole-wheat or whole-grain bread, crackers, tortillas, cereal, and pasta. Unsweetened oatmeal. Quinoa. Brown or wild rice. Meats and other  proteins Seafood. Poultry without skin. Lean cuts of poultry and beef. Tofu. Nuts. Seeds. Dairy Low-fat or fat-free dairy products such as milk, yogurt, and cheese. The items listed above may not be a complete list of foods and beverages you can eat and drink. Contact a dietitian for more information. What foods should I avoid? Fruits Fruits canned with syrup. Vegetables Canned vegetables. Frozen vegetables with butter or cream sauce. Grains Refined white flour and flour products such as bread, pasta, snack foods, and cereals. Avoid all processed foods. Meats and other proteins Fatty cuts of meat. Poultry with skin. Breaded or fried meats. Processed meat. Avoid saturated fats. Dairy Full-fat yogurt, cheese, or milk. Beverages Sweetened drinks, such as soda or iced tea. The items listed above may not be a complete list of foods and beverages you should avoid. Contact a dietitian for more information. Questions to ask a health care provider Do I need to meet with a certified diabetes care and education specialist? Do I need to meet with a dietitian? What number can I call if I have questions? When are the best times to check my blood glucose? Where to find more information: American Diabetes Association: diabetes.org Academy of Nutrition and Dietetics: eatright.[employer] of Diabetes and Digestive and Kidney Diseases: [employer] Association of Diabetes Care & Education Specialists: diabeteseducator.org Summary It is important to have healthy eating habits because your blood sugar (glucose) levels are greatly affected by what you eat and drink. It is important to use alcohol carefully. A healthy meal plan will help you manage your blood glucose and lower your risk of heart disease. Your health care provider may recommend that you work with a dietitian to make a meal plan that is best for you. This information is not intended to replace advice given to you by your health  care provider. Make sure you discuss any questions you have with your health care provider. Document Revised: 12/25/2019 Document Reviewed: 12/25/2019 Elsevier Patient Education  [place], MD [facility]

## 2022-05-05 ENCOUNTER — Other Ambulatory Visit: Payer: Self-pay | Admitting: Emergency Medicine

## 2022-05-05 DIAGNOSIS — I10 Essential (primary) hypertension: Secondary | ICD-10-CM

## 2022-05-28 ENCOUNTER — Other Ambulatory Visit: Payer: Self-pay | Admitting: Emergency Medicine

## 2022-05-28 DIAGNOSIS — I10 Essential (primary) hypertension: Secondary | ICD-10-CM

## 2022-06-04 ENCOUNTER — Other Ambulatory Visit: Payer: Self-pay | Admitting: Cardiology

## 2022-07-19 ENCOUNTER — Encounter: Payer: Self-pay | Admitting: Emergency Medicine

## 2022-07-19 ENCOUNTER — Ambulatory Visit: Payer: No Typology Code available for payment source | Admitting: Emergency Medicine

## 2022-07-19 VITALS — BP 138/76 | HR 97 | Temp 98.5°F | Ht 70.0 in | Wt 204.0 lb

## 2022-07-19 DIAGNOSIS — E1169 Type 2 diabetes mellitus with other specified complication: Secondary | ICD-10-CM

## 2022-07-19 DIAGNOSIS — E1159 Type 2 diabetes mellitus with other circulatory complications: Secondary | ICD-10-CM

## 2022-07-19 DIAGNOSIS — Z720 Tobacco use: Secondary | ICD-10-CM | POA: Diagnosis not present

## 2022-07-19 DIAGNOSIS — E785 Hyperlipidemia, unspecified: Secondary | ICD-10-CM

## 2022-07-19 DIAGNOSIS — I152 Hypertension secondary to endocrine disorders: Secondary | ICD-10-CM

## 2022-07-19 DIAGNOSIS — R109 Unspecified abdominal pain: Secondary | ICD-10-CM | POA: Diagnosis not present

## 2022-07-19 MED ORDER — TRULICITY 1.5 MG/0.5ML ~~LOC~~ SOAJ: SUBCUTANEOUS | 3 refills | Status: DC

## 2022-07-19 NOTE — Assessment & Plan Note (Signed)
Stable.  Diet and nutrition discussed.  Continue atorvastatin 80 mg daily.

## 2022-07-19 NOTE — Assessment & Plan Note (Signed)
Well-controlled hypertension.  Continue amlodipine 10 mg, carvedilol 25 mg twice a day and lisinopril 40 mg daily. BP Readings from Last 3 Encounters:  07/19/22 138/76  04/25/22 132/80  01/10/22 (!) 140/90  Well-controlled diabetes.  Continue metformin 1000 mg twice a day, weekly Trulicity 1.5 mg and Jardiance 10 mg daily. Diet and nutrition discussed Cardiovascular risk associated with hypertension and diabetes discussed Follow-up as scheduled

## 2022-07-19 NOTE — Patient Instructions (Signed)
Abdominal Pain, Adult Many things can cause belly (abdominal) pain. Most times, belly pain is not dangerous. Many cases of belly pain can be watched and treated at home. Sometimes, though, belly pain is serious. Your doctor will try to find the cause of your belly pain. Follow these instructions at home:  Medicines Take over-the-counter and prescription medicines only as told by your doctor. Do not take medicines that help you poop (laxatives) unless told by your doctor. General instructions Watch your belly pain for any changes. Drink enough fluid to keep your pee (urine) pale yellow. Keep all follow-up visits as told by your doctor. This is important. Contact a doctor if: Your belly pain changes or gets worse. You are not hungry, or you lose weight without trying. You are having trouble pooping (constipated) or have watery poop (diarrhea) for more than 2-3 days. You have pain when you pee or poop. Your belly pain wakes you up at night. Your pain gets worse with meals, after eating, or with certain foods. You are vomiting and cannot keep anything down. You have a fever. You have blood in your pee. Get help right away if: Your pain does not go away as soon as your doctor says it should. You cannot stop vomiting. Your pain is only in areas of your belly, such as the right side or the left lower part of the belly. You have bloody or black poop, or poop that looks like tar. You have very bad pain, cramping, or bloating in your belly. You have signs of not having enough fluid or water in your body (dehydration), such as: Dark pee, very little pee, or no pee. Cracked lips. Dry mouth. Sunken eyes. Sleepiness. Weakness. You have trouble breathing or chest pain. Summary Many cases of belly pain can be watched and treated at home. Watch your belly pain for any changes. Take over-the-counter and prescription medicines only as told by your doctor. Contact a doctor if your belly pain  changes or gets worse. Get help right away if you have very bad pain, cramping, or bloating in your belly. This information is not intended to replace advice given to you by your health care provider. Make sure you discuss any questions you have with your health care provider. Document Revised: 10/01/2018 Document Reviewed: 10/01/2018 Elsevier Patient Education  2023 [employer]vier Inc.  

## 2022-07-19 NOTE — Progress Notes (Signed)
Clifford Frederick 57 y.o.   Chief Complaint  Patient presents with   Acute Visit    Abd pain, patient thinks he may of pulled something,pain is near the middle of his abd. Off and on     HISTORY OF PRESENT ILLNESS: Acute problem visit today. This is a 57 y.o. male diabetic and hypertensive complaining of mid abdominal pain that started about 8 days ago while at work. Physical job.  Was moving things around and turning and twisting.  Pain comes and goes and varies with movement.  Occasionally gets sharp mid abdominal pain lasting only few seconds and not associated with any other significant symptoms.  Able to eat and drink.  Denies nausea or vomiting.  Denies fever or chills.  Denies flulike symptoms.  Able to move his bowels.  Denies rectal bleeding or melena.  No other associated symptoms.  No need to take pain medications. No other complaint or medical concerns today.  HPI   Prior to Admission medications   Medication Sig Start Date End Date Taking? Authorizing Provider  amLODipine (NORVASC) 10 MG tablet Take 1 tablet (10 mg total) by mouth daily. 01/10/22  Yes Bhagat, Bhavinkumar, PA  aspirin 81 MG chewable tablet Chew 1 tablet (81 mg total) by mouth daily. 08/18/17  Yes Daune Perch, NP  atorvastatin (LIPITOR) 80 MG tablet TAKE 1 TABLET BY MOUTH ONCE DAILY (AT  6PM) 05/06/22  Yes Hanifa Antonetti, Ines Bloomer, MD  blood glucose meter kit and supplies Dispense based on patient and insurance preference. Use up to four times daily as directed. (FOR ICD-10 E10.9, E11.9). 06/04/18  Yes Posey Boyer, MD  carvedilol (COREG) 25 MG tablet TAKE 1 TABLET BY MOUTH TWICE DAILY WITH A MEAL 06/07/22  Yes Turner, Eber Hong, MD  clopidogrel (PLAVIX) 75 MG tablet Take 1 tablet (75 mg total) by mouth daily. . 01/10/22  Yes Bhagat, Bhavinkumar, PA  cyclobenzaprine (FLEXERIL) 5 MG tablet Take 1 tablet (5 mg total) by mouth 3 (three) times daily as needed for muscle spasms. 08/28/20  Yes Maximiano Coss, NP  Dulaglutide  (TRULICITY) 1.5 0000000 SOPN INJECT 1.5 MG INTO THE SKIN  SUBCUTANEOUSLY ONCE A WEEK 04/25/22  Yes Odaliz Mcqueary, Ines Bloomer, MD  empagliflozin (JARDIANCE) 10 MG TABS tablet Take 1 tablet by mouth once daily 05/06/22  Yes Darryel Diodato, Ines Bloomer, MD  lisinopril (ZESTRIL) 40 MG tablet Take 1 tablet by mouth once daily 05/28/22  Yes Emmi Wertheim, Ines Bloomer, MD  metFORMIN (GLUCOPHAGE) 1000 MG tablet TAKE 1 TABLET BY MOUTH TWICE DAILY WITH MEALS 12/31/21  Yes Kavan Devan, Ines Bloomer, MD  sildenafil (VIAGRA) 100 MG tablet Take 0.5-1 tablets (50-100 mg total) by mouth daily as needed for erectile dysfunction. 10/20/21  Yes Horald Pollen, MD    No Known Allergies  Patient Active Problem List   Diagnosis Date Noted   Hypertension associated with type 2 diabetes mellitus ([place]) 07/17/2019   CAD in native artery    Pure hypercholesterolemia    Tobacco abuse 08/15/2017   Dyslipidemia associated with type 2 diabetes mellitus ([place]) 08/15/2017   Normocytic anemia 08/15/2017   Essential hypertension, benign 08/08/2012    Past Medical History:  Diagnosis Date   Abnormal EKG    Acute coronary syndrome ([place]) 08/15/2017   CAD in native artery    [facility] 08/16/17- DES to RCA   Chest pain 08/15/2017   Diabetes mellitus type 2 in obese ([place]) 08/15/2017   Essential hypertension, benign 08/08/2012   Fracture of lateral malleolus of  left ankle 08/07/2012   High cholesterol    Hypertensive urgency 08/15/2017   Sleep apnea    Tobacco abuse 08/15/2017   Unstable angina [facility])     Past Surgical History:  Procedure Laterality Date   CORONARY STENT INTERVENTION N/A 08/16/2017   Procedure: CORONARY STENT INTERVENTION;  Surgeon: Jettie Booze, MD;  Location: [place] [facility];  Service: Cardiovascular;  Laterality: N/A;   LEFT HEART CATH AND CORONARY ANGIOGRAPHY N/A 08/16/2017   Procedure: LEFT HEART CATH AND CORONARY ANGIOGRAPHY;  Surgeon: Jettie Booze, MD;  Location: [place] [facility];  Service:  Cardiovascular;  Laterality: N/A;   ORIF ANKLE FRACTURE Left 08/07/2012   Procedure: OPEN REDUCTION INTERNAL FIXATION (ORIF) LEFT ANKLE FRACTURE;  Surgeon: Mcarthur Rossetti, MD;  Location: [place];  Service: Orthopedics;  Laterality: Left;    Social History   Socioeconomic History   Marital status: Married    Spouse name: Not on file   Number of children: Not on file   Years of education: Not on file   Highest education level: Not on file  Occupational History   Not on file  Tobacco Use   Smoking status: Some Days    Packs/day: 0.25    Years: 20.00    Total pack years: 5.00    Types: Cigarettes    Start date: 08/16/2017   Smokeless tobacco: Never   Tobacco comments:    currently smokes 3 cigarettes a day  Vaping Use   Vaping Use: Never used  Substance and Sexual Activity   Alcohol use: Yes    Alcohol/week: 6.0 standard drinks of alcohol    Types: 6 Cans of beer per week    Comment: occ   Drug use: No   Sexual activity: Not on file  Other Topics Concern   Not on file  Social History Narrative   Not on file   Social Determinants of Health   Financial Resource Strain: Not on file  Food Insecurity: Not on file  Transportation Needs: Not on file  Physical Activity: Not on file  Stress: Not on file  Social Connections: Not on file  Intimate Partner Violence: Not on file    Family History  Problem Relation Age of Onset   Diabetes Mother    Hypertension Mother    Diabetes Brother    Hypertension Brother      Review of Systems  Constitutional: Negative.  Negative for chills and fever.  HENT: Negative.  Negative for congestion and sore throat.   Respiratory: Negative.  Negative for cough and shortness of breath.   Cardiovascular: Negative.  Negative for chest pain and palpitations.  Gastrointestinal:  Negative for abdominal pain, diarrhea, nausea and vomiting.  Genitourinary: Negative.  Negative for dysuria and hematuria.  Skin: Negative.  Negative for rash.   Neurological: Negative.  Negative for dizziness and headaches.  All other systems reviewed and are negative.   Today's Vitals   07/19/22 0839  BP: 138/76  Pulse: 97  Temp: 98.5 F (36.9 C)  TempSrc: Oral  SpO2: 99%  Weight: 204 lb (92.5 kg)  Height: 5' 10"$  (1.778 m)   Body mass index is 29.27 kg/m. Wt Readings from Last 3 Encounters:  07/19/22 204 lb (92.5 kg)  04/25/22 209 lb (94.8 kg)  01/10/22 213 lb (96.6 kg)   Lab Results  Component Value Date   HGBA1C 5.9 (A) 04/25/2022    Physical Exam Vitals reviewed.  Constitutional:      Appearance: Normal appearance.  HENT:     Head: Normocephalic.  Eyes:     Extraocular Movements: Extraocular movements intact.     Pupils: Pupils are equal, round, and reactive to light.  Cardiovascular:     Rate and Rhythm: Normal rate and regular rhythm.     Pulses: Normal pulses.     Heart sounds: Normal heart sounds.  Pulmonary:     Effort: Pulmonary effort is normal.     Breath sounds: Normal breath sounds.  Abdominal:     General: Bowel sounds are normal. There is no distension.     Palpations: Abdomen is soft.     Tenderness: There is no abdominal tenderness. There is no right CVA tenderness, left CVA tenderness or guarding.  Musculoskeletal:        General: Normal range of motion.     Cervical back: No tenderness.  Lymphadenopathy:     Cervical: No cervical adenopathy.  Skin:    General: Skin is warm and dry.  Neurological:     General: No focal deficit present.     Mental Status: He is alert and oriented to person, place, and time.  Psychiatric:        Mood and Affect: Mood normal.        Behavior: Behavior normal.      ASSESSMENT & PLAN: A total of 44 minutes was spent with the patient and counseling/coordination of care regarding preparing for this visit, review of most recent office visit notes, review of multiple chronic medical conditions under management, review of all medications, differential diagnosis of  abdominal pain, pain management, ED precautions, education on nutrition, prognosis, documentation, need for follow-up if no better or worse during the next several days.  Problem List Items Addressed This Visit       Cardiovascular and Mediastinum   Hypertension associated with type 2 diabetes mellitus ([place])    Well-controlled hypertension.  Continue amlodipine 10 mg, carvedilol 25 mg twice a day and lisinopril 40 mg daily. BP Readings from Last 3 Encounters:  07/19/22 138/76  04/25/22 132/80  01/10/22 (!) 140/90  Well-controlled diabetes.  Continue metformin 1000 mg twice a day, weekly Trulicity 1.5 mg and Jardiance 10 mg daily. Diet and nutrition discussed Cardiovascular risk associated with hypertension and diabetes discussed Follow-up as scheduled       Relevant Medications   Dulaglutide (TRULICITY) 1.5 0000000 SOPN     Endocrine   Dyslipidemia associated with type 2 diabetes mellitus ([place])    Stable.  Diet and nutrition discussed.  Continue atorvastatin 80 mg daily.       Relevant Medications   Dulaglutide (TRULICITY) 1.5 0000000 SOPN     Other   Tobacco abuse   Abdominal wall pain - Primary    Clinically stable.  No red flag signs or symptoms. Stable vital signs and benign abdominal examination. Differential diagnosis discussed.  Most likely musculoskeletal abdominal wall pain related to work activities. Pain management discussed.  May take Tylenol and or Advil as needed. ED precautions given. Advised to contact the office if no better or worse or clinical picture changes during the next several days.      Patient Instructions  Abdominal Pain, Adult Many things can cause belly (abdominal) pain. Most times, belly pain is not dangerous. Many cases of belly pain can be watched and treated at home. Sometimes, though, belly pain is serious. Your doctor will try to find the cause of your belly pain. Follow these instructions at home:  Medicines Take over-the-counter  and  prescription medicines only as told by your doctor. Do not take medicines that help you poop (laxatives) unless told by your doctor. General instructions Watch your belly pain for any changes. Drink enough fluid to keep your pee (urine) pale yellow. Keep all follow-up visits as told by your doctor. This is important. Contact a doctor if: Your belly pain changes or gets worse. You are not hungry, or you lose weight without trying. You are having trouble pooping (constipated) or have watery poop (diarrhea) for more than 2-3 days. You have pain when you pee or poop. Your belly pain wakes you up at night. Your pain gets worse with meals, after eating, or with certain foods. You are vomiting and cannot keep anything down. You have a fever. You have blood in your pee. Get help right away if: Your pain does not go away as soon as your doctor says it should. You cannot stop vomiting. Your pain is only in areas of your belly, such as the right side or the left lower part of the belly. You have bloody or black poop, or poop that looks like tar. You have very bad pain, cramping, or bloating in your belly. You have signs of not having enough fluid or water in your body (dehydration), such as: Dark pee, very little pee, or no pee. Cracked lips. Dry mouth. Sunken eyes. Sleepiness. Weakness. You have trouble breathing or chest pain. Summary Many cases of belly pain can be watched and treated at home. Watch your belly pain for any changes. Take over-the-counter and prescription medicines only as told by your doctor. Contact a doctor if your belly pain changes or gets worse. Get help right away if you have very bad pain, cramping, or bloating in your belly. This information is not intended to replace advice given to you by your health care provider. Make sure you discuss any questions you have with your health care provider. Document Revised: 10/01/2018 Document Reviewed: 10/01/2018 Elsevier  Patient Education  Melfa, MD [facility]

## 2022-07-19 NOTE — Assessment & Plan Note (Signed)
Clinically stable.  No red flag signs or symptoms. Stable vital signs and benign abdominal examination. Differential diagnosis discussed.  Most likely musculoskeletal abdominal wall pain related to work activities. Pain management discussed.  May take Tylenol and or Advil as needed. ED precautions given. Advised to contact the office if no better or worse or clinical picture changes during the next several days.

## 2022-08-06 ENCOUNTER — Other Ambulatory Visit: Payer: Self-pay | Admitting: Emergency Medicine

## 2022-08-06 DIAGNOSIS — I152 Hypertension secondary to endocrine disorders: Secondary | ICD-10-CM

## 2022-08-25 ENCOUNTER — Other Ambulatory Visit: Payer: Self-pay | Admitting: Emergency Medicine

## 2022-08-25 DIAGNOSIS — I10 Essential (primary) hypertension: Secondary | ICD-10-CM

## 2022-10-12 ENCOUNTER — Other Ambulatory Visit: Payer: Self-pay | Admitting: Emergency Medicine

## 2022-10-25 ENCOUNTER — Ambulatory Visit: Payer: No Typology Code available for payment source | Admitting: Emergency Medicine

## 2022-10-25 ENCOUNTER — Encounter: Payer: Self-pay | Admitting: Emergency Medicine

## 2022-10-25 VITALS — BP 134/80 | HR 90 | Temp 98.6°F | Ht 70.0 in | Wt 202.2 lb

## 2022-10-25 DIAGNOSIS — Z23 Encounter for immunization: Secondary | ICD-10-CM

## 2022-10-25 DIAGNOSIS — E1159 Type 2 diabetes mellitus with other circulatory complications: Secondary | ICD-10-CM | POA: Diagnosis not present

## 2022-10-25 DIAGNOSIS — Z7985 Long-term (current) use of injectable non-insulin antidiabetic drugs: Secondary | ICD-10-CM

## 2022-10-25 DIAGNOSIS — L84 Corns and callosities: Secondary | ICD-10-CM

## 2022-10-25 DIAGNOSIS — I2510 Atherosclerotic heart disease of native coronary artery without angina pectoris: Secondary | ICD-10-CM

## 2022-10-25 DIAGNOSIS — Z720 Tobacco use: Secondary | ICD-10-CM | POA: Diagnosis not present

## 2022-10-25 DIAGNOSIS — E785 Hyperlipidemia, unspecified: Secondary | ICD-10-CM

## 2022-10-25 DIAGNOSIS — I152 Hypertension secondary to endocrine disorders: Secondary | ICD-10-CM

## 2022-10-25 DIAGNOSIS — E1169 Type 2 diabetes mellitus with other specified complication: Secondary | ICD-10-CM | POA: Diagnosis not present

## 2022-10-25 DIAGNOSIS — Z7984 Long term (current) use of oral hypoglycemic drugs: Secondary | ICD-10-CM

## 2022-10-25 DIAGNOSIS — Z1211 Encounter for screening for malignant neoplasm of colon: Secondary | ICD-10-CM

## 2022-10-25 LAB — CBC WITH DIFFERENTIAL/PLATELET
Basophils Absolute: 0 10*3/uL (ref 0.0–0.1)
Basophils Relative: 0.5 % (ref 0.0–3.0)
Eosinophils Absolute: 0.2 10*3/uL (ref 0.0–0.7)
Eosinophils Relative: 2.4 % (ref 0.0–5.0)
HCT: 41.4 % (ref 39.0–52.0)
Hemoglobin: 13.8 g/dL (ref 13.0–17.0)
Lymphocytes Relative: 26 % (ref 12.0–46.0)
Lymphs Abs: 2 10*3/uL (ref 0.7–4.0)
MCHC: 33.3 g/dL (ref 30.0–36.0)
MCV: 91.5 fl (ref 78.0–100.0)
Monocytes Absolute: 0.6 10*3/uL (ref 0.1–1.0)
Monocytes Relative: 7.9 % (ref 3.0–12.0)
Neutro Abs: 4.8 10*3/uL (ref 1.4–7.7)
Neutrophils Relative %: 63.2 % (ref 43.0–77.0)
Platelets: 187 10*3/uL (ref 150.0–400.0)
RBC: 4.52 Mil/uL (ref 4.22–5.81)
RDW: 15 % (ref 11.5–15.5)
WBC: 7.6 10*3/uL (ref 4.0–10.5)

## 2022-10-25 LAB — LIPID PANEL
Cholesterol: 99 mg/dL (ref 0–200)
HDL: 38 mg/dL — ABNORMAL LOW (ref >39.00)
LDL Cholesterol: 45 mg/dL (ref 0–99)
NonHDL: 61.24
Total CHOL/HDL Ratio: 3
Triglycerides: 82 mg/dL (ref 0.0–149.0)
VLDL: 16.4 mg/dL (ref 0.0–40.0)

## 2022-10-25 LAB — MICROALBUMIN / CREATININE URINE RATIO
Creatinine,U: 89.5 mg/dL
Microalb Creat Ratio: 2.6 mg/g (ref 0.0–30.0)
Microalb, Ur: 2.3 mg/dL — ABNORMAL HIGH (ref 0.0–1.9)

## 2022-10-25 LAB — COMPREHENSIVE METABOLIC PANEL
ALT: 20 U/L (ref 0–53)
AST: 21 U/L (ref 0–37)
Albumin: 4.3 g/dL (ref 3.5–5.2)
Alkaline Phosphatase: 42 U/L (ref 39–117)
BUN: 11 mg/dL (ref 6–23)
CO2: 27 mEq/L (ref 19–32)
Calcium: 9.7 mg/dL (ref 8.4–10.5)
Chloride: 104 mEq/L (ref 96–112)
Creatinine, Ser: 0.97 mg/dL (ref 0.40–1.50)
GFR: 87.17 mL/min (ref >60.00)
Glucose, Bld: 94 mg/dL (ref 70–99)
Potassium: 3.8 mEq/L (ref 3.5–5.1)
Sodium: 140 mEq/L (ref 135–145)
Total Bilirubin: 0.6 mg/dL (ref 0.2–1.2)
Total Protein: 7 g/dL (ref 6.0–8.3)

## 2022-10-25 LAB — POCT GLYCOSYLATED HEMOGLOBIN (HGB A1C): Hemoglobin A1C: 5.8 % — AB (ref 4.0–5.6)

## 2022-10-25 NOTE — Patient Instructions (Signed)
Health Maintenance, Male Adopting a healthy lifestyle and getting preventive care are important in promoting health and wellness. Ask your health care provider about: The right schedule for you to have regular tests and exams. Things you can do on your own to prevent diseases and keep yourself healthy. What should I know about diet, weight, and exercise? Eat a healthy diet  Eat a diet that includes plenty of vegetables, fruits, low-fat dairy products, and lean protein. Do not eat a lot of foods that are high in solid fats, added sugars, or sodium. Maintain a healthy weight Body mass index (BMI) is a measurement that can be used to identify possible weight problems. It estimates body fat based on height and weight. Your health care provider can help determine your BMI and help you achieve or maintain a healthy weight. Get regular exercise Get regular exercise. This is one of the most important things you can do for your health. Most adults should: Exercise for at least 150 minutes each week. The exercise should increase your heart rate and make you sweat (moderate-intensity exercise). Do strengthening exercises at least twice a week. This is in addition to the moderate-intensity exercise. Spend less time sitting. Even light physical activity can be beneficial. Watch cholesterol and blood lipids Have your blood tested for lipids and cholesterol at 57 years of age, then have this test every 5 years. You may need to have your cholesterol levels checked more often if: Your lipid or cholesterol levels are high. You are older than 57 years of age. You are at high risk for heart disease. What should I know about cancer screening? Many types of cancers can be detected early and may often be prevented. Depending on your health history and family history, you may need to have cancer screening at various ages. This may include screening for: Colorectal cancer. Prostate cancer. Skin cancer. Lung  cancer. What should I know about heart disease, diabetes, and high blood pressure? Blood pressure and heart disease High blood pressure causes heart disease and increases the risk of stroke. This is more likely to develop in people who have high blood pressure readings or are overweight. Talk with your health care provider about your target blood pressure readings. Have your blood pressure checked: Every 3-5 years if you are 18-39 years of age. Every year if you are 40 years old or older. If you are between the ages of 65 and 75 and are a current or former smoker, ask your health care provider if you should have a one-time screening for abdominal aortic aneurysm (AAA). Diabetes Have regular diabetes screenings. This checks your fasting blood sugar level. Have the screening done: Once every three years after age 45 if you are at a normal weight and have a low risk for diabetes. More often and at a younger age if you are overweight or have a high risk for diabetes. What should I know about preventing infection? Hepatitis B If you have a higher risk for hepatitis B, you should be screened for this virus. Talk with your health care provider to find out if you are at risk for hepatitis B infection. Hepatitis C Blood testing is recommended for: Everyone born from 1945 through 1965. Anyone with known risk factors for hepatitis C. Sexually transmitted infections (STIs) You should be screened each year for STIs, including gonorrhea and chlamydia, if: You are sexually active and are younger than 57 years of age. You are older than 57 years of age and your   health care provider tells you that you are at risk for this type of infection. Your sexual activity has changed since you were last screened, and you are at increased risk for chlamydia or gonorrhea. Ask your health care provider if you are at risk. Ask your health care provider about whether you are at high risk for HIV. Your health care provider  may recommend a prescription medicine to help prevent HIV infection. If you choose to take medicine to prevent HIV, you should first get tested for HIV. You should then be tested every 3 months for as long as you are taking the medicine. Follow these instructions at home: Alcohol use Do not drink alcohol if your health care provider tells you not to drink. If you drink alcohol: Limit how much you have to 0-2 drinks a day. Know how much alcohol is in your drink. In the [place] one drink equals one 12 oz bottle of beer (355 mL), one 5 oz glass of wine (148 mL), or one 1 oz glass of hard liquor (44 mL). Lifestyle Do not use any products that contain nicotine or tobacco. These products include cigarettes, chewing tobacco, and vaping devices, such as e-cigarettes. If you need help quitting, ask your health care provider. Do not use street drugs. Do not share needles. Ask your health care provider for help if you need support or information about quitting drugs. General instructions Schedule regular health, dental, and eye exams. Stay current with your vaccines. Tell your health care provider if: You often feel depressed. You have ever been abused or do not feel safe at home. Summary Adopting a healthy lifestyle and getting preventive care are important in promoting health and wellness. Follow your health care provider's instructions about healthy diet, exercising, and getting tested or screened for diseases. Follow your health care provider's instructions on monitoring your cholesterol and blood pressure. This information is not intended to replace advice given to you by your health care provider. Make sure you discuss any questions you have with your health care provider. Document Revised: 10/12/2020 Document Reviewed: 10/12/2020 Elsevier Patient Education  2023 [employer]  

## 2022-10-25 NOTE — Progress Notes (Signed)
Clifford Frederick 57 y.o.   Chief Complaint  Patient presents with   Medical Management of Chronic Issues    57 f/u appt, patient had an injury in the past to his foot and now he has a callus.  Referral to podiatry     HISTORY OF PRESENT ILLNESS: This is a 57 y.o. male here for 66-month follow-up of chronic medical problems including diabetes, hypertension, and dyslipidemia Overall doing well. Still smoking but less. Exercises regularly with the job.  Walks a lot. Complaining of pain to right foot, calluses.  Requesting podiatry referral Has no other complaints or medical concerns today. BP Readings from Last 3 Encounters:  07/19/22 138/76  04/25/22 132/80  01/10/22 (!) 140/90   Wt Readings from Last 3 Encounters:  10/25/22 202 lb 4 oz (91.7 kg)  07/19/22 204 lb (92.5 kg)  04/25/22 209 lb (94.8 kg)   Lab Results  Component Value Date   HGBA1C 5.9 (A) 04/25/2022     HPI   Prior to Admission medications   Medication Sig Start Date End Date Taking? Authorizing Provider  amLODipine (NORVASC) 10 MG tablet Take 1 tablet (10 mg total) by mouth daily. 01/10/22  Yes Bhagat, Bhavinkumar, PA  aspirin 81 MG chewable tablet Chew 1 tablet (81 mg total) by mouth daily. 08/18/17  Yes Berton Bon, NP  atorvastatin (LIPITOR) 80 MG tablet TAKE 1 TABLET BY MOUTH ONCE DAILY (AT  6PM) 05/06/22  Yes Selvin Yun, Eilleen Kempf, MD  blood glucose meter kit and supplies Dispense based on patient and insurance preference. Use up to four times daily as directed. (FOR ICD-10 E10.9, E11.9). 06/04/18  Yes Peyton Najjar, MD  carvedilol (COREG) 25 MG tablet TAKE 1 TABLET BY MOUTH TWICE DAILY WITH A MEAL 06/07/22  Yes Turner, Cornelious Bryant, MD  clopidogrel (PLAVIX) 75 MG tablet Take 1 tablet (75 mg total) by mouth daily. . 01/10/22  Yes Bhagat, Bhavinkumar, PA  cyclobenzaprine (FLEXERIL) 5 MG tablet Take 1 tablet (5 mg total) by mouth 3 (three) times daily as needed for muscle spasms. 08/28/20  Yes Janeece Agee, NP   Dulaglutide (TRULICITY) 1.5 MG/0.5ML SOPN INJECT 1.5 MG INTO THE SKIN  SUBCUTANEOUSLY ONCE A WEEK 07/19/22  Yes Mahayla Haddaway, Eilleen Kempf, MD  empagliflozin (JARDIANCE) 10 MG TABS tablet Take 1 tablet by mouth once daily 05/06/22  Yes Terissa Haffey, Eilleen Kempf, MD  lisinopril (ZESTRIL) 40 MG tablet Take 1 tablet by mouth once daily 08/25/22  Yes Sally-Ann Cutbirth, Eilleen Kempf, MD  metFORMIN (GLUCOPHAGE) 1000 MG tablet TAKE 1 TABLET BY MOUTH TWICE DAILY WITH MEALS 08/06/22  Yes Sincere Berlanga, Eilleen Kempf, MD  sildenafil (VIAGRA) 100 MG tablet Take 0.5-1 tablets (50-100 mg total) by mouth daily as needed for erectile dysfunction. 10/20/21  Yes Georgina Quint, MD    No Known Allergies  Patient Active Problem List   Diagnosis Date Noted   Hypertension associated with type 2 diabetes mellitus (HCC) 07/17/2019   CAD in native artery    Pure hypercholesterolemia    Tobacco abuse 08/15/2017   Dyslipidemia associated with type 2 diabetes mellitus (HCC) 08/15/2017   Normocytic anemia 08/15/2017   Essential hypertension, benign 08/08/2012    Past Medical History:  Diagnosis Date   Abnormal EKG    Acute coronary syndrome (HCC) 08/15/2017   CAD in native artery    A. LHC 08/16/17- DES to RCA   Chest pain 08/15/2017   Diabetes mellitus type 2 in obese 08/15/2017   Essential hypertension, benign 08/08/2012   Fracture  of lateral malleolus of left ankle 08/07/2012   High cholesterol    Hypertensive urgency 08/15/2017   Sleep apnea    Tobacco abuse 08/15/2017   Unstable angina [facility])     Past Surgical History:  Procedure Laterality Date   CORONARY STENT INTERVENTION N/A 08/16/2017   Procedure: CORONARY STENT INTERVENTION;  Surgeon: Corky Crafts, MD;  Location: [facility] INVASIVE CV LAB;  Service: Cardiovascular;  Laterality: N/A;   LEFT HEART CATH AND CORONARY ANGIOGRAPHY N/A 08/16/2017   Procedure: LEFT HEART CATH AND CORONARY ANGIOGRAPHY;  Surgeon: Corky Crafts, MD;  Location: [facility] INVASIVE CV LAB;  Service:  Cardiovascular;  Laterality: N/A;   ORIF ANKLE FRACTURE Left 08/07/2012   Procedure: OPEN REDUCTION INTERNAL FIXATION (ORIF) LEFT ANKLE FRACTURE;  Surgeon: Kathryne Hitch, MD;  Location: [facility] OR;  Service: Orthopedics;  Laterality: Left;    Social History   Socioeconomic History   Marital status: Married    Spouse name: Not on file   Number of children: Not on file   Years of education: Not on file   Highest education level: Not on file  Occupational History   Not on file  Tobacco Use   Smoking status: Some Days    Packs/day: 0.25    Years: 20.00    Additional pack years: 0.00    Total pack years: 5.00    Types: Cigarettes    Start date: 08/16/2017   Smokeless tobacco: Never   Tobacco comments:    currently smokes 3 cigarettes a day  Vaping Use   Vaping Use: Never used  Substance and Sexual Activity   Alcohol use: Yes    Alcohol/week: 6.0 standard drinks of alcohol    Types: 6 Cans of beer per week    Comment: occ   Drug use: No   Sexual activity: Not on file  Other Topics Concern   Not on file  Social History Narrative   Not on file   Social Determinants of Health   Financial Resource Strain: Not on file  Food Insecurity: Not on file  Transportation Needs: Not on file  Physical Activity: Not on file  Stress: Not on file  Social Connections: Not on file  Intimate Partner Violence: Not on file    Family History  Problem Relation Age of Onset   Diabetes Mother    Hypertension Mother    Diabetes Brother    Hypertension Brother      Review of Systems  Constitutional: Negative.  Negative for chills and fever.  HENT: Negative.  Negative for congestion and sore throat.   Respiratory: Negative.  Negative for cough and shortness of breath.   Cardiovascular: Negative.  Negative for chest pain and palpitations.  Gastrointestinal:  Negative for abdominal pain, diarrhea, nausea and vomiting.  Genitourinary: Negative.  Negative for dysuria and hematuria.  Skin:  Negative.  Negative for rash.  Neurological: Negative.  Negative for dizziness and headaches.  All other systems reviewed and are negative.   Today's Vitals   10/25/22 0804  BP: 134/80  Pulse: 90  Temp: 98.6 F (37 C)  TempSrc: Oral  SpO2: 98%  Weight: 202 lb 4 oz (91.7 kg)  Height: 5\' 10"  (1.778 m)   Body mass index is 29.02 kg/m.   Physical Exam Vitals reviewed.  Constitutional:      Appearance: Normal appearance.  HENT:     Head: Normocephalic.  Eyes:     Extraocular Movements: Extraocular movements intact.     Pupils: Pupils are  equal, round, and reactive to light.  Cardiovascular:     Rate and Rhythm: Normal rate and regular rhythm.     Pulses: Normal pulses.     Heart sounds: Normal heart sounds.  Pulmonary:     Effort: Pulmonary effort is normal.     Breath sounds: Normal breath sounds.  Musculoskeletal:     Cervical back: No tenderness.     Comments: Right foot: Tender callus plantar surface, midfoot  Lymphadenopathy:     Cervical: No cervical adenopathy.  Skin:    General: Skin is warm and dry.  Neurological:     Mental Status: He is alert and oriented to person, place, and time.  Psychiatric:        Mood and Affect: Mood normal.        Behavior: Behavior normal.    Results for orders placed or performed in visit on 10/25/22 (from the past 24 hour(s))  POCT glycosylated hemoglobin (Hb A1C)     Status: Abnormal   Collection Time: 10/25/22  8:14 AM  Result Value Ref Range   Hemoglobin A1C 5.8 (A) 4.0 - 5.6 %   HbA1c POC (<> result, manual entry)     HbA1c, POC (prediabetic range)     HbA1c, POC (controlled diabetic range)       ASSESSMENT & PLAN: A total of 45 minutes was spent with the patient and counseling/coordination of care regarding preparing for this visit, review of most recent office visit notes, review of most recent blood work results including interpretation of today's hemoglobin A1c, review of multiple chronic medical conditions and  their management, review of all medications, cardiovascular risks associated with hypertension, diabetes, dyslipidemia, education on nutrition, review of health maintenance items and need for colon cancer screening, prognosis, documentation, and need for follow-up.  Problem List Items Addressed This Visit       Cardiovascular and Mediastinum   CAD in native artery    Stable.  Stent placed in the past. Sees cardiologist once a year Continues daily baby aspirin and Plavix 75 mg daily No anginal episodes      Hypertension associated with type 2 diabetes mellitus (HCC) - Primary    Well-controlled hypertension Continue amlodipine 10 mg, lisinopril 40 mg, and carvedilol 25 mg twice a day Well-controlled diabetes with hemoglobin A1c of 5.8. Continue weekly Trulicity at 1.5 mg, Jardiance 10 mg, and metformin 1000 mg twice a day Cardiovascular risk associated with diabetes and hypertension discussed Diet and nutrition discussed Follow-up in 6 months      Relevant Orders   CBC with Differential/Platelet   Comprehensive metabolic panel   Lipid panel   POCT glycosylated hemoglobin (Hb A1C) (Completed)     Endocrine   Dyslipidemia associated with type 2 diabetes mellitus (HCC)    Stable chronic condition Continue atorvastatin 80 mg daily Diet and nutrition discussed Benefit of exercise discussed      Relevant Orders   CBC with Differential/Platelet   Comprehensive metabolic panel   Lipid panel   POCT glycosylated hemoglobin (Hb A1C) (Completed)     Other   Tobacco abuse    Continues to smoke although less. Cardiovascular and cancer risks associated with smoking discussed Smoking cessation advice given      Other Visit Diagnoses     Need for vaccination       Relevant Orders   Zoster Recombinant (Shingrix )   Callus of foot       Relevant Orders   Ambulatory referral to Podiatry  Screening for colon cancer       Relevant Orders   Ambulatory referral to  Gastroenterology      Patient Instructions  Health Maintenance, Male Adopting a healthy lifestyle and getting preventive care are important in promoting health and wellness. Ask your health care provider about: The right schedule for you to have regular tests and exams. Things you can do on your own to prevent diseases and keep yourself healthy. What should I know about diet, weight, and exercise? Eat a healthy diet  Eat a diet that includes plenty of vegetables, fruits, low-fat dairy products, and lean protein. Do not eat a lot of foods that are high in solid fats, added sugars, or sodium. Maintain a healthy weight Body mass index (BMI) is a measurement that can be used to identify possible weight problems. It estimates body fat based on height and weight. Your health care provider can help determine your BMI and help you achieve or maintain a healthy weight. Get regular exercise Get regular exercise. This is one of the most important things you can do for your health. Most adults should: Exercise for at least 150 minutes each week. The exercise should increase your heart rate and make you sweat (moderate-intensity exercise). Do strengthening exercises at least twice a week. This is in addition to the moderate-intensity exercise. Spend less time sitting. Even light physical activity can be beneficial. Watch cholesterol and blood lipids Have your blood tested for lipids and cholesterol at 57 years of age, then have this test every 5 years. You may need to have your cholesterol levels checked more often if: Your lipid or cholesterol levels are high. You are older than 57 years of age. You are at high risk for heart disease. What should I know about cancer screening? Many types of cancers can be detected early and may often be prevented. Depending on your health history and family history, you may need to have cancer screening at various ages. This may include screening for: Colorectal  cancer. Prostate cancer. Skin cancer. Lung cancer. What should I know about heart disease, diabetes, and high blood pressure? Blood pressure and heart disease High blood pressure causes heart disease and increases the risk of stroke. This is more likely to develop in people who have high blood pressure readings or are overweight. Talk with your health care provider about your target blood pressure readings. Have your blood pressure checked: Every 3-5 years if you are 66-61 years of age. Every year if you are 36 years old or older. If you are between the ages of 63 and 35 and are a current or former smoker, ask your health care provider if you should have a one-time screening for abdominal aortic aneurysm (AAA). Diabetes Have regular diabetes screenings. This checks your fasting blood sugar level. Have the screening done: Once every three years after age 39 if you are at a normal weight and have a low risk for diabetes. More often and at a younger age if you are overweight or have a high risk for diabetes. What should I know about preventing infection? Hepatitis B If you have a higher risk for hepatitis B, you should be screened for this virus. Talk with your health care provider to find out if you are at risk for hepatitis B infection. Hepatitis C Blood testing is recommended for: Everyone born from [birth year] through 1965. Anyone with known risk factors for hepatitis C. Sexually transmitted infections (STIs) You should be screened each year for STIs,  including gonorrhea and chlamydia, if: You are sexually active and are younger than 56 years of age. You are older than 57 years of age and your health care provider tells you that you are at risk for this type of infection. Your sexual activity has changed since you were last screened, and you are at increased risk for chlamydia or gonorrhea. Ask your health care provider if you are at risk. Ask your health care provider about whether you are at  high risk for HIV. Your health care provider may recommend a prescription medicine to help prevent HIV infection. If you choose to take medicine to prevent HIV, you should first get tested for HIV. You should then be tested every 3 months for as long as you are taking the medicine. Follow these instructions at home: Alcohol use Do not drink alcohol if your health care provider tells you not to drink. If you drink alcohol: Limit how much you have to 0-2 drinks a day. Know how much alcohol is in your drink. In the [place] one drink equals one 12 oz bottle of beer (355 mL), one 5 oz glass of wine (148 mL), or one 1 oz glass of hard liquor (44 mL). Lifestyle Do not use any products that contain nicotine or tobacco. These products include cigarettes, chewing tobacco, and vaping devices, such as e-cigarettes. If you need help quitting, ask your health care provider. Do not use street drugs. Do not share needles. Ask your health care provider for help if you need support or information about quitting drugs. General instructions Schedule regular health, dental, and eye exams. Stay current with your vaccines. Tell your health care provider if: You often feel depressed. You have ever been abused or do not feel safe at home. Summary Adopting a healthy lifestyle and getting preventive care are important in promoting health and wellness. Follow your health care provider's instructions about healthy diet, exercising, and getting tested or screened for diseases. Follow your health care provider's instructions on monitoring your cholesterol and blood pressure. This information is not intended to replace advice given to you by your health care provider. Make sure you discuss any questions you have with your health care provider. Document Revised: 10/12/2020 Document Reviewed: 10/12/2020 Elsevier Patient Education  2023 Elsevier Inc.     Edwina Barth, MD [facility]

## 2022-10-25 NOTE — Assessment & Plan Note (Signed)
Well-controlled hypertension Continue amlodipine 10 mg, lisinopril 40 mg, and carvedilol 25 mg twice a day Well-controlled diabetes with hemoglobin A1c of 5.8. Continue weekly Trulicity at 1.5 mg, Jardiance 10 mg, and metformin 1000 mg twice a day Cardiovascular risk associated with diabetes and hypertension discussed Diet and nutrition discussed Follow-up in 6 months

## 2022-10-25 NOTE — Assessment & Plan Note (Signed)
Continues to smoke although less. Cardiovascular and cancer risks associated with smoking discussed Smoking cessation advice given

## 2022-10-25 NOTE — Assessment & Plan Note (Signed)
Stable chronic condition Continue atorvastatin 80 mg daily Diet and nutrition discussed Benefit of exercise discussed

## 2022-10-25 NOTE — Assessment & Plan Note (Signed)
Stable.  Stent placed in the past. Sees cardiologist once a year Continues daily baby aspirin and Plavix 75 mg daily No anginal episodes

## 2022-10-31 ENCOUNTER — Other Ambulatory Visit: Payer: Self-pay | Admitting: Emergency Medicine

## 2022-10-31 DIAGNOSIS — E1159 Type 2 diabetes mellitus with other circulatory complications: Secondary | ICD-10-CM

## 2022-11-01 ENCOUNTER — Other Ambulatory Visit: Payer: Self-pay | Admitting: Emergency Medicine

## 2022-11-01 DIAGNOSIS — I10 Essential (primary) hypertension: Secondary | ICD-10-CM

## 2022-11-04 ENCOUNTER — Encounter: Payer: Self-pay | Admitting: Podiatry

## 2022-11-04 ENCOUNTER — Ambulatory Visit (INDEPENDENT_AMBULATORY_CARE_PROVIDER_SITE_OTHER): Payer: 59 | Admitting: Podiatry

## 2022-11-04 DIAGNOSIS — B351 Tinea unguium: Secondary | ICD-10-CM | POA: Diagnosis not present

## 2022-11-04 DIAGNOSIS — E1149 Type 2 diabetes mellitus with other diabetic neurological complication: Secondary | ICD-10-CM | POA: Diagnosis not present

## 2022-11-04 DIAGNOSIS — M79675 Pain in left toe(s): Secondary | ICD-10-CM | POA: Diagnosis not present

## 2022-11-04 DIAGNOSIS — E1140 Type 2 diabetes mellitus with diabetic neuropathy, unspecified: Secondary | ICD-10-CM

## 2022-11-04 DIAGNOSIS — Q828 Other specified congenital malformations of skin: Secondary | ICD-10-CM | POA: Diagnosis not present

## 2022-11-04 DIAGNOSIS — M79674 Pain in right toe(s): Secondary | ICD-10-CM

## 2022-11-04 NOTE — Progress Notes (Signed)
This patient returns to my office for at risk foot care.  This patient requires this care by a professional since this patient will be at risk due to having diabetes. This patient is unable to cut nails himself since the patient cannot reach his nails.These nails are painful walking and wearing shoes. Patient also has painful callus right foot. This patient presents for at risk foot care today.  General Appearance  Alert, conversant and in no acute stress.  Vascular  Dorsalis pedis and posterior tibial  pulses are palpable  bilaterally.  Capillary return is within normal limits  bilaterally. Temperature is within normal limits  bilaterally.  Neurologic  Senn-Weinstein monofilament wire test within normal limits  bilaterally. Muscle power within normal limits bilaterally.  Nails Thick disfigured discolored nails with subungual debris  from hallux to fifth toes bilaterally. No evidence of bacterial infection or drainage bilaterally.  Orthopedic  No limitations of motion  feet .  No crepitus or effusions noted.  No bony pathology or digital deformities noted.  Skin  normotropic skin noted bilaterally.  No signs of infections or ulcers noted.   Porokeratosis sub 3 right foot.  Onychomycosis  Pain in right toes  Pain in left toes  Porokeratosis sub 3 right foot.  Consent was obtained for treatment procedures.   Mechanical debridement of nails 1-5  bilaterally performed with a nail nipper.  Filed with dremel without incident. Debride porokeratosis with # 15 blade and dremel tool.   Return office visit    4 months                  Told patient to return for periodic foot care and evaluation due to potential at risk complications.   Helane Gunther DPM

## 2022-12-07 ENCOUNTER — Other Ambulatory Visit: Payer: Self-pay

## 2022-12-07 MED ORDER — AMLODIPINE BESYLATE 10 MG PO TABS: 10.0000 mg | ORAL_TABLET | Freq: Every day | ORAL | 0 refills | Status: DC

## 2022-12-13 ENCOUNTER — Other Ambulatory Visit: Payer: Self-pay | Admitting: Emergency Medicine

## 2023-01-12 ENCOUNTER — Other Ambulatory Visit: Payer: Self-pay

## 2023-01-12 MED ORDER — CLOPIDOGREL BISULFATE 75 MG PO TABS: 75.0000 mg | ORAL_TABLET | Freq: Every day | ORAL | 0 refills | Status: DC

## 2023-02-13 ENCOUNTER — Other Ambulatory Visit: Payer: Self-pay | Admitting: Cardiology

## 2023-02-18 ENCOUNTER — Other Ambulatory Visit: Payer: Self-pay | Admitting: Emergency Medicine

## 2023-02-18 DIAGNOSIS — E1159 Type 2 diabetes mellitus with other circulatory complications: Secondary | ICD-10-CM

## 2023-03-02 ENCOUNTER — Other Ambulatory Visit: Payer: Self-pay | Admitting: Cardiology

## 2023-03-03 ENCOUNTER — Other Ambulatory Visit: Payer: Self-pay | Admitting: Cardiology

## 2023-03-03 ENCOUNTER — Ambulatory Visit: Payer: 59 | Admitting: Podiatry

## 2023-03-10 ENCOUNTER — Other Ambulatory Visit: Payer: Self-pay | Admitting: Emergency Medicine

## 2023-03-26 ENCOUNTER — Other Ambulatory Visit: Payer: Self-pay | Admitting: Emergency Medicine

## 2023-03-26 DIAGNOSIS — I10 Essential (primary) hypertension: Secondary | ICD-10-CM

## 2023-04-11 ENCOUNTER — Ambulatory Visit: Payer: Self-pay | Admitting: Emergency Medicine

## 2023-04-17 ENCOUNTER — Ambulatory Visit: Payer: 59 | Admitting: Emergency Medicine

## 2023-04-29 ENCOUNTER — Other Ambulatory Visit: Payer: Self-pay | Admitting: Cardiology

## 2023-05-13 ENCOUNTER — Other Ambulatory Visit: Payer: Self-pay | Admitting: Cardiology

## 2023-05-16 ENCOUNTER — Other Ambulatory Visit: Payer: Self-pay | Admitting: Emergency Medicine

## 2023-05-16 ENCOUNTER — Encounter: Payer: Self-pay | Admitting: Emergency Medicine

## 2023-05-16 ENCOUNTER — Ambulatory Visit: Payer: 59 | Admitting: Emergency Medicine

## 2023-05-16 VITALS — BP 132/80 | HR 103 | Temp 98.8°F | Ht 70.0 in | Wt 192.2 lb

## 2023-05-16 DIAGNOSIS — E1169 Type 2 diabetes mellitus with other specified complication: Secondary | ICD-10-CM

## 2023-05-16 DIAGNOSIS — I152 Hypertension secondary to endocrine disorders: Secondary | ICD-10-CM

## 2023-05-16 DIAGNOSIS — Z720 Tobacco use: Secondary | ICD-10-CM

## 2023-05-16 DIAGNOSIS — Z7985 Long-term (current) use of injectable non-insulin antidiabetic drugs: Secondary | ICD-10-CM

## 2023-05-16 DIAGNOSIS — E785 Hyperlipidemia, unspecified: Secondary | ICD-10-CM

## 2023-05-16 DIAGNOSIS — Z7984 Long term (current) use of oral hypoglycemic drugs: Secondary | ICD-10-CM

## 2023-05-16 DIAGNOSIS — E1159 Type 2 diabetes mellitus with other circulatory complications: Secondary | ICD-10-CM

## 2023-05-16 LAB — POCT GLYCOSYLATED HEMOGLOBIN (HGB A1C): Hemoglobin A1C: 5.7 % — AB (ref 4.0–5.6)

## 2023-05-16 NOTE — Progress Notes (Addendum)
Clifford Frederick 57 y.o.   Chief Complaint  Patient presents with   Follow-up    6 month f/u for DM. Patient states his mom passed recently and missed appts .Marland Kitchen He's states he's been having a hard time coping she was all he had.     HISTORY OF PRESENT ILLNESS: This is a 57 y.o. male here for 4-month follow-up of hypertension and diabetes Recently lost his mother.  Still grieving. No other complaints or medical concerns today. Wt Readings from Last 3 Encounters:  05/16/23 192 lb 3.2 oz (87.2 kg)  10/25/22 202 lb 4 oz (91.7 kg)  07/19/22 204 lb (92.5 kg)     HPI   Prior to Admission medications   Medication Sig Start Date End Date Taking? Authorizing Provider  amLODipine (NORVASC) 10 MG tablet TAKE 1 TABLET BY MOUTH ONCE DAILY . APPOINTMENT REQUIRED FOR FUTURE REFILLS 03/02/23  Yes Quintella Reichert, MD  aspirin 81 MG chewable tablet Chew 1 tablet (81 mg total) by mouth daily. 08/18/17  Yes Berton Bon, NP  atorvastatin (LIPITOR) 80 MG tablet TAKE 1 TABLET BY MOUTH ONCE DAILY 6 IN THE EVENING 03/27/23  Yes Orazio Weller, Eilleen Kempf, MD  blood glucose meter kit and supplies Dispense based on patient and insurance preference. Use up to four times daily as directed. (FOR ICD-10 E10.9, E11.9). 06/04/18  Yes Peyton Najjar, MD  carvedilol (COREG) 25 MG tablet TAKE 1 TABLET BY MOUTH TWICE DAILY WITH MEALS 05/01/23  Yes Turner, Cornelious Bryant, MD  clopidogrel (PLAVIX) 75 MG tablet Take 1 tablet (75 mg total) by mouth daily. Please call [phone] to schedule an overdue appointment for future refills. Thank you. 2nd attempt. 02/13/23  Yes Turner, Cornelious Bryant, MD  cyclobenzaprine (FLEXERIL) 5 MG tablet Take 1 tablet (5 mg total) by mouth 3 (three) times daily as needed for muscle spasms. 08/28/20  Yes Janeece Agee, NP  Dulaglutide (TRULICITY) 1.5 MG/0.5ML SOPN INJECT 1.5 MG INTO THE SKIN  SUBCUTANEOUSLY ONCE A WEEK 07/19/22  Yes Sinahi Knights, Eilleen Kempf, MD  empagliflozin (JARDIANCE) 10 MG TABS tablet Take 1  tablet by mouth once daily 03/10/23  Yes Yeraldin Litzenberger, Eilleen Kempf, MD  lisinopril (ZESTRIL) 40 MG tablet Take 1 tablet by mouth once daily 08/25/22  Yes Kayode Petion, Eilleen Kempf, MD  metFORMIN (GLUCOPHAGE) 1000 MG tablet TAKE 1 TABLET BY MOUTH TWICE DAILY WITH MEALS 02/19/23  Yes Crystelle Ferrufino, Eilleen Kempf, MD  sildenafil (VIAGRA) 100 MG tablet Take 0.5-1 tablets (50-100 mg total) by mouth daily as needed for erectile dysfunction. 10/20/21  Yes Georgina Quint, MD    No Known Allergies  Patient Active Problem List   Diagnosis Date Noted   Porokeratosis 11/04/2022   Hypertension associated with type 2 diabetes mellitus (HCC) 07/17/2019   CAD in native artery    Pure hypercholesterolemia    Tobacco abuse 08/15/2017   Dyslipidemia associated with type 2 diabetes mellitus (HCC) 08/15/2017   Normocytic anemia 08/15/2017   Essential hypertension, benign 08/08/2012    Past Medical History:  Diagnosis Date   Abnormal EKG    Acute coronary syndrome (HCC) 08/15/2017   CAD in native artery    A. LHC 08/16/17- DES to RCA   Chest pain 08/15/2017   Diabetes mellitus type 2 in obese 08/15/2017   Essential hypertension, benign 08/08/2012   Fracture of lateral malleolus of left ankle 08/07/2012   High cholesterol    Hypertensive urgency 08/15/2017   Sleep apnea    Tobacco abuse 08/15/2017   Unstable  [facility])     Past Surgical History:  Procedure Laterality Date   CORONARY STENT INTERVENTION N/A 08/16/2017   Procedure: CORONARY STENT INTERVENTION;  Surgeon: Corky Crafts, MD;  Location: [facility] INVASIVE CV LAB;  Service: Cardiovascular;  Laterality: N/A;   LEFT HEART CATH AND CORONARY ANGIOGRAPHY N/A 08/16/2017   Procedure: LEFT HEART CATH AND CORONARY ANGIOGRAPHY;  Surgeon: Corky Crafts, MD;  Location: [facility] INVASIVE CV LAB;  Service: Cardiovascular;  Laterality: N/A;   ORIF ANKLE FRACTURE Left 08/07/2012   Procedure: OPEN REDUCTION INTERNAL FIXATION (ORIF) LEFT ANKLE FRACTURE;  Surgeon: Kathryne Hitch, MD;  Location: [facility] OR;  Service: Orthopedics;  Laterality: Left;    Social History   Socioeconomic History   Marital status: Married    Spouse name: Not on file   Number of children: Not on file   Years of education: Not on file   Highest education level: Not on file  Occupational History   Not on file  Tobacco Use   Smoking status: Some Days    Current packs/day: 0.25    Average packs/day: 0.2 packs/day for 20.0 years (5.0 ttl pk-yrs)    Types: Cigarettes    Start date: 08/16/2017   Smokeless tobacco: Never   Tobacco comments:    currently smokes 3 cigarettes a day  Vaping Use   Vaping status: Never Used  Substance and Sexual Activity   Alcohol use: Yes    Alcohol/week: 6.0 standard drinks of alcohol    Types: 6 Cans of beer per week    Comment: occ   Drug use: No   Sexual activity: Not on file  Other Topics Concern   Not on file  Social History Narrative   Not on file   Social Determinants of Health   Financial Resource Strain: Not on file  Food Insecurity: Not on file  Transportation Needs: Not on file  Physical Activity: Not on file  Stress: Not on file  Social Connections: Not on file  Intimate Partner Violence: Not on file    Family History  Problem Relation Age of Onset   Diabetes Mother    Hypertension Mother    Diabetes Brother    Hypertension Brother      Review of Systems  Constitutional: Negative.  Negative for chills and fever.  HENT: Negative.  Negative for congestion and sore throat.   Respiratory: Negative.  Negative for cough and shortness of breath.   Cardiovascular: Negative.  Negative for chest pain and palpitations.  Gastrointestinal:  Negative for abdominal pain, diarrhea, nausea and vomiting.  Genitourinary: Negative.  Negative for dysuria and hematuria.  Skin: Negative.  Negative for rash.  Neurological: Negative.  Negative for dizziness and headaches.  All other systems reviewed and are negative.   Vitals:    05/16/23 1001  BP: 132/80  Pulse: (!) 103  Temp: 98.8 F (37.1 C)  SpO2: 97%    Physical Exam Vitals reviewed.  Constitutional:      Appearance: Normal appearance.  HENT:     Head: Normocephalic.     Mouth/Throat:     Mouth: Mucous membranes are moist.     Pharynx: Oropharynx is clear.  Eyes:     Extraocular Movements: Extraocular movements intact.     Pupils: Pupils are equal, round, and reactive to light.  Cardiovascular:     Rate and Rhythm: Normal rate and regular rhythm.     Pulses: Normal pulses.     Heart sounds: Normal heart sounds.  Pulmonary:     Effort: Pulmonary effort is normal.     Breath sounds: Normal breath sounds.  Abdominal:     Palpations: Abdomen is soft.     Tenderness: There is no abdominal tenderness.  Musculoskeletal:     Cervical back: No tenderness.  Lymphadenopathy:     Cervical: No cervical adenopathy.  Skin:    General: Skin is warm and dry.     Capillary Refill: Capillary refill takes less than 2 seconds.  Neurological:     General: No focal deficit present.     Mental Status: He is alert and oriented to person, place, and time.  Psychiatric:        Mood and Affect: Mood normal.        Behavior: Behavior normal.    Results for orders placed or performed in visit on 05/16/23 (from the past 24 hour(s))  POCT HgB A1C     Status: Abnormal   Collection Time: 05/16/23 10:24 AM  Result Value Ref Range   Hemoglobin A1C 5.7 (A) 4.0 - 5.6 %   HbA1c POC (<> result, manual entry)     HbA1c, POC (prediabetic range)     HbA1c, POC (controlled diabetic range)       ASSESSMENT & PLAN: A total of 42 minutes was spent with the patient and counseling/coordination of care regarding preparing for this visit, review of most recent office visit notes, review of multiple chronic medical conditions and their management, cardiovascular risks associated with hypertension and diabetes, review of all medications, review of most recent bloodwork results  including interpretation of today's hemoglobin A1c, review of health maintenance items, education on nutrition, prognosis, documentation, and need for follow up.   Problem List Items Addressed This Visit       Cardiovascular and Mediastinum   Hypertension associated with type 2 diabetes mellitus (HCC) - Primary    Well-controlled hypertension Continue amlodipine 10 mg, lisinopril 40 mg, and carvedilol 25 mg twice a day Well-controlled diabetes with hemoglobin A1c of 5.7 Continue weekly Trulicity at 1.5 mg, Jardiance 10 mg, and metformin 1000 mg twice a day Cardiovascular risk associated with diabetes and hypertension discussed Diet and nutrition discussed Follow-up in 6 months        Endocrine   Dyslipidemia associated with type 2 diabetes mellitus (HCC)    Stable chronic condition Continue atorvastatin 80 mg daily Diet and nutrition discussed Benefit of exercise discussed      Relevant Orders   POCT HgB A1C (Completed)     Other   Tobacco abuse    Continues to smoke although less. Cardiovascular and cancer risks associated with smoking discussed Smoking cessation advice given        Patient Instructions  Health Maintenance, Male Adopting a healthy lifestyle and getting preventive care are important in promoting health and wellness. Ask your health care provider about: The right schedule for you to have regular tests and exams. Things you can do on your own to prevent diseases and keep yourself healthy. What should I know about diet, weight, and exercise? Eat a healthy diet  Eat a diet that includes plenty of vegetables, fruits, low-fat dairy products, and lean protein. Do not eat a lot of foods that are high in solid fats, added sugars, or sodium. Maintain a healthy weight Body mass index (BMI) is a measurement that can be used to identify possible weight problems. It estimates body fat based on height and weight. Your health care provider can help determine your  BMI and help you achieve or maintain a healthy weight. Get regular exercise Get regular exercise. This is one of the most important things you can do for your health. Most adults should: Exercise for at least 150 minutes each week. The exercise should increase your heart rate and make you sweat (moderate-intensity exercise). Do strengthening exercises at least twice a week. This is in addition to the moderate-intensity exercise. Spend less time sitting. Even light physical activity can be beneficial. Watch cholesterol and blood lipids Have your blood tested for lipids and cholesterol at 57 years of age, then have this test every 5 years. You may need to have your cholesterol levels checked more often if: Your lipid or cholesterol levels are high. You are older than 57 years of age. You are at high risk for heart disease. What should I know about cancer screening? Many types of cancers can be detected early and may often be prevented. Depending on your health history and family history, you may need to have cancer screening at various ages. This may include screening for: Colorectal cancer. Prostate cancer. Skin cancer. Lung cancer. What should I know about heart disease, diabetes, and high blood pressure? Blood pressure and heart disease High blood pressure causes heart disease and increases the risk of stroke. This is more likely to develop in people who have high blood pressure readings or are overweight. Talk with your health care provider about your target blood pressure readings. Have your blood pressure checked: Every 3-5 years if you are 93-57 years of age. Every year if you are 26 years old or older. If you are between the ages of 20 and 4 and are a current or former smoker, ask your health care provider if you should have a one-time screening for abdominal aortic aneurysm (AAA). Diabetes Have regular diabetes screenings. This checks your fasting blood sugar level. Have the  screening done: Once every three years after age 26 if you are at a normal weight and have a low risk for diabetes. More often and at a younger age if you are overweight or have a high risk for diabetes. What should I know about preventing infection? Hepatitis B If you have a higher risk for hepatitis B, you should be screened for this virus. Talk with your health care provider to find out if you are at risk for hepatitis B infection. Hepatitis C Blood testing is recommended for: Everyone born from [birth year] through 1965. Anyone with known risk factors for hepatitis C. Sexually transmitted infections (STIs) You should be screened each year for STIs, including gonorrhea and chlamydia, if: You are sexually active and are younger than 57 years of age. You are older than 57 years of age and your health care provider tells you that you are at risk for this type of infection. Your sexual activity has changed since you were last screened, and you are at increased risk for chlamydia or gonorrhea. Ask your health care provider if you are at risk. Ask your health care provider about whether you are at high risk for HIV. Your health care provider may recommend a prescription medicine to help prevent HIV infection. If you choose to take medicine to prevent HIV, you should first get tested for HIV. You should then be tested every 3 months for as long as you are taking the medicine. Follow these instructions at home: Alcohol use Do not drink alcohol if your health care provider tells you not to drink. If you drink alcohol: Limit  how much you have to 0-2 drinks a day. Know how much alcohol is in your drink. In the [place] one drink equals one 12 oz bottle of beer (355 mL), one 5 oz glass of wine (148 mL), or one 1 oz glass of hard liquor (44 mL). Lifestyle Do not use any products that contain nicotine or tobacco. These products include cigarettes, chewing tobacco, and vaping devices, such as e-cigarettes. If you  need help quitting, ask your health care provider. Do not use street drugs. Do not share needles. Ask your health care provider for help if you need support or information about quitting drugs. General instructions Schedule regular health, dental, and eye exams. Stay current with your vaccines. Tell your health care provider if: You often feel depressed. You have ever been abused or do not feel safe at home. Summary Adopting a healthy lifestyle and getting preventive care are important in promoting health and wellness. Follow your health care provider's instructions about healthy diet, exercising, and getting tested or screened for diseases. Follow your health care provider's instructions on monitoring your cholesterol and blood pressure. This information is not intended to replace advice given to you by your health care provider. Make sure you discuss any questions you have with your health care provider. Document Revised: 10/12/2020 Document Reviewed: 10/12/2020 Elsevier Patient Education  2024 Elsevier Inc.     Edwina Barth, MD [facility]

## 2023-05-16 NOTE — Assessment & Plan Note (Signed)
Stable chronic condition Continue atorvastatin 80 mg daily Diet and nutrition discussed Benefit of exercise discussed

## 2023-05-16 NOTE — Assessment & Plan Note (Signed)
Well-controlled hypertension Continue amlodipine 10 mg, lisinopril 40 mg, and carvedilol 25 mg twice a day Well-controlled diabetes with hemoglobin A1c of 5.7 Continue weekly Trulicity at 1.5 mg, Jardiance 10 mg, and metformin 1000 mg twice a day Cardiovascular risk associated with diabetes and hypertension discussed Diet and nutrition discussed Follow-up in 6 months

## 2023-05-16 NOTE — Assessment & Plan Note (Signed)
Continues to smoke although less. Cardiovascular and cancer risks associated with smoking discussed Smoking cessation advice given

## 2023-05-16 NOTE — Patient Instructions (Signed)
Health Maintenance, Male Adopting a healthy lifestyle and getting preventive care are important in promoting health and wellness. Ask your health care provider about: The right schedule for you to have regular tests and exams. Things you can do on your own to prevent diseases and keep yourself healthy. What should I know about diet, weight, and exercise? Eat a healthy diet  Eat a diet that includes plenty of vegetables, fruits, low-fat dairy products, and lean protein. Do not eat a lot of foods that are high in solid fats, added sugars, or sodium. Maintain a healthy weight Body mass index (BMI) is a measurement that can be used to identify possible weight problems. It estimates body fat based on height and weight. Your health care provider can help determine your BMI and help you achieve or maintain a healthy weight. Get regular exercise Get regular exercise. This is one of the most important things you can do for your health. Most adults should: Exercise for at least 150 minutes each week. The exercise should increase your heart rate and make you sweat (moderate-intensity exercise). Do strengthening exercises at least twice a week. This is in addition to the moderate-intensity exercise. Spend less time sitting. Even light physical activity can be beneficial. Watch cholesterol and blood lipids Have your blood tested for lipids and cholesterol at 57 years of age, then have this test every 5 years. You may need to have your cholesterol levels checked more often if: Your lipid or cholesterol levels are high. You are older than 57 years of age. You are at high risk for heart disease. What should I know about cancer screening? Many types of cancers can be detected early and may often be prevented. Depending on your health history and family history, you may need to have cancer screening at various ages. This may include screening for: Colorectal cancer. Prostate cancer. Skin cancer. Lung  cancer. What should I know about heart disease, diabetes, and high blood pressure? Blood pressure and heart disease High blood pressure causes heart disease and increases the risk of stroke. This is more likely to develop in people who have high blood pressure readings or are overweight. Talk with your health care provider about your target blood pressure readings. Have your blood pressure checked: Every 3-5 years if you are 18-39 years of age. Every year if you are 40 years old or older. If you are between the ages of 65 and 75 and are a current or former smoker, ask your health care provider if you should have a one-time screening for abdominal aortic aneurysm (AAA). Diabetes Have regular diabetes screenings. This checks your fasting blood sugar level. Have the screening done: Once every three years after age 45 if you are at a normal weight and have a low risk for diabetes. More often and at a younger age if you are overweight or have a high risk for diabetes. What should I know about preventing infection? Hepatitis B If you have a higher risk for hepatitis B, you should be screened for this virus. Talk with your health care provider to find out if you are at risk for hepatitis B infection. Hepatitis C Blood testing is recommended for: Everyone born from 1945 through 1965. Anyone with known risk factors for hepatitis C. Sexually transmitted infections (STIs) You should be screened each year for STIs, including gonorrhea and chlamydia, if: You are sexually active and are younger than 57 years of age. You are older than 57 years of age and your   health care provider tells you that you are at risk for this type of infection. Your sexual activity has changed since you were last screened, and you are at increased risk for chlamydia or gonorrhea. Ask your health care provider if you are at risk. Ask your health care provider about whether you are at high risk for HIV. Your health care provider  may recommend a prescription medicine to help prevent HIV infection. If you choose to take medicine to prevent HIV, you should first get tested for HIV. You should then be tested every 3 months for as long as you are taking the medicine. Follow these instructions at home: Alcohol use Do not drink alcohol if your health care provider tells you not to drink. If you drink alcohol: Limit how much you have to 0-2 drinks a day. Know how much alcohol is in your drink. In the [place] one drink equals one 12 oz bottle of beer (355 mL), one 5 oz glass of wine (148 mL), or one 1 oz glass of hard liquor (44 mL). Lifestyle Do not use any products that contain nicotine or tobacco. These products include cigarettes, chewing tobacco, and vaping devices, such as e-cigarettes. If you need help quitting, ask your health care provider. Do not use street drugs. Do not share needles. Ask your health care provider for help if you need support or information about quitting drugs. General instructions Schedule regular health, dental, and eye exams. Stay current with your vaccines. Tell your health care provider if: You often feel depressed. You have ever been abused or do not feel safe at home. Summary Adopting a healthy lifestyle and getting preventive care are important in promoting health and wellness. Follow your health care provider's instructions about healthy diet, exercising, and getting tested or screened for diseases. Follow your health care provider's instructions on monitoring your cholesterol and blood pressure. This information is not intended to replace advice given to you by your health care provider. Make sure you discuss any questions you have with your health care provider. Document Revised: 10/12/2020 Document Reviewed: 10/12/2020 Elsevier Patient Education  2024 Elsevier Inc.  

## 2023-05-17 ENCOUNTER — Other Ambulatory Visit: Payer: Self-pay

## 2023-05-17 MED ORDER — AMLODIPINE BESYLATE 10 MG PO TABS: 10.0000 mg | ORAL_TABLET | Freq: Every day | ORAL | 0 refills | Status: DC

## 2023-05-27 ENCOUNTER — Other Ambulatory Visit: Payer: Self-pay | Admitting: Cardiology

## 2023-05-29 ENCOUNTER — Telehealth: Payer: Self-pay | Admitting: Cardiology

## 2023-05-29 MED ORDER — CARVEDILOL 25 MG PO TABS: 25.0000 mg | ORAL_TABLET | Freq: Two times a day (BID) | ORAL | 1 refills | Status: DC

## 2023-05-29 NOTE — Telephone Encounter (Signed)
Pt's medication was sent to pt's pharmacy as requested. Confirmation received.  °

## 2023-05-29 NOTE — Telephone Encounter (Signed)
*  STAT* If patient is at the pharmacy, call can be transferred to refill team.   1. Which medications need to be refilled? (please list name of each medication and dose if known)   carvedilol (COREG) 25 MG tablet   2. Would you like to learn more about the convenience, safety, & potential cost savings by using the [facility]?   3. Are you open to using the [facility] (Type [facility]. ).  4. Which pharmacy/location (including street and city if local pharmacy) is medication to be sent to?  [id number] - [place] ([place]), [place] - [address]   5. Do they need a 30 day or 90 day supply?   90 day  Patient stated he is completely out of this medication.

## 2023-06-08 ENCOUNTER — Other Ambulatory Visit: Payer: Self-pay | Admitting: Cardiology

## 2023-06-20 ENCOUNTER — Other Ambulatory Visit: Payer: Self-pay | Admitting: Emergency Medicine

## 2023-06-20 DIAGNOSIS — I10 Essential (primary) hypertension: Secondary | ICD-10-CM

## 2023-06-25 ENCOUNTER — Other Ambulatory Visit: Payer: Self-pay | Admitting: Cardiology

## 2023-06-27 ENCOUNTER — Other Ambulatory Visit: Payer: Self-pay | Admitting: Cardiology

## 2023-07-04 ENCOUNTER — Other Ambulatory Visit: Payer: Self-pay | Admitting: Cardiology

## 2023-07-17 ENCOUNTER — Encounter: Payer: Self-pay | Admitting: Cardiology

## 2023-07-17 ENCOUNTER — Ambulatory Visit: Payer: 59 | Attending: Cardiology | Admitting: Cardiology

## 2023-07-17 VITALS — BP 140/82 | HR 100 | Ht 70.0 in | Wt 199.0 lb

## 2023-07-17 DIAGNOSIS — I2510 Atherosclerotic heart disease of native coronary artery without angina pectoris: Secondary | ICD-10-CM | POA: Diagnosis not present

## 2023-07-17 DIAGNOSIS — E7800 Pure hypercholesterolemia, unspecified: Secondary | ICD-10-CM | POA: Diagnosis not present

## 2023-07-17 DIAGNOSIS — Z79899 Other long term (current) drug therapy: Secondary | ICD-10-CM

## 2023-07-17 DIAGNOSIS — I10 Essential (primary) hypertension: Secondary | ICD-10-CM

## 2023-07-17 NOTE — Addendum Note (Signed)
 Addended by: Cherylyn Cos on: 07/17/2023 11:38 AM   Modules accepted: Orders

## 2023-07-17 NOTE — Progress Notes (Signed)
 Cardiology Office Note:    Date:  07/17/2023   ID:  BRESHAWN COLONA, DOB [birth year], MRN [id number]  PCP:  Elvira Hammersmith, MD  Cardiologist:  Gaylyn Keas, MD    Referring MD: Elvira Hammersmith, *   Chief Complaint  Patient presents with   Coronary Artery Disease   Hypertension   Hyperlipidemia    History of Present Illness:    Clifford Frederick is a 58 y.o. male with a hx of type 2 DM, hyperlipidemia, HTN, tobacco use and  CAD.  He presented 08/2017 with complaints of sudden onset of SOB while smoking a cigarette at work. Trop was negative.  Cath showed  80% distal RCA and 25% mid to proximal left circumflex and underwent drug-eluting Synergy stent to the RCA.  He had recurrent CP after smoking a cigarette and nuclear stress test was normal.   He was seen in the ER on 08/25/2020 due to chest pain after lifting 25lb metal pieces qith 50 repetitions.  The next morning he woke up with right sided pectoral soreness worse with movement of his arm.  In ER workup was normal and his sx were not felt to be cardiac.    He is here today for followup and is doing well.  HE denies any chest pain or pressure, SOB, DOE, PND, orthopnea, LE edema, dizziness, palpitations or syncope. He is compliant with his meds and is tolerating meds with no SE.    Past Medical History:  Diagnosis Date   Abnormal EKG    Acute coronary syndrome (HCC) 08/15/2017   CAD in native artery    A. LHC 08/16/17- DES to RCA   Chest pain 08/15/2017   Diabetes mellitus type 2 in obese 08/15/2017   Essential hypertension, benign 08/08/2012   Fracture of lateral malleolus of left ankle 08/07/2012   High cholesterol    Hypertensive urgency 08/15/2017   Sleep apnea    Tobacco abuse 08/15/2017   Unstable angina [facility])     Past Surgical History:  Procedure Laterality Date   CORONARY STENT INTERVENTION N/A 08/16/2017   Procedure: CORONARY STENT INTERVENTION;  Surgeon: Lucendia Rusk, MD;  Location: [facility] INVASIVE CV LAB;   Service: Cardiovascular;  Laterality: N/A;   LEFT HEART CATH AND CORONARY ANGIOGRAPHY N/A 08/16/2017   Procedure: LEFT HEART CATH AND CORONARY ANGIOGRAPHY;  Surgeon: Lucendia Rusk, MD;  Location: [facility] INVASIVE CV LAB;  Service: Cardiovascular;  Laterality: N/A;   ORIF ANKLE FRACTURE Left 08/07/2012   Procedure: OPEN REDUCTION INTERNAL FIXATION (ORIF) LEFT ANKLE FRACTURE;  Surgeon: Arnie Lao, MD;  Location: [facility] OR;  Service: Orthopedics;  Laterality: Left;    Current Medications: Current Meds  Medication Sig   amLODipine  (NORVASC ) 10 MG tablet Take 1 tablet (10 mg total) by mouth daily.   aspirin  81 MG chewable tablet Chew 1 tablet (81 mg total) by mouth daily.   atorvastatin  (LIPITOR ) 80 MG tablet TAKE 1 TABLET BY MOUTH ONCE DAILY 6 IN THE EVENING   blood glucose meter kit and supplies Dispense based on patient and insurance preference. Use up to four times daily as directed. (FOR ICD-10 E10.9, E11.9).   carvedilol  (COREG ) 25 MG tablet TAKE 1 TABLET BY MOUTH TWICE DAILY WITH A MEAL   clopidogrel  (PLAVIX ) 75 MG tablet Take 1 tablet (75 mg total) by mouth daily. Pt needs to keep upcoming appt in Feb with provider to avoid delay in refills - 1st attempt   cyclobenzaprine  (FLEXERIL ) 5 MG tablet Take  1 tablet (5 mg total) by mouth 3 (three) times daily as needed for muscle spasms.   Dulaglutide  (TRULICITY ) 1.5 MG/0.5ML SOPN INJECT 1.5 MG INTO THE SKIN  SUBCUTANEOUSLY ONCE A WEEK   empagliflozin  (JARDIANCE ) 10 MG TABS tablet Take 1 tablet by mouth once daily   lisinopril  (ZESTRIL ) 40 MG tablet Take 1 tablet by mouth once daily   metFORMIN  (GLUCOPHAGE ) 1000 MG tablet TAKE 1 TABLET BY MOUTH TWICE DAILY WITH MEALS   sildenafil  (VIAGRA ) 100 MG tablet Take 0.5-1 tablets (50-100 mg total) by mouth daily as needed for erectile dysfunction.     Allergies:   Patient has no known allergies.   Social History   Socioeconomic History   Marital status: Married    Spouse name: Not on file    Number of children: Not on file   Years of education: Not on file   Highest education level: Not on file  Occupational History   Not on file  Tobacco Use   Smoking status: Some Days    Current packs/day: 0.25    Average packs/day: 0.3 packs/day for 20.2 years (5.1 ttl pk-yrs)    Types: Cigarettes    Start date: 08/16/2017   Smokeless tobacco: Never   Tobacco comments:    currently smokes 3 cigarettes a day  Vaping Use   Vaping status: Never Used  Substance and Sexual Activity   Alcohol use: Yes    Alcohol/week: 6.0 standard drinks of alcohol    Types: 6 Cans of beer per week    Comment: occ   Drug use: No   Sexual activity: Not on file  Other Topics Concern   Not on file  Social History Narrative   Not on file   Social Drivers of Health   Financial Resource Strain: Not on file  Food Insecurity: Not on file  Transportation Needs: Not on file  Physical Activity: Not on file  Stress: Not on file  Social Connections: Not on file     Family History: The patient's family history includes Diabetes in his brother and mother; Hypertension in his brother and mother.  ROS:   Please see the history of present illness.    ROS  All other systems reviewed and negative.   EKGs/Labs/Other Studies Reviewed:    The following studies were reviewed today:  Nuclear stress test 2019 Nuclear stress EF: 50%. Blood pressure demonstrated a hypertensive response to exercise. Downsloping ST segment depression of 1 mm was noted during stress in the II, III and aVF leads, beginning at 9 minutes of stress. However, there is T wave inversion in these leads at baseline which limits the specificity of this finding. The study is normal. This is a low risk study. The left ventricular ejection fraction is mildly decreased (45-54%).  2D echo 08/2017 Study Conclusions   - Left ventricle: The cavity size was normal. There was moderate    concentric hypertrophy. Systolic function was normal. The     estimated ejection fraction was in the range of 60% to 65%. There    is hypokinesis of the apicalinferolateral myocardium. Features    are consistent with a pseudonormal left ventricular filling    pattern, with concomitant abnormal relaxation and increased    filling pressure (grade 2 diastolic dysfunction). Doppler    parameters are consistent with high ventricular filling pressure.  - Aortic valve: Trileaflet; mildly thickened, mildly calcified    leaflets.  - Mitral valve: Calcified annulus. There was trivial regurgitation.  - Left atrium:  The atrium was mildly dilated.  - Pulmonary arteries: Systolic pressure could not be accurately    estimated.   Cardiac Cath 08/2017 Conclusion    Dist RCA lesion is 80% stenosed. Prox Cx to Mid Cx lesion is 25% stenosed. There is mild to moderate left ventricular systolic dysfunction. There is no aortic valve stenosis. A drug-eluting stent was successfully placed using a STENT SYNERGY DES 3X16. Post intervention, there is a 0% residual stenosis.   Continue dual antiplatelet therapy with aspirin  and Brilinta  for 1 year along with aggressive secondary prevention.       Diagnostic Dominance: Right  Intervention       EKG Interpretation Date/Time:  Monday July 17 2023 10:53:55 EST Ventricular Rate:  100 PR Interval:  134 QRS Duration:  90 QT Interval:  328 QTC Calculation: 423 R Axis:   77  Text Interpretation: Normal sinus rhythm Abnormal QRS-T angle, consider primary T wave abnormality When compared with ECG of 25-Aug-2020 16:45, PREVIOUS ECG IS PRESENT No change compared to prior EKG except for increased heart rate Confirmed by Gaylyn Keas ([id number]) on 07/17/2023 11:15:38 AM    Recent Labs: 10/25/2022: ALT 20; BUN 11; Creatinine, Ser 0.97; Hemoglobin 13.8; Platelets 187.0; Potassium 3.8; Sodium 140   Recent Lipid Panel    Component Value Date/Time   CHOL 99 10/25/2022 0830   CHOL 116 01/13/2020 1032   TRIG 82.0  10/25/2022 0830   HDL 38.00 (L) 10/25/2022 0830   HDL 45 01/13/2020 1032   CHOLHDL 3 10/25/2022 0830   VLDL 16.4 10/25/2022 0830   LDLCALC 45 10/25/2022 0830   LDLCALC 55 01/13/2020 1032    Physical Exam:    VS:  BP (!) 140/82   Pulse 100   Ht 5\' 10"  (1.778 m)   Wt 199 lb (90.3 kg)   SpO2 98%   BMI 28.55 kg/m     Wt Readings from Last 3 Encounters:  07/17/23 199 lb (90.3 kg)  05/16/23 192 lb 3.2 oz (87.2 kg)  10/25/22 202 lb 4 oz (91.7 kg)    GEN: Well nourished, well developed in no acute distress HEENT: Normal NECK: No JVD; No carotid bruits LYMPHATICS: No lymphadenopathy CARDIAC:RRR, no murmurs, rubs, gallops RESPIRATORY:  Clear to auscultation without rales, wheezing or rhonchi  ABDOMEN: Soft, non-tender, non-distended MUSCULOSKELETAL:  No edema; No deformity  SKIN: Warm and dry NEUROLOGIC:  Alert and oriented x 3 PSYCHIATRIC:  Normal affect   ASSESSMENT:    1. CAD in native artery   2. Pure hypercholesterolemia   3. Essential hypertension, benign    PLAN:    In order of problems listed above:  1.  ASCAD  - cath showed  80% distal RCA and 25% mid to proximal left circumflex.   -He is status post drug-eluting Synergy stent to the RCA 08/16/2017.   -ETT for atypical chest pain 2021 showed no ischemia -He has not had any anginal symptoms since I saw him last -Continue prescription drug managed with aspirin  81 mg daily, amlodipine  10 mg daily, carvedilol  25 mg twice daily, atorvastatin  80 mg daily, Plavix  75 mg daily with as needed refills  2. Hyperlipidemia  -LDL goal < 70 -Check FLP and ALT -Continue drug management with Atorvastatin  80 Mg Daily with As Needed Refills  3.  HTN  -BP is adequate controlled on exam today -Continue prescription drug managed with amlodipine  10 mg daily, carvedilol  25 mg twice daily, lisinopril  40 mg daily with as needed refills -Check bmet today  Medication Adjustments/Labs and Tests Ordered: Current medicines are  reviewed at length with the patient today.  Concerns regarding medicines are outlined above.  Orders Placed This Encounter  Procedures   EKG 12-Lead   No orders of the defined types were placed in this encounter.   Signed, Gaylyn Keas, MD  07/17/2023 11:23 AM    [facility]

## 2023-07-17 NOTE — Patient Instructions (Signed)
 Medication Instructions:  Your physician recommends that you continue on your current medications as directed. Please refer to the Current Medication list given to you today.  *If you need a refill on your cardiac medications before your next appointment, please call your pharmacy*   Lab Work: Please complete a FASTING lipid panel and a CMET at any [facility] as soon as you can.  If you have labs (blood work) drawn today and your tests are completely normal, you will receive your results only by: MyChart Message (if you have MyChart) OR A paper copy in the mail If you have any lab test that is abnormal or we need to change your treatment, we will call you to review the results.   Testing/Procedures: None.   Follow-Up:  Your next appointment:   1 year(s)  Provider:   Gaylyn Keas, MD

## 2023-07-28 ENCOUNTER — Other Ambulatory Visit: Payer: Self-pay | Admitting: Cardiology

## 2023-08-04 ENCOUNTER — Other Ambulatory Visit: Payer: Self-pay | Admitting: Cardiology

## 2023-08-20 ENCOUNTER — Other Ambulatory Visit: Payer: Self-pay | Admitting: Emergency Medicine

## 2023-08-20 DIAGNOSIS — I10 Essential (primary) hypertension: Secondary | ICD-10-CM

## 2023-09-05 ENCOUNTER — Other Ambulatory Visit: Payer: Self-pay | Admitting: Cardiology

## 2023-10-09 ENCOUNTER — Other Ambulatory Visit: Payer: Self-pay | Admitting: Emergency Medicine

## 2023-10-09 DIAGNOSIS — E1159 Type 2 diabetes mellitus with other circulatory complications: Secondary | ICD-10-CM

## 2023-11-08 ENCOUNTER — Other Ambulatory Visit: Payer: Self-pay | Admitting: Emergency Medicine

## 2023-11-08 DIAGNOSIS — I10 Essential (primary) hypertension: Secondary | ICD-10-CM

## 2023-11-14 ENCOUNTER — Ambulatory Visit: Payer: 59 | Admitting: Emergency Medicine

## 2023-11-17 ENCOUNTER — Other Ambulatory Visit: Payer: Self-pay | Admitting: Emergency Medicine

## 2023-11-17 DIAGNOSIS — I10 Essential (primary) hypertension: Secondary | ICD-10-CM

## 2023-11-21 ENCOUNTER — Ambulatory Visit: Admitting: Emergency Medicine

## 2023-11-21 ENCOUNTER — Telehealth: Payer: Self-pay | Admitting: Pharmacist

## 2023-11-21 ENCOUNTER — Ambulatory Visit: Payer: Self-pay | Admitting: Emergency Medicine

## 2023-11-21 ENCOUNTER — Encounter: Payer: Self-pay | Admitting: Emergency Medicine

## 2023-11-21 VITALS — BP 126/78 | HR 100 | Temp 98.8°F | Ht 70.0 in | Wt 187.0 lb

## 2023-11-21 DIAGNOSIS — E1159 Type 2 diabetes mellitus with other circulatory complications: Secondary | ICD-10-CM

## 2023-11-21 DIAGNOSIS — E1169 Type 2 diabetes mellitus with other specified complication: Secondary | ICD-10-CM | POA: Diagnosis not present

## 2023-11-21 DIAGNOSIS — Z1159 Encounter for screening for other viral diseases: Secondary | ICD-10-CM

## 2023-11-21 DIAGNOSIS — Z7984 Long term (current) use of oral hypoglycemic drugs: Secondary | ICD-10-CM

## 2023-11-21 DIAGNOSIS — Z1211 Encounter for screening for malignant neoplasm of colon: Secondary | ICD-10-CM

## 2023-11-21 DIAGNOSIS — E785 Hyperlipidemia, unspecified: Secondary | ICD-10-CM

## 2023-11-21 DIAGNOSIS — Z720 Tobacco use: Secondary | ICD-10-CM

## 2023-11-21 DIAGNOSIS — I152 Hypertension secondary to endocrine disorders: Secondary | ICD-10-CM

## 2023-11-21 DIAGNOSIS — E1121 Type 2 diabetes mellitus with diabetic nephropathy: Secondary | ICD-10-CM

## 2023-11-21 DIAGNOSIS — Z7985 Long-term (current) use of injectable non-insulin antidiabetic drugs: Secondary | ICD-10-CM | POA: Diagnosis not present

## 2023-11-21 LAB — CBC WITH DIFFERENTIAL/PLATELET
Basophils Absolute: 0 10*3/uL (ref 0.0–0.1)
Basophils Relative: 0.4 % (ref 0.0–3.0)
Eosinophils Absolute: 0.2 10*3/uL (ref 0.0–0.7)
Eosinophils Relative: 2.2 % (ref 0.0–5.0)
HCT: 41.7 % (ref 39.0–52.0)
Hemoglobin: 13.7 g/dL (ref 13.0–17.0)
Lymphocytes Relative: 25 % (ref 12.0–46.0)
Lymphs Abs: 1.8 10*3/uL (ref 0.7–4.0)
MCHC: 32.9 g/dL (ref 30.0–36.0)
MCV: 89.5 fl (ref 78.0–100.0)
Monocytes Absolute: 0.4 10*3/uL (ref 0.1–1.0)
Monocytes Relative: 6.3 % (ref 3.0–12.0)
Neutro Abs: 4.6 10*3/uL (ref 1.4–7.7)
Neutrophils Relative %: 66.1 % (ref 43.0–77.0)
Platelets: 189 10*3/uL (ref 150.0–400.0)
RBC: 4.67 Mil/uL (ref 4.22–5.81)
RDW: 15.5 % (ref 11.5–15.5)
WBC: 7 10*3/uL (ref 4.0–10.5)

## 2023-11-21 LAB — LIPID PANEL
Cholesterol: 118 mg/dL (ref 0–200)
HDL: 37 mg/dL — ABNORMAL LOW (ref >39.00)
LDL Cholesterol: 65 mg/dL (ref 0–99)
NonHDL: 80.95
Total CHOL/HDL Ratio: 3
Triglycerides: 79 mg/dL (ref 0.0–149.0)
VLDL: 15.8 mg/dL (ref 0.0–40.0)

## 2023-11-21 LAB — COMPREHENSIVE METABOLIC PANEL WITH GFR
ALT: 16 U/L (ref 0–53)
AST: 20 U/L (ref 0–37)
Albumin: 4.3 g/dL (ref 3.5–5.2)
Alkaline Phosphatase: 42 U/L (ref 39–117)
BUN: 13 mg/dL (ref 6–23)
CO2: 29 meq/L (ref 19–32)
Calcium: 9.7 mg/dL (ref 8.4–10.5)
Chloride: 103 meq/L (ref 96–112)
Creatinine, Ser: 1.34 mg/dL (ref 0.40–1.50)
GFR: 58.7 mL/min — ABNORMAL LOW (ref >60.00)
Glucose, Bld: 102 mg/dL — ABNORMAL HIGH (ref 70–99)
Potassium: 4 meq/L (ref 3.5–5.1)
Sodium: 142 meq/L (ref 135–145)
Total Bilirubin: 0.6 mg/dL (ref 0.2–1.2)
Total Protein: 7 g/dL (ref 6.0–8.3)

## 2023-11-21 LAB — MICROALBUMIN / CREATININE URINE RATIO
Creatinine,U: 88.8 mg/dL
Microalb Creat Ratio: 31.4 mg/g — ABNORMAL HIGH (ref 0.0–30.0)
Microalb, Ur: 2.8 mg/dL — ABNORMAL HIGH (ref 0.0–1.9)

## 2023-11-21 LAB — HEMOGLOBIN A1C: Hgb A1c MFr Bld: 6.1 % (ref 4.6–6.5)

## 2023-11-21 MED ORDER — KERENDIA 10 MG PO TABS: 10.0000 mg | ORAL_TABLET | Freq: Every day | ORAL | 3 refills | Status: AC

## 2023-11-21 NOTE — Telephone Encounter (Signed)
 Pharmacy Patient Advocate Encounter   Received notification from Patient Pharmacy that prior authorization for Kerendia 10MG  tablets is required/requested.   Insurance verification completed.   The patient is insured through [facility] .   Per test claim: PA required; PA submitted to above mentioned insurance via CoverMyMeds Key/confirmation #/EOC [id number] Status is pending

## 2023-11-21 NOTE — Patient Instructions (Signed)
 Health Maintenance, Male  Adopting a healthy lifestyle and getting preventive care are important in promoting health and wellness. Ask your health care provider about:  The right schedule for you to have regular tests and exams.  Things you can do on your own to prevent diseases and keep yourself healthy.  What should I know about diet, weight, and exercise?  Eat a healthy diet    Eat a diet that includes plenty of vegetables, fruits, low-fat dairy products, and lean protein.  Do not eat a lot of foods that are high in solid fats, added sugars, or sodium.  Maintain a healthy weight  Body mass index (BMI) is a measurement that can be used to identify possible weight problems. It estimates body fat based on height and weight. Your health care provider can help determine your BMI and help you achieve or maintain a healthy weight.  Get regular exercise  Get regular exercise. This is one of the most important things you can do for your health. Most adults should:  Exercise for at least 150 minutes each week. The exercise should increase your heart rate and make you sweat (moderate-intensity exercise).  Do strengthening exercises at least twice a week. This is in addition to the moderate-intensity exercise.  Spend less time sitting. Even light physical activity can be beneficial.  Watch cholesterol and blood lipids  Have your blood tested for lipids and cholesterol at 58 years of age, then have this test every 5 years.  You may need to have your cholesterol levels checked more often if:  Your lipid or cholesterol levels are high.  You are older than 58 years of age.  You are at high risk for heart disease.  What should I know about cancer screening?  Many types of cancers can be detected early and may often be prevented. Depending on your health history and family history, you may need to have cancer screening at various ages. This may include screening for:  Colorectal cancer.  Prostate cancer.  Skin cancer.  Lung  cancer.  What should I know about heart disease, diabetes, and high blood pressure?  Blood pressure and heart disease  High blood pressure causes heart disease and increases the risk of stroke. This is more likely to develop in people who have high blood pressure readings or are overweight.  Talk with your health care provider about your target blood pressure readings.  Have your blood pressure checked:  Every 3-5 years if you are 9-95 years of age.  Every year if you are 85 years old or older.  If you are between the ages of 29 and 29 and are a current or former smoker, ask your health care provider if you should have a one-time screening for abdominal aortic aneurysm (AAA).  Diabetes  Have regular diabetes screenings. This checks your fasting blood sugar level. Have the screening done:  Once every three years after age 23 if you are at a normal weight and have a low risk for diabetes.  More often and at a younger age if you are overweight or have a high risk for diabetes.  What should I know about preventing infection?  Hepatitis B  If you have a higher risk for hepatitis B, you should be screened for this virus. Talk with your health care provider to find out if you are at risk for hepatitis B infection.  Hepatitis C  Blood testing is recommended for:  Everyone born from [birth year] through 1965.  Anyone  with known risk factors for hepatitis C.  Sexually transmitted infections (STIs)  You should be screened each year for STIs, including gonorrhea and chlamydia, if:  You are sexually active and are younger than 58 years of age.  You are older than 58 years of age and your health care provider tells you that you are at risk for this type of infection.  Your sexual activity has changed since you were last screened, and you are at increased risk for chlamydia or gonorrhea. Ask your health care provider if you are at risk.  Ask your health care provider about whether you are at high risk for HIV. Your health care provider  may recommend a prescription medicine to help prevent HIV infection. If you choose to take medicine to prevent HIV, you should first get tested for HIV. You should then be tested every 3 months for as long as you are taking the medicine.  Follow these instructions at home:  Alcohol use  Do not drink alcohol if your health care provider tells you not to drink.  If you drink alcohol:  Limit how much you have to 0-2 drinks a day.  Know how much alcohol is in your drink. In the [place] one drink equals one 12 oz bottle of beer (355 mL), one 5 oz glass of wine (148 mL), or one 1 oz glass of hard liquor (44 mL).  Lifestyle  Do not use any products that contain nicotine or tobacco. These products include cigarettes, chewing tobacco, and vaping devices, such as e-cigarettes. If you need help quitting, ask your health care provider.  Do not use street drugs.  Do not share needles.  Ask your health care provider for help if you need support or information about quitting drugs.  General instructions  Schedule regular health, dental, and eye exams.  Stay current with your vaccines.  Tell your health care provider if:  You often feel depressed.  You have ever been abused or do not feel safe at home.  Summary  Adopting a healthy lifestyle and getting preventive care are important in promoting health and wellness.  Follow your health care provider's instructions about healthy diet, exercising, and getting tested or screened for diseases.  Follow your health care provider's instructions on monitoring your cholesterol and blood pressure.  This information is not intended to replace advice given to you by your health care provider. Make sure you discuss any questions you have with your health care provider.  Document Revised: 10/12/2020 Document Reviewed: 10/12/2020  Elsevier Patient Education  2024 [employer].

## 2023-11-21 NOTE — Assessment & Plan Note (Signed)
Stable chronic condition Continue atorvastatin 80 mg daily Diet and nutrition discussed Benefit of exercise discussed

## 2023-11-21 NOTE — Telephone Encounter (Signed)
 Pharmacy Patient Advocate Encounter  Received notification from [facility] that Prior Authorization for Kerendia 10MG  tablets has been APPROVED from 11/21/2023 to 11/20/2024   PA #/Case ID/Reference #: [id number]

## 2023-11-21 NOTE — Assessment & Plan Note (Addendum)
 Well-controlled hypertension Continue amlodipine  10 mg, lisinopril  40 mg, and carvedilol  25 mg twice a day Well-controlled diabetes with hemoglobin A1c of 6.1 Continue weekly Trulicity  at 1.5 mg, Jardiance  10 mg, and metformin  1000 mg twice a day Cardiovascular risk associated with diabetes and hypertension discussed Diet and nutrition discussed Follow-up in 6 months

## 2023-11-21 NOTE — Progress Notes (Addendum)
 Clifford Frederick 58 y.o.   Chief Complaint  Patient presents with   Follow-up    6 month f/u for HTN / DM. Patient states no other concerns     HISTORY OF PRESENT ILLNESS: This is a 58 y.o. male here for 3-month follow-up of chronic medical conditions including diabetes and hypertension Overall doing well.  Has no complaints or medical concerns today. Lab Results  Component Value Date   HGBA1C 5.7 (A) 05/16/2023   Wt Readings from Last 3 Encounters:  11/21/23 187 lb (84.8 kg)  07/17/23 199 lb (90.3 kg)  05/16/23 192 lb 3.2 oz (87.2 kg)   BP Readings from Last 3 Encounters:  11/21/23 126/78  07/17/23 (!) 140/82  05/16/23 132/80     HPI   Prior to Admission medications   Medication Sig Start Date End Date Taking? Authorizing Provider  amLODipine  (NORVASC ) 10 MG tablet Take 1 tablet by mouth once daily 08/04/23  Yes Turner, Rufus Council, MD  aspirin  81 MG chewable tablet Chew 1 tablet (81 mg total) by mouth daily. 08/18/17  Yes Hammond, Janine, NP  atorvastatin  (LIPITOR ) 80 MG tablet TAKE 1 TABLET BY MOUTH ONCE DAILY 6 IN THE EVENING 11/09/23  Yes Tierra Divelbiss, Isidro Margo, MD  blood glucose meter kit and supplies Dispense based on patient and insurance preference. Use up to four times daily as directed. (FOR ICD-10 E10.9, E11.9). 06/04/18  Yes Arloa Lamas, MD  carvedilol  (COREG ) 25 MG tablet TAKE 1 TABLET BY MOUTH TWICE DAILY WITH A MEAL 07/28/23  Yes Turner, Traci R, MD  clopidogrel  (PLAVIX ) 75 MG tablet TAKE 1 TABLET BY MOUTH ONCE DAILY. PLEASE CALL [phone] TO SCHEDULE AN OVERDUE APPOINTMENT FOR FUTURE REFILLS. THANK YOU. 2ND ATTEMPT 09/06/23  Yes Turner, Rufus Council, MD  cyclobenzaprine  (FLEXERIL ) 5 MG tablet Take 1 tablet (5 mg total) by mouth 3 (three) times daily as needed for muscle spasms. 08/28/20  Yes Ulyess Gammons, NP  Dulaglutide  (TRULICITY ) 1.5 MG/0.5ML SOAJ INJECT 1.5 MG SUBCUTANEOUSLY  ONCE A WEEK 10/09/23  Yes Muzamil Harker, Isidro Margo, MD  empagliflozin  (JARDIANCE ) 10 MG TABS  tablet Take 1 tablet by mouth once daily 03/10/23  Yes Reynalda Canny Jose, MD  lisinopril  (ZESTRIL ) 40 MG tablet Take 1 tablet by mouth once daily 11/17/23  Yes Kaydenn Mclear Jose, MD  metFORMIN  (GLUCOPHAGE ) 1000 MG tablet TAKE 1 TABLET BY MOUTH TWICE DAILY WITH MEALS 05/16/23  Yes Caylor Tallarico, Isidro Margo, MD  sildenafil  (VIAGRA ) 100 MG tablet Take 0.5-1 tablets (50-100 mg total) by mouth daily as needed for erectile dysfunction. 10/20/21  Yes Elvira Hammersmith, MD    No Known Allergies  Patient Active Problem List   Diagnosis Date Noted   Porokeratosis 11/04/2022   Hypertension associated with type 2 diabetes mellitus (HCC) 07/17/2019   CAD in native artery    Pure hypercholesterolemia    Tobacco abuse 08/15/2017   Dyslipidemia associated with type 2 diabetes mellitus (HCC) 08/15/2017   Normocytic anemia 08/15/2017   Essential hypertension, benign 08/08/2012    Past Medical History:  Diagnosis Date   Abnormal EKG    Acute coronary syndrome (HCC) 08/15/2017   CAD in native artery    A. LHC 08/16/17- DES to RCA   Chest pain 08/15/2017   Diabetes mellitus type 2 in obese 08/15/2017   Essential hypertension, benign 08/08/2012   Fracture of lateral malleolus of left ankle 08/07/2012   High cholesterol    Hypertensive urgency 08/15/2017   Sleep apnea    Tobacco abuse  08/15/2017   Unstable angina [facility])     Past Surgical History:  Procedure Laterality Date   CORONARY STENT INTERVENTION N/A 08/16/2017   Procedure: CORONARY STENT INTERVENTION;  Surgeon: Lucendia Rusk, MD;  Location: [facility] INVASIVE CV LAB;  Service: Cardiovascular;  Laterality: N/A;   LEFT HEART CATH AND CORONARY ANGIOGRAPHY N/A 08/16/2017   Procedure: LEFT HEART CATH AND CORONARY ANGIOGRAPHY;  Surgeon: Lucendia Rusk, MD;  Location: [facility] INVASIVE CV LAB;  Service: Cardiovascular;  Laterality: N/A;   ORIF ANKLE FRACTURE Left 08/07/2012   Procedure: OPEN REDUCTION INTERNAL FIXATION (ORIF) LEFT ANKLE FRACTURE;   Surgeon: Arnie Lao, MD;  Location: [facility] OR;  Service: Orthopedics;  Laterality: Left;    Social History   Socioeconomic History   Marital status: Married    Spouse name: Not on file   Number of children: Not on file   Years of education: Not on file   Highest education level: Not on file  Occupational History   Not on file  Tobacco Use   Smoking status: Some Days    Current packs/day: 0.25    Average packs/day: 0.3 packs/day for 20.6 years (5.1 ttl pk-yrs)    Types: Cigarettes    Start date: 08/16/2017   Smokeless tobacco: Never   Tobacco comments:    currently smokes 3 cigarettes a day  Vaping Use   Vaping status: Never Used  Substance and Sexual Activity   Alcohol use: Yes    Alcohol/week: 6.0 standard drinks of alcohol    Types: 6 Cans of beer per week    Comment: occ   Drug use: No   Sexual activity: Not on file  Other Topics Concern   Not on file  Social History Narrative   Not on file   Social Drivers of Health   Financial Resource Strain: Not on file  Food Insecurity: Not on file  Transportation Needs: Not on file  Physical Activity: Not on file  Stress: Not on file  Social Connections: Not on file  Intimate Partner Violence: Not on file    Family History  Problem Relation Age of Onset   Diabetes Mother    Hypertension Mother    Diabetes Brother    Hypertension Brother      Review of Systems  Constitutional: Negative.  Negative for chills and fever.  HENT: Negative.  Negative for congestion and sore throat.   Respiratory: Negative.  Negative for cough and shortness of breath.   Cardiovascular: Negative.  Negative for chest pain and palpitations.  Gastrointestinal:  Negative for abdominal pain, diarrhea, nausea and vomiting.  Genitourinary: Negative.  Negative for dysuria and hematuria.  Skin: Negative.  Negative for rash.  Neurological: Negative.  Negative for dizziness and headaches.  All other systems reviewed and are  negative.   Vitals:   11/21/23 0806  BP: 126/78  Pulse: 100  Temp: 98.8 F (37.1 C)  SpO2: 97%    Physical Exam Vitals reviewed.  Constitutional:      Appearance: Normal appearance.  HENT:     Head: Normocephalic.     Mouth/Throat:     Mouth: Mucous membranes are moist.     Pharynx: Oropharynx is clear.   Eyes:     Extraocular Movements: Extraocular movements intact.     Conjunctiva/sclera: Conjunctivae normal.     Pupils: Pupils are equal, round, and reactive to light.    Cardiovascular:     Rate and Rhythm: Normal rate and regular rhythm.  Pulses: Normal pulses.     Heart sounds: Normal heart sounds.  Pulmonary:     Effort: Pulmonary effort is normal.     Breath sounds: Normal breath sounds.   Musculoskeletal:     Cervical back: No tenderness.  Lymphadenopathy:     Cervical: No cervical adenopathy.   Skin:    General: Skin is warm and dry.     Capillary Refill: Capillary refill takes less than 2 seconds.   Neurological:     General: No focal deficit present.     Mental Status: He is alert and oriented to person, place, and time.   Psychiatric:        Mood and Affect: Mood normal.        Behavior: Behavior normal.      ASSESSMENT & PLAN: A total of 44 minutes was spent with the patient and counseling/coordination of care regarding preparing for this visit, review of most recent office visit notes, review of multiple chronic medical conditions and their management, review of all medications, review of most recent bloodwork results, review of health maintenance items, education on nutrition, prognosis, documentation, and need for follow up.   Problem List Items Addressed This Visit       Cardiovascular and Mediastinum   Hypertension associated with type 2 diabetes mellitus (HCC) - Primary   Well-controlled hypertension Continue amlodipine  10 mg, lisinopril  40 mg, and carvedilol  25 mg twice a day Well-controlled diabetes with hemoglobin A1c of  6.1 Continue weekly Trulicity  at 1.5 mg, Jardiance  10 mg, and metformin  1000 mg twice a day Cardiovascular risk associated with diabetes and hypertension discussed Diet and nutrition discussed Follow-up in 6 months      Relevant Orders   Comprehensive metabolic panel with GFR (Completed)   CBC with Differential/Platelet (Completed)   Hemoglobin A1c (Completed)   Lipid panel (Completed)   Microalbumin / creatinine urine ratio (Completed)     Endocrine   Dyslipidemia associated with type 2 diabetes mellitus (HCC)   Stable chronic condition Continue atorvastatin  80 mg daily Diet and nutrition discussed Benefit of exercise discussed      Relevant Orders   Comprehensive metabolic panel with GFR (Completed)   CBC with Differential/Platelet (Completed)   Hemoglobin A1c (Completed)   Lipid panel (Completed)   Microalbumin / creatinine urine ratio (Completed)     Other   Tobacco abuse   Continues to smoke although less. Cardiovascular and cancer risks associated with smoking discussed Smoking cessation advice given      Relevant Orders   CBC with Differential/Platelet (Completed)   Other Visit Diagnoses       Need for hepatitis C screening test       Relevant Orders   Hepatitis C antibody     Screening for colon cancer       Relevant Orders   Cologuard        Patient Instructions  Health Maintenance, Male Adopting a healthy lifestyle and getting preventive care are important in promoting health and wellness. Ask your health care provider about: The right schedule for you to have regular tests and exams. Things you can do on your own to prevent diseases and keep yourself healthy. What should I know about diet, weight, and exercise? Eat a healthy diet  Eat a diet that includes plenty of vegetables, fruits, low-fat dairy products, and lean protein. Do not eat a lot of foods that are high in solid fats, added sugars, or sodium. Maintain a healthy weight Body mass  index (  BMI) is a measurement that can be used to identify possible weight problems. It estimates body fat based on height and weight. Your health care provider can help determine your BMI and help you achieve or maintain a healthy weight. Get regular exercise Get regular exercise. This is one of the most important things you can do for your health. Most adults should: Exercise for at least 150 minutes each week. The exercise should increase your heart rate and make you sweat (moderate-intensity exercise). Do strengthening exercises at least twice a week. This is in addition to the moderate-intensity exercise. Spend less time sitting. Even light physical activity can be beneficial. Watch cholesterol and blood lipids Have your blood tested for lipids and cholesterol at 58 years of age, then have this test every 5 years. You may need to have your cholesterol levels checked more often if: Your lipid or cholesterol levels are high. You are older than 58 years of age. You are at high risk for heart disease. What should I know about cancer screening? Many types of cancers can be detected early and may often be prevented. Depending on your health history and family history, you may need to have cancer screening at various ages. This may include screening for: Colorectal cancer. Prostate cancer. Skin cancer. Lung cancer. What should I know about heart disease, diabetes, and high blood pressure? Blood pressure and heart disease High blood pressure causes heart disease and increases the risk of stroke. This is more likely to develop in people who have high blood pressure readings or are overweight. Talk with your health care provider about your target blood pressure readings. Have your blood pressure checked: Every 3-5 years if you are 70-4 years of age. Every year if you are 75 years old or older. If you are between the ages of 49 and 40 and are a current or former smoker, ask your health care  provider if you should have a one-time screening for abdominal aortic aneurysm (AAA). Diabetes Have regular diabetes screenings. This checks your fasting blood sugar level. Have the screening done: Once every three years after age 67 if you are at a normal weight and have a low risk for diabetes. More often and at a younger age if you are overweight or have a high risk for diabetes. What should I know about preventing infection? Hepatitis B If you have a higher risk for hepatitis B, you should be screened for this virus. Talk with your health care provider to find out if you are at risk for hepatitis B infection. Hepatitis C Blood testing is recommended for: Everyone born from [birth year] through 1965. Anyone with known risk factors for hepatitis C. Sexually transmitted infections (STIs) You should be screened each year for STIs, including gonorrhea and chlamydia, if: You are sexually active and are younger than 58 years of age. You are older than 58 years of age and your health care provider tells you that you are at risk for this type of infection. Your sexual activity has changed since you were last screened, and you are at increased risk for chlamydia or gonorrhea. Ask your health care provider if you are at risk. Ask your health care provider about whether you are at high risk for HIV. Your health care provider may recommend a prescription medicine to help prevent HIV infection. If you choose to take medicine to prevent HIV, you should first get tested for HIV. You should then be tested every 3 months for as long as you  are taking the medicine. Follow these instructions at home: Alcohol use Do not drink alcohol if your health care provider tells you not to drink. If you drink alcohol: Limit how much you have to 0-2 drinks a day. Know how much alcohol is in your drink. In the [place] one drink equals one 12 oz bottle of beer (355 mL), one 5 oz glass of wine (148 mL), or one 1 oz glass of hard  liquor (44 mL). Lifestyle Do not use any products that contain nicotine  or tobacco. These products include cigarettes, chewing tobacco, and vaping devices, such as e-cigarettes. If you need help quitting, ask your health care provider. Do not use street drugs. Do not share needles. Ask your health care provider for help if you need support or information about quitting drugs. General instructions Schedule regular health, dental, and eye exams. Stay current with your vaccines. Tell your health care provider if: You often feel depressed. You have ever been abused or do not feel safe at home. Summary Adopting a healthy lifestyle and getting preventive care are important in promoting health and wellness. Follow your health care provider's instructions about healthy diet, exercising, and getting tested or screened for diseases. Follow your health care provider's instructions on monitoring your cholesterol and blood pressure. This information is not intended to replace advice given to you by your health care provider. Make sure you discuss any questions you have with your health care provider. Document Revised: 10/12/2020 Document Reviewed: 10/12/2020 Elsevier Patient Education  2024 [employer]    Maryagnes Small, MD [facility]

## 2023-11-21 NOTE — Assessment & Plan Note (Signed)
Continues to smoke although less. Cardiovascular and cancer risks associated with smoking discussed Smoking cessation advice given

## 2023-11-22 LAB — HEPATITIS C ANTIBODY: Hepatitis C Ab: NONREACTIVE

## 2023-12-06 ENCOUNTER — Other Ambulatory Visit: Payer: Self-pay | Admitting: Emergency Medicine

## 2024-01-08 ENCOUNTER — Other Ambulatory Visit: Payer: Self-pay | Admitting: Emergency Medicine

## 2024-01-08 DIAGNOSIS — I152 Hypertension secondary to endocrine disorders: Secondary | ICD-10-CM

## 2024-02-03 ENCOUNTER — Other Ambulatory Visit: Payer: Self-pay | Admitting: Emergency Medicine

## 2024-02-03 DIAGNOSIS — I10 Essential (primary) hypertension: Secondary | ICD-10-CM

## 2024-02-12 ENCOUNTER — Other Ambulatory Visit: Payer: Self-pay | Admitting: Emergency Medicine

## 2024-02-12 DIAGNOSIS — I10 Essential (primary) hypertension: Secondary | ICD-10-CM

## 2024-04-06 ENCOUNTER — Other Ambulatory Visit: Payer: Self-pay | Admitting: Emergency Medicine

## 2024-04-06 DIAGNOSIS — E1159 Type 2 diabetes mellitus with other circulatory complications: Secondary | ICD-10-CM

## 2024-05-05 ENCOUNTER — Other Ambulatory Visit: Payer: Self-pay | Admitting: Emergency Medicine

## 2024-05-05 DIAGNOSIS — I10 Essential (primary) hypertension: Secondary | ICD-10-CM

## 2024-05-22 ENCOUNTER — Ambulatory Visit: Admitting: Emergency Medicine

## 2024-05-29 ENCOUNTER — Other Ambulatory Visit: Payer: Self-pay | Admitting: Emergency Medicine

## 2024-05-29 DIAGNOSIS — I10 Essential (primary) hypertension: Secondary | ICD-10-CM

## 2024-07-02 ENCOUNTER — Other Ambulatory Visit: Payer: Self-pay | Admitting: Emergency Medicine

## 2024-07-02 DIAGNOSIS — E1159 Type 2 diabetes mellitus with other circulatory complications: Secondary | ICD-10-CM
